# Patient Record
Sex: Female | Born: 1990 | Race: Black or African American | Hispanic: No | Marital: Single | Smoking: Never smoker
Health system: Southern US, Community
[De-identification: ages and names within clinical notes are randomized; demographics above are authoritative.]

## PROBLEM LIST (undated history)

## (undated) ENCOUNTER — Emergency Department (HOSPITAL_BASED_OUTPATIENT_CLINIC_OR_DEPARTMENT_OTHER): Admission: EM | Payer: No Typology Code available for payment source | Source: Home / Self Care

## (undated) DIAGNOSIS — F419 Anxiety disorder, unspecified: Secondary | ICD-10-CM

## (undated) DIAGNOSIS — D649 Anemia, unspecified: Secondary | ICD-10-CM

## (undated) DIAGNOSIS — J40 Bronchitis, not specified as acute or chronic: Secondary | ICD-10-CM

## (undated) HISTORY — PX: NO PAST SURGERIES: SHX2092

## (undated) HISTORY — DX: Anxiety disorder, unspecified: F41.9

---

## 2008-04-26 ENCOUNTER — Emergency Department (HOSPITAL_COMMUNITY): Admission: EM | Admit: 2008-04-26 | Discharge: 2008-04-26 | Payer: Self-pay | Admitting: Emergency Medicine

## 2009-08-05 ENCOUNTER — Emergency Department (HOSPITAL_COMMUNITY): Admission: EM | Admit: 2009-08-05 | Discharge: 2009-08-06 | Payer: Self-pay | Admitting: Emergency Medicine

## 2009-11-20 ENCOUNTER — Emergency Department (HOSPITAL_COMMUNITY): Admission: EM | Admit: 2009-11-20 | Discharge: 2009-11-20 | Payer: Self-pay | Admitting: Emergency Medicine

## 2010-06-26 ENCOUNTER — Emergency Department (HOSPITAL_COMMUNITY)
Admission: EM | Admit: 2010-06-26 | Discharge: 2010-06-26 | Payer: Self-pay | Source: Home / Self Care | Admitting: Emergency Medicine

## 2010-09-12 IMAGING — US US OB TRANSVAGINAL MODIFY
1 series · 14 of 28 positions shown · non-contrast
Comparison: none

CLINICAL DATA: Back pain.

OBSTETRIC <14 WK US AND TRANSVAGINAL OB US
TECHNIQUE: Both transabdominal and transvaginal ultrasound
examinations were performed for complete evaluation of the
gestation as well as the maternal uterus, adnexal regions, and
pelvic cul-de-sac.

[Series 1: us ob transvaginal modify · 0.26mm/px · 14 of 37 slices shown]
[im 2/37]
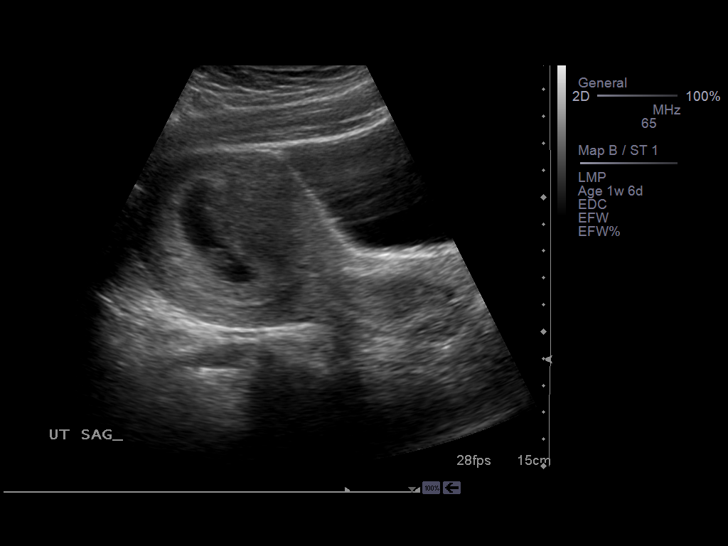
[im 5/37]
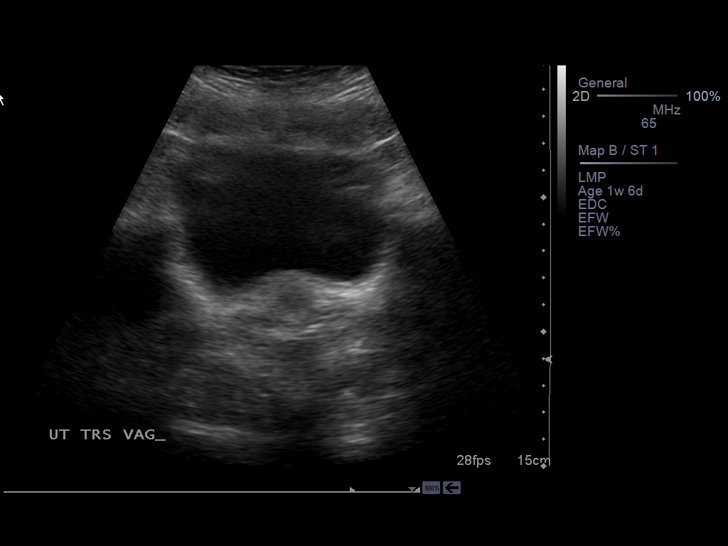
[im 7/37]
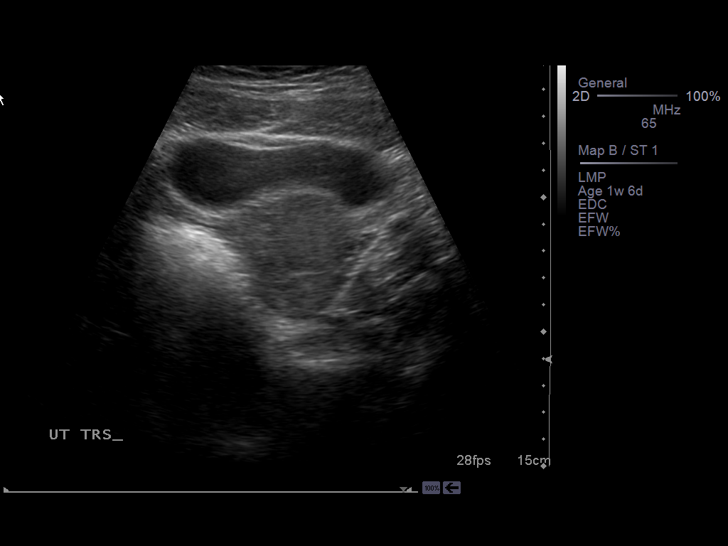
[im 10/37]
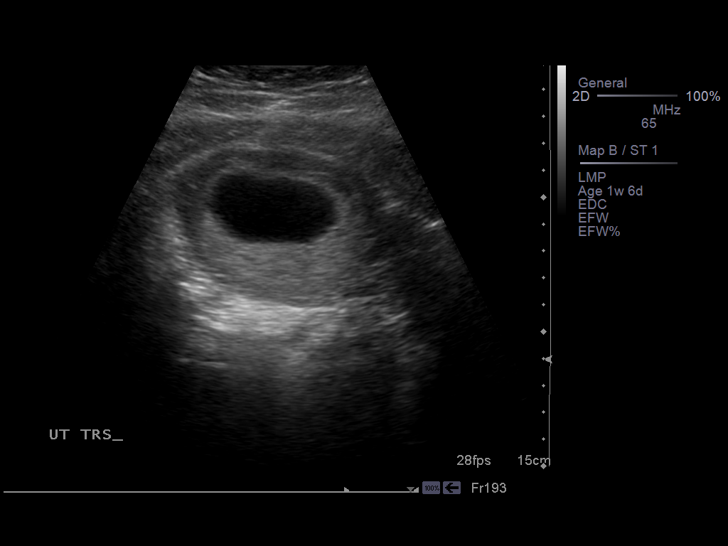
[im 13/37]
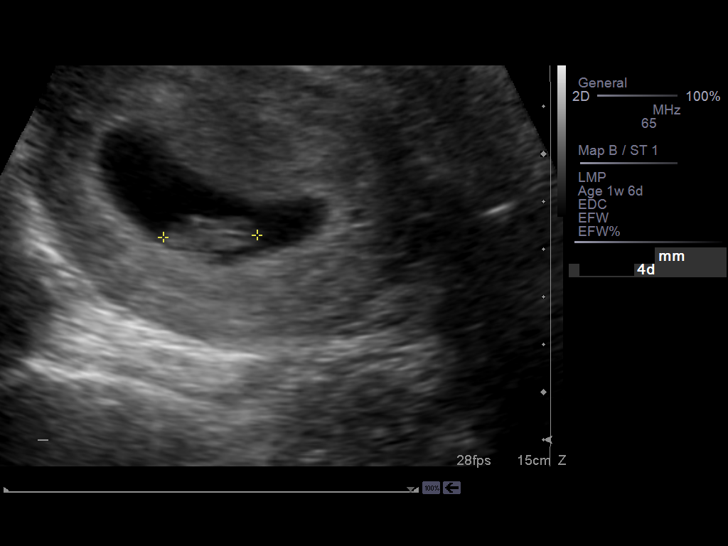
[im 15/37]
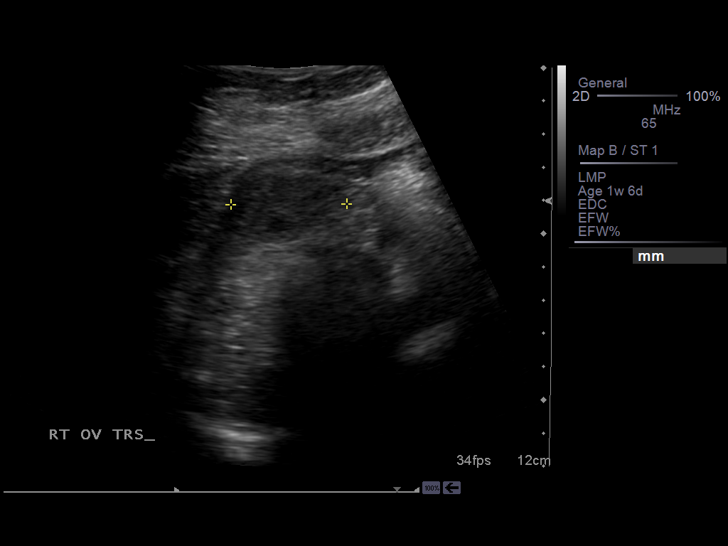
[im 18/37]
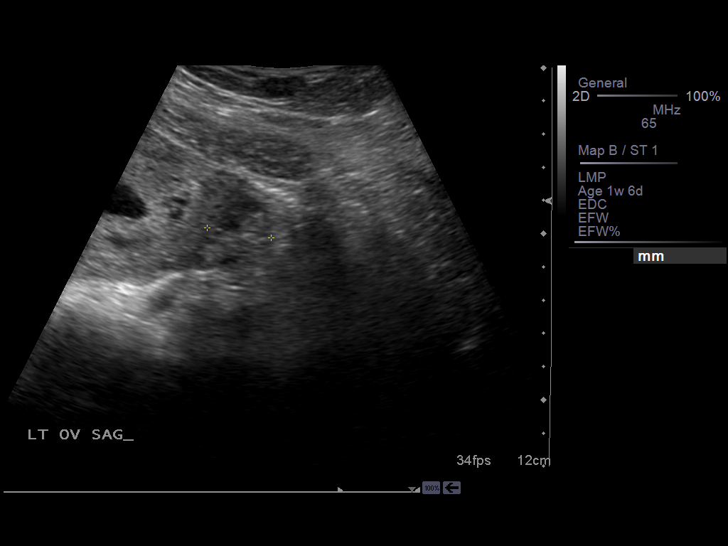
[im 21/37]
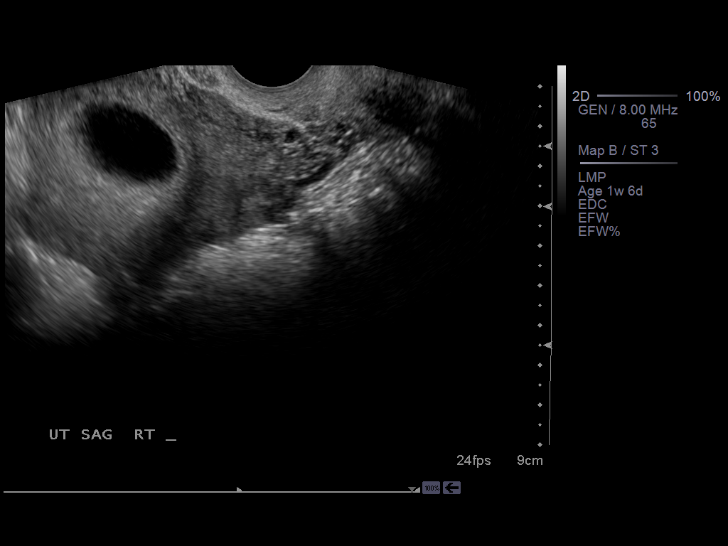
[im 23/37]
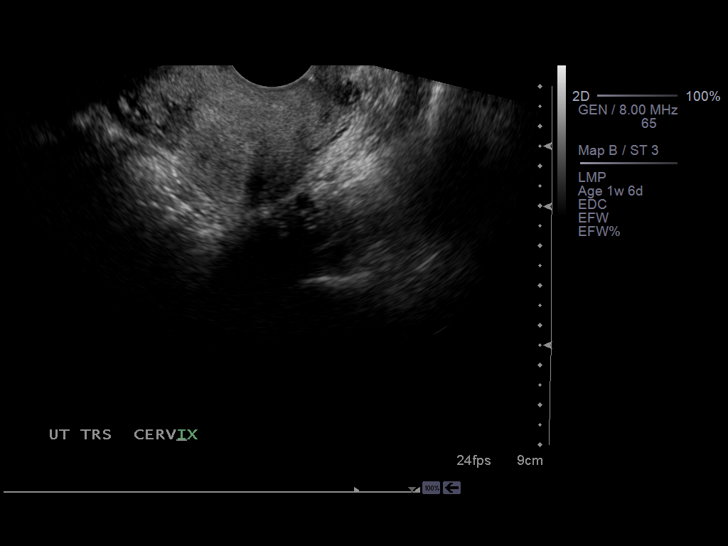
[im 26/37]
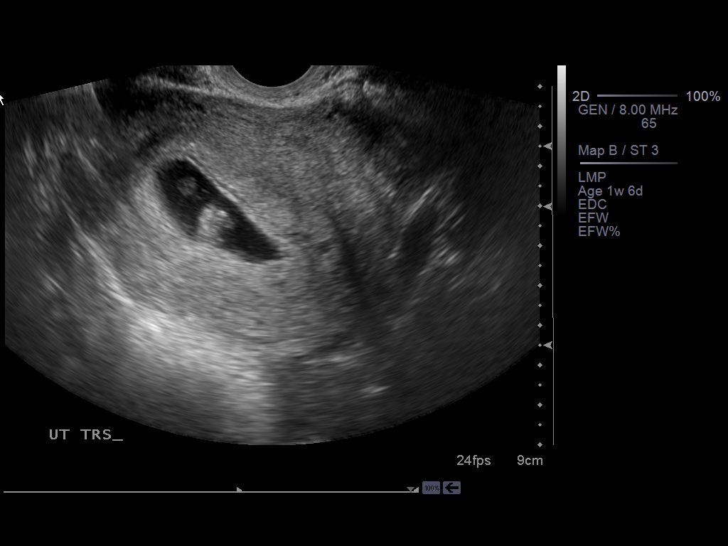
[im 29/37]
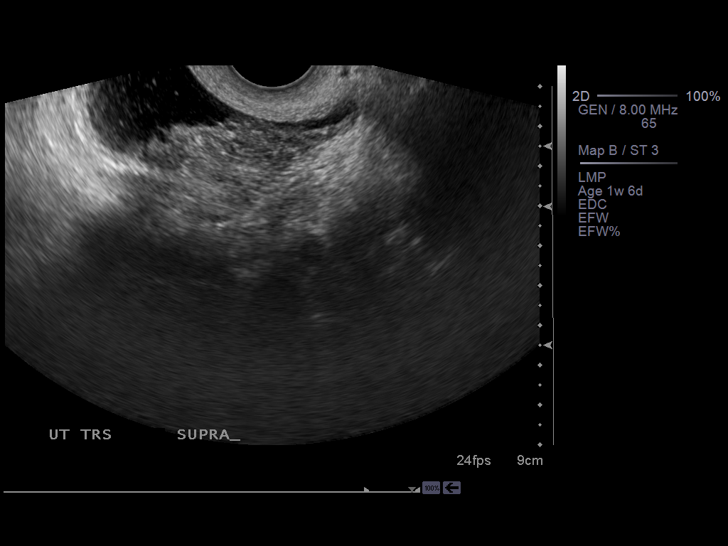
[im 31/37]
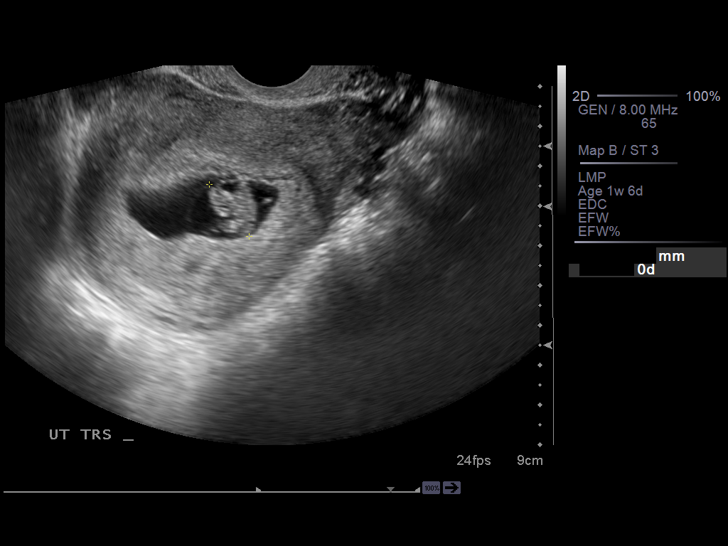
[im 34/37]
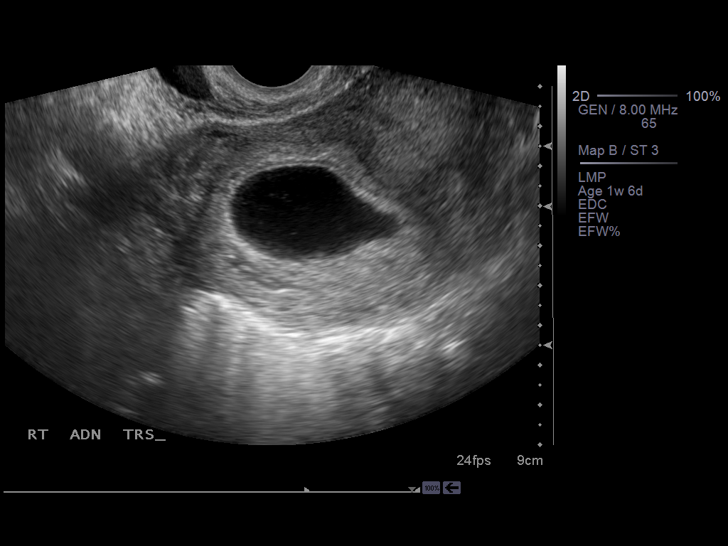
[im 37/37]
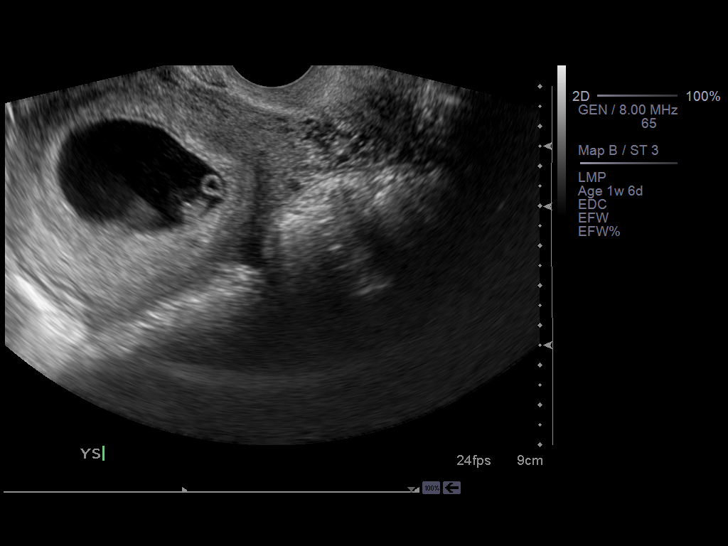

[14 of 28 positions shown; findings below may reference images not displayed]

FINDINGS: Quantitative beta HCG not available.  Single intrauterine
gestational sac is identified with yolk sac, embryo, and cardiac
activity of 180 beats per minute.  Crown-rump length is 17.6 mm,
corresponding with 8 weeks 2 days.

The right ovary measures 4.9 x 2.3 x 3.5 cm.  The left ovary
measures 5.1 x 3.2 x 1.0 cm.

No free fluid is present in the anatomic pelvis.
IMPRESSION: Single viable intrauterine pregnancy with estimated gestational age
8 weeks 2 days. Follow-up fetal 20-week anatomic ultrasound
recommended, or sooner if clinically indicated.

## 2011-03-24 LAB — URINE MICROSCOPIC-ADD ON

## 2011-03-24 LAB — PREGNANCY, URINE

## 2011-03-24 LAB — URINALYSIS, ROUTINE W REFLEX MICROSCOPIC
Bilirubin Urine: NEGATIVE
Glucose, UA: NEGATIVE mg/dL
Hgb urine dipstick: NEGATIVE
Ketones, ur: NEGATIVE mg/dL
Nitrite: NEGATIVE
Protein, ur: NEGATIVE mg/dL
Specific Gravity, Urine: 1.003 — ABNORMAL LOW (ref 1.005–1.030)
Urobilinogen, UA: 1 mg/dL (ref 0.0–1.0)
pH: 7.5 (ref 5.0–8.0)

## 2011-03-24 LAB — RAPID STREP SCREEN (MED CTR MEBANE ONLY): Streptococcus, Group A Screen (Direct): NEGATIVE

## 2011-03-28 LAB — URINALYSIS, ROUTINE W REFLEX MICROSCOPIC
Bilirubin Urine: NEGATIVE
Glucose, UA: NEGATIVE mg/dL
Hgb urine dipstick: NEGATIVE
Ketones, ur: NEGATIVE mg/dL
Nitrite: NEGATIVE
Protein, ur: NEGATIVE mg/dL
Specific Gravity, Urine: 1.008 (ref 1.005–1.030)
Urobilinogen, UA: 1 mg/dL (ref 0.0–1.0)
pH: 7 (ref 5.0–8.0)

## 2011-03-28 LAB — URINE MICROSCOPIC-ADD ON

## 2011-03-28 LAB — HCG, QUANTITATIVE, PREGNANCY

## 2011-03-28 LAB — POCT PREGNANCY, URINE

## 2011-11-27 ENCOUNTER — Encounter: Payer: Self-pay | Admitting: Physical Medicine and Rehabilitation

## 2011-11-27 ENCOUNTER — Emergency Department (HOSPITAL_COMMUNITY)
Admission: EM | Admit: 2011-11-27 | Discharge: 2011-11-28 | Disposition: A | Discharge status: Home / Self Care | Attending: Emergency Medicine | Admitting: Emergency Medicine

## 2011-11-27 DIAGNOSIS — R0602 Shortness of breath: Secondary | ICD-10-CM | POA: Insufficient documentation

## 2011-11-27 DIAGNOSIS — R05 Cough: Secondary | ICD-10-CM | POA: Insufficient documentation

## 2011-11-27 DIAGNOSIS — R070 Pain in throat: Secondary | ICD-10-CM | POA: Insufficient documentation

## 2011-11-27 DIAGNOSIS — R0789 Other chest pain: Secondary | ICD-10-CM | POA: Insufficient documentation

## 2011-11-27 DIAGNOSIS — J40 Bronchitis, not specified as acute or chronic: Secondary | ICD-10-CM

## 2011-11-27 DIAGNOSIS — R059 Cough, unspecified: Secondary | ICD-10-CM | POA: Insufficient documentation

## 2011-11-27 MED ORDER — ALBUTEROL SULFATE HFA 108 (90 BASE) MCG/ACT IN AERS
2.0000 | INHALATION_SPRAY | RESPIRATORY_TRACT | Status: DC | PRN
  Administered 2011-11-28: 2 via RESPIRATORY_TRACT
  Filled 2011-11-27: qty 6.7

## 2011-11-27 MED ORDER — ALBUTEROL SULFATE (5 MG/ML) 0.5% IN NEBU
5.0000 mg | INHALATION_SOLUTION | Freq: Once | RESPIRATORY_TRACT | Status: AC
  Administered 2011-11-28: 5 mg via RESPIRATORY_TRACT
  Filled 2011-11-27: qty 1

## 2011-11-27 MED ORDER — IPRATROPIUM BROMIDE 0.02 % IN SOLN
0.5000 mg | Freq: Once | RESPIRATORY_TRACT | Status: AC
  Administered 2011-11-28: 0.5 mg via RESPIRATORY_TRACT
  Filled 2011-11-27: qty 2.5

## 2011-11-27 NOTE — ED Notes (Signed)
Pt reports that she has had a non-productive cough and slight fever (been taking tylenol) for several days.  Reports that she has also had some lower bilateral abdominal pain x several weeks.  States that she is non-tender on palpation.  Pt not noted to be coughing, breath sounds clear bilaterally.  Skin warm, dry and intact.  Neuro intact.

## 2011-11-27 NOTE — ED Provider Notes (Signed)
History     CSN: [id number] Arrival date & time: 11/27/2011  8:11 PM   First MD Initiated Contact with Patient 11/27/11 2317      Chief Complaint  Patient presents with  . Cough    HPI  History provided by the patient. Patient with past history of asthma presents with complaints of dry nonproductive cough, sore throat and chest tightness this been waxing and waning for the past week. Patient reports having a coworker with similar respiratory symptoms. Patient has tried some over-the-counter cough medicine without any significant improvement. Patient denies any aggravating or alleviating factors. She denies any fever, chills, sweats, chest pain, hemoptysis. Patient denies any nausea vomiting diarrhea. She has no other significant past medical history.  History reviewed. No pertinent past medical history.  History reviewed. No pertinent past surgical history.  History reviewed. No pertinent family history.  History  Substance Use Topics  . Smoking status: Never Smoker   . Smokeless tobacco: Not on file  . Alcohol Use: Yes    OB History    Grav Para Term Preterm Abortions TAB SAB Ect Mult Living                  Review of Systems  Constitutional: Negative for fever, chills and diaphoresis.  HENT: Positive for sore throat. Negative for congestion and rhinorrhea.   Respiratory: Positive for cough, chest tightness, shortness of breath and wheezing.   Cardiovascular: Negative for chest pain.  Gastrointestinal: Negative for nausea, vomiting, diarrhea and constipation.  Genitourinary: Negative for dysuria, hematuria and flank pain.  Neurological: Negative for light-headedness.  All other systems reviewed and are negative.    Allergies  Keflex  Home Medications  No current outpatient prescriptions on file.  BP 105/65  Pulse 86  Temp(Src) 99 F (37.2 C) (Oral)  Resp 18  SpO2 98%  Physical Exam  Nursing note and vitals reviewed. Constitutional: She is oriented to  person, place, and time. She appears well-developed and well-nourished. No distress.  HENT:  Head: Normocephalic.  Mouth/Throat: Oropharynx is clear and moist.  Eyes: Conjunctivae and EOM are normal. Pupils are equal, round, and reactive to light.  Neck: Normal range of motion. Neck supple.  Cardiovascular: Normal rate, regular rhythm and normal heart sounds.   Pulmonary/Chest: Effort normal. No respiratory distress. She has wheezes. She has no rales.  Abdominal: Soft. Bowel sounds are normal. There is no tenderness. There is no rebound and no guarding.  Musculoskeletal: She exhibits no edema and no tenderness.  Lymphadenopathy:    She has no cervical adenopathy.  Neurological: She is alert and oriented to person, place, and time.  Skin: Skin is warm and dry.  Psychiatric: She has a normal mood and affect. Her behavior is normal.    ED Course  Procedures (including critical care time)   1. Bronchitis      MDM  11:50 PM patient seen and evaluated. Patient in no acute distress. Patient with normal respirations and good O2 sats. Patient is PERC negative        Angus Seller, [place] 11/28/11 1850

## 2011-11-27 NOTE — ED Notes (Signed)
Pt presents to department for evaluation of cough and fever. Ongoing for several days. Also states chest congestion. Respirations unlabored. Skin warm and dry. No signs of distress at the time.

## 2011-11-28 ENCOUNTER — Emergency Department (HOSPITAL_COMMUNITY)
Admission: EM | Admit: 2011-11-28 | Discharge: 2011-11-28 | Disposition: A | Discharge status: Home / Self Care | Attending: Emergency Medicine | Admitting: Emergency Medicine

## 2011-11-28 ENCOUNTER — Encounter (HOSPITAL_COMMUNITY): Payer: Self-pay | Admitting: *Deleted

## 2011-11-28 ENCOUNTER — Encounter (HOSPITAL_COMMUNITY): Payer: Self-pay | Admitting: Nurse Practitioner

## 2011-11-28 DIAGNOSIS — J40 Bronchitis, not specified as acute or chronic: Secondary | ICD-10-CM | POA: Insufficient documentation

## 2011-11-28 DIAGNOSIS — J45909 Unspecified asthma, uncomplicated: Secondary | ICD-10-CM | POA: Insufficient documentation

## 2011-11-28 DIAGNOSIS — R0989 Other specified symptoms and signs involving the circulatory and respiratory systems: Secondary | ICD-10-CM | POA: Insufficient documentation

## 2011-11-28 DIAGNOSIS — R05 Cough: Secondary | ICD-10-CM | POA: Insufficient documentation

## 2011-11-28 DIAGNOSIS — R059 Cough, unspecified: Secondary | ICD-10-CM | POA: Insufficient documentation

## 2011-11-28 DIAGNOSIS — R509 Fever, unspecified: Secondary | ICD-10-CM | POA: Insufficient documentation

## 2011-11-28 HISTORY — DX: Bronchitis, not specified as acute or chronic: J40

## 2011-11-28 HISTORY — DX: Anemia, unspecified: D64.9

## 2011-11-28 MED ORDER — AZITHROMYCIN 250 MG PO TABS: ORAL_TABLET | ORAL | Status: AC

## 2011-11-28 NOTE — ED Provider Notes (Signed)
History     CSN: [id number] Arrival date & time: 11/28/2011  9:53 AM   First MD Initiated Contact with Patient 11/28/11 1057      Chief Complaint  Patient presents with  . URI    (Consider location/radiation/quality/duration/timing/severity/associated sxs/prior treatment) Patient is a 20 y.o. female presenting with URI. The history is provided by the patient.  URI The primary symptoms include fever and cough. Primary symptoms do not include headaches, abdominal pain or vomiting.  The illness is not associated with rhinorrhea.   Pt notes hx asthma and bronchitis. States in past week, prod cough, occasional wheezing. No current sob. No chest pain. Notes fevers. No chills/sweats. States w coughing spells has scratchy throat, otherwise no sore throat. No nasal drainage or sinus pain. No headache. No known ill contacts. Seen in ed yesterday, states fever and cough persist. No hx [place].  Past Medical History  Diagnosis Date  . Asthma   . Anemia   . Bronchitis     History reviewed. No pertinent past surgical history.  History reviewed. No pertinent family history.  History  Substance Use Topics  . Smoking status: Never Smoker   . Smokeless tobacco: Not on file  . Alcohol Use: Yes     rare    OB History    Grav Para Term Preterm Abortions TAB SAB Ect Mult Living                  Review of Systems  Constitutional: Positive for fever.  HENT: Negative for rhinorrhea.   Respiratory: Positive for cough.   Cardiovascular: Negative for chest pain and leg swelling.  Gastrointestinal: Negative for vomiting, abdominal pain and diarrhea.  Genitourinary: Negative for dysuria.  Musculoskeletal: Negative for back pain.  Neurological: Negative for headaches.  Hematological: Negative for adenopathy.    Allergies  Keflex  Home Medications  No current outpatient prescriptions on file.  BP 109/63  Pulse 97  Temp(Src) 98.5 F (36.9 C) (Oral)  Resp 18  Ht 5\' 5"  (1.651 m)  Wt 201  lb (91.173 kg)  BMI 33.45 kg/m2  SpO2 96%  Physical Exam  Nursing note and vitals reviewed. Constitutional: She is oriented to person, place, and time. She appears well-developed and well-nourished. No distress.  HENT:  Right Ear: External ear normal.  Left Ear: External ear normal.  Nose: Nose normal.  Mouth/Throat: Oropharynx is clear and moist.  Eyes: Conjunctivae are normal. No scleral icterus.  Neck: Neck supple. No tracheal deviation present.  Cardiovascular: Normal rate, regular rhythm, normal heart sounds and intact distal pulses.  Exam reveals no gallop and no friction rub.   No murmur heard. Pulmonary/Chest: Effort normal. No respiratory distress. She has no wheezes.       Coughing, upper resp congestion  Abdominal: Soft. Normal appearance. She exhibits no distension. There is no tenderness.  Musculoskeletal: She exhibits no edema and no tenderness.  Lymphadenopathy:    She has no cervical adenopathy.  Neurological: She is alert and oriented to person, place, and time.  Skin: Skin is warm and dry. No rash noted.  Psychiatric: She has a normal mood and affect.    ED Course  Procedures (including critical care time)     MDM  Will rx bronchitis. Confirmed only allergy is keflex. Has inhaler at home.        Suzi Roots, MD 11/28/11 [phone]

## 2011-11-28 NOTE — ED Notes (Signed)
Pt reports sob, chest congestion, body aches, chills since Monday. Pt came last night and was diagnosed with bronchitis but states she is not feeling any better and feels she may need antibiotics. A&Ox4, breathing easily

## 2011-11-29 NOTE — ED Provider Notes (Signed)
Medical screening examination/treatment/procedure(s) were performed by non-physician practitioner and as supervising physician I was immediately available for consultation/collaboration.   Glynn Octave, MD 11/29/11 [phone]

## 2011-12-05 ENCOUNTER — Inpatient Hospital Stay (HOSPITAL_COMMUNITY)
Admission: AD | Admit: 2011-12-05 | Discharge: 2011-12-05 | Disposition: A | Discharge status: Home / Self Care | Source: Ambulatory Visit | Attending: Obstetrics and Gynecology | Admitting: Obstetrics and Gynecology

## 2011-12-05 ENCOUNTER — Inpatient Hospital Stay (HOSPITAL_COMMUNITY)

## 2011-12-05 ENCOUNTER — Encounter (HOSPITAL_COMMUNITY): Payer: Self-pay | Admitting: *Deleted

## 2011-12-05 DIAGNOSIS — O26899 Other specified pregnancy related conditions, unspecified trimester: Secondary | ICD-10-CM

## 2011-12-05 DIAGNOSIS — N949 Unspecified condition associated with female genital organs and menstrual cycle: Secondary | ICD-10-CM

## 2011-12-05 DIAGNOSIS — R1032 Left lower quadrant pain: Secondary | ICD-10-CM | POA: Insufficient documentation

## 2011-12-05 DIAGNOSIS — R102 Pelvic and perineal pain: Secondary | ICD-10-CM

## 2011-12-05 DIAGNOSIS — O99891 Other specified diseases and conditions complicating pregnancy: Secondary | ICD-10-CM | POA: Insufficient documentation

## 2011-12-05 LAB — CBC
HCT: 34.8 % — ABNORMAL LOW (ref 36.0–46.0)
Hemoglobin: 12.1 g/dL (ref 12.0–15.0)
MCH: 27.5 pg (ref 26.0–34.0)
MCHC: 34.8 g/dL (ref 30.0–36.0)
MCV: 79.1 fL (ref 78.0–100.0)
Platelets: 289 10*3/uL (ref 150–400)
RBC: 4.4 MIL/uL (ref 3.87–5.11)
RDW: 14.6 % (ref 11.5–15.5)
WBC: 13.9 10*3/uL — ABNORMAL HIGH (ref 4.0–10.5)

## 2011-12-05 LAB — WET PREP, GENITAL
Clue Cells Wet Prep HPF POC: NONE SEEN
Trich, Wet Prep: NONE SEEN
Yeast Wet Prep HPF POC: NONE SEEN

## 2011-12-05 LAB — ABO/RH: ABO/RH(D): A POS

## 2011-12-05 LAB — HCG, QUANTITATIVE, PREGNANCY: hCG, Beta Chain, Quant, S: 361 m[IU]/mL — ABNORMAL HIGH (ref <5)

## 2011-12-05 NOTE — ED Notes (Signed)
Wet prep obtain by RN; no speculum used. Pt tolerated well. Sent to the Lab.

## 2011-12-05 NOTE — ED Provider Notes (Signed)
History     Chief Complaint  Patient presents with  . Abdominal Pain   HPI 20 y.o. G2P0010 at [redacted]w[redacted]d sent from Urgent Care for r/o ectopic, c/o LLQ pain, no bleeding + UPT, pelvic exam done at Urgent Care.    Past Medical History  Diagnosis Date  . Asthma   . Anemia   . Bronchitis     Past Surgical History  Procedure Date  . No past surgeries     Family History  Problem Relation Age of Onset  . Diabetes Father     History  Substance Use Topics  . Smoking status: Never Smoker   . Smokeless tobacco: Not on file  . Alcohol Use: No     rare    Allergies:  Allergies  Allergen Reactions  . Keflex Hives and Other (See Comments)    Hyperventilate     Prescriptions prior to admission  Medication Sig Dispense Refill  . levofloxacin (LEVAQUIN) 500 MG tablet Take 500 mg by mouth daily.        . predniSONE (DELTASONE) 50 MG tablet Take 50 mg by mouth daily.          Review of Systems  Constitutional: Negative.   Respiratory: Negative.   Cardiovascular: Negative.   Gastrointestinal: Positive for abdominal pain. Negative for nausea, vomiting, diarrhea and constipation.  Genitourinary: Negative for dysuria, urgency, frequency, hematuria and flank pain.       Negative for vaginal bleeding, vaginal discharge, dyspareunia  Musculoskeletal: Negative.   Neurological: Negative.   Psychiatric/Behavioral: Negative.    Physical Exam   Blood pressure 114/66, pulse 93, temperature 100 F (37.8 C), temperature source Oral, resp. rate 18, height 5\' 5"  (1.651 m), weight 213 lb 9.6 oz (96.888 kg), last menstrual period 11/02/2011.  Physical Exam  Nursing note and vitals reviewed. Constitutional: She is oriented to person, place, and time. She appears well-developed and well-nourished. No distress.  Cardiovascular: Normal rate.   Respiratory: Effort normal.  GI: Soft. There is no tenderness.  Musculoskeletal: Normal range of motion.  Neurological: She is alert and oriented to  person, place, and time.  Skin: Skin is warm and dry.  Psychiatric: She has a normal mood and affect.    MAU Course  Procedures  US Ob Comp Less 14 Wks  12/05/2011  *RADIOLOGY REPORT*  Clinical Data: Early pregnancy.  Abdominal pain.  Pelvic pain left greater than right.  By LMP, the patient is 4 weeks 5 days.  By LMP, EDC is 08/08/2012.  Quantitative beta HCG is 361.  OBSTETRIC <14 WK [place] AND TRANSVAGINAL OB US  Technique:  Both transabdominal and transvaginal ultrasound examinations were performed for complete evaluation of the gestation as well as the maternal uterus, adnexal regions, and pelvic cul-de-sac.  Transvaginal technique was performed to assess early pregnancy.  Comparison:  None  Intrauterine gestational sac:  None Yolk sac: None Embryo: None Cardiac Activity: None  Maternal uterus/adnexae: The endometrial lining is thickened, 14 mm.  Ovaries have a normal appearance.  Left ovary contains a corpus luteum cyst.  No free pelvic fluid identified.  IMPRESSION:  1.  Thickened endometrial stripe, consistent with decidual reaction. 2.  No evidence for intrauterine or ectopic pregnancy at this time. Follow-up serial quantitative beta HCGs is recommended.  Follow-up ultrasound is recommended to document presence and location of pregnancy and for dating purposes.  Original Report Authenticated By: Patterson Hammersmith, [name]   US Ob Transvaginal  12/05/2011  *RADIOLOGY REPORT*  Clinical Data: Early  pregnancy.  Abdominal pain.  Pelvic pain left greater than right.  By LMP, the patient is 4 weeks 5 days.  By LMP, EDC is 08/08/2012.  Quantitative beta HCG is 361.  OBSTETRIC <14 WK [place] AND TRANSVAGINAL OB US  Technique:  Both transabdominal and transvaginal ultrasound examinations were performed for complete evaluation of the gestation as well as the maternal uterus, adnexal regions, and pelvic cul-de-sac.  Transvaginal technique was performed to assess early pregnancy.  Comparison:  None  Intrauterine  gestational sac:  None Yolk sac: None Embryo: None Cardiac Activity: None  Maternal uterus/adnexae: The endometrial lining is thickened, 14 mm.  Ovaries have a normal appearance.  Left ovary contains a corpus luteum cyst.  No free pelvic fluid identified.  IMPRESSION:  1.  Thickened endometrial stripe, consistent with decidual reaction. 2.  No evidence for intrauterine or ectopic pregnancy at this time. Follow-up serial quantitative beta HCGs is recommended.  Follow-up ultrasound is recommended to document presence and location of pregnancy and for dating purposes.  Original Report Authenticated By: Patterson Hammersmith, [name]   Quant HCG: 361  Assessment and Plan  20 y.o. G2P0010 at [redacted]w[redacted]d with abd pain F/U in 48 hours for repeat quant  Haidyn Chadderdon 12/05/2011, 5:18 PM

## 2011-12-05 NOTE — Progress Notes (Signed)
Pt c/o Lower left side abd pain x 1 week. Pt went to [facility] and told she was pregnant and referred to MAU by MD there. Denies vaginal bleeding or discharge.

## 2011-12-05 NOTE — Progress Notes (Signed)
Pt presents to [facility] from [facility] with chief complaint of lower abdominal pain/cramping > left side. [facility]; positive pregnancy test, sent to MAU for further evaluation. Pain started 1-2 weeks ago; LMP Nov 02, 2011

## 2011-12-08 ENCOUNTER — Inpatient Hospital Stay (HOSPITAL_COMMUNITY)
Admission: AD | Admit: 2011-12-08 | Discharge: 2011-12-08 | Disposition: A | Discharge status: Home / Self Care | Source: Ambulatory Visit | Attending: Obstetrics & Gynecology | Admitting: Obstetrics & Gynecology

## 2011-12-08 DIAGNOSIS — O26899 Other specified pregnancy related conditions, unspecified trimester: Secondary | ICD-10-CM

## 2011-12-08 DIAGNOSIS — N949 Unspecified condition associated with female genital organs and menstrual cycle: Secondary | ICD-10-CM

## 2011-12-08 DIAGNOSIS — R102 Pelvic and perineal pain: Secondary | ICD-10-CM

## 2011-12-08 DIAGNOSIS — R109 Unspecified abdominal pain: Secondary | ICD-10-CM | POA: Insufficient documentation

## 2011-12-08 DIAGNOSIS — O99891 Other specified diseases and conditions complicating pregnancy: Secondary | ICD-10-CM | POA: Insufficient documentation

## 2011-12-08 LAB — HCG, QUANTITATIVE, PREGNANCY: hCG, Beta Chain, Quant, S: 1125 m[IU]/mL — ABNORMAL HIGH (ref <5)

## 2011-12-08 LAB — GC/CHLAMYDIA PROBE AMP, URINE
Chlamydia, Swab/Urine, PCR: NEGATIVE
GC Probe Amp, Urine: NEGATIVE

## 2011-12-08 NOTE — ED Provider Notes (Signed)
History     No chief complaint on file.  HPI 20 y.o. G2P0010 here for repeat quant, seen on 12/15 for cramping and + UPT, cramping continues, no bleeding.    Past Medical History  Diagnosis Date  . Asthma   . Anemia   . Bronchitis     Past Surgical History  Procedure Date  . No past surgeries     Family History  Problem Relation Age of Onset  . Diabetes Father     History  Substance Use Topics  . Smoking status: Never Smoker   . Smokeless tobacco: Not on file  . Alcohol Use: No     rare    Allergies:  Allergies  Allergen Reactions  . Keflex Hives and Other (See Comments)    Hyperventilate     Prescriptions prior to admission  Medication Sig Dispense Refill  . levofloxacin (LEVAQUIN) 500 MG tablet Take 500 mg by mouth daily.        . predniSONE (DELTASONE) 50 MG tablet Take 50 mg by mouth daily.          Review of Systems  Constitutional: Negative.   Respiratory: Negative.   Cardiovascular: Negative.   Gastrointestinal: Positive for abdominal pain. Negative for nausea, vomiting, diarrhea and constipation.  Genitourinary: Negative for dysuria, urgency, frequency, hematuria and flank pain.       Negative for vaginal bleeding, vaginal discharge  Musculoskeletal: Negative.   Neurological: Negative.   Psychiatric/Behavioral: Negative.    Physical Exam   Blood pressure 115/70, pulse 96, resp. rate 20, last menstrual period 11/02/2011.  Physical Exam  Nursing note and vitals reviewed. Constitutional: She is oriented to person, place, and time. She appears well-developed and well-nourished. No distress.  Cardiovascular: Normal rate.   Respiratory: Effort normal.  Musculoskeletal: Normal range of motion.  Neurological: She is alert and oriented to person, place, and time.  Skin: Skin is warm and dry.  Psychiatric: She has a normal mood and affect.    MAU Course  Procedures  12/15 - quant HCG 361  Results for orders placed during the hospital  encounter of 12/08/11 (from the past 72 hour(s))  HCG, QUANTITATIVE, PREGNANCY     Status: Abnormal   Collection Time   12/08/11  8:34 PM      Component Value Range Comment   hCG, Beta Chain, Quant, S 1125 (*) <5 (mIU/mL)      Assessment and Plan  20 y.o. G2P0010 with cramping in early pregnancy Appropriate rise in HCG - f/u 1 week for repeat u/s Precautions rev'd  Umair Rosiles 12/08/2011, 9:28 PM

## 2011-12-08 NOTE — ED Provider Notes (Signed)
Agree with above note.  Cynthia Little 12/08/2011 1:18 PM

## 2011-12-16 ENCOUNTER — Inpatient Hospital Stay (HOSPITAL_COMMUNITY)
Admission: AD | Admit: 2011-12-16 | Discharge: 2011-12-16 | Disposition: A | Discharge status: Home / Self Care | Source: Ambulatory Visit | Attending: Obstetrics & Gynecology | Admitting: Obstetrics & Gynecology

## 2011-12-16 ENCOUNTER — Inpatient Hospital Stay (HOSPITAL_COMMUNITY)

## 2011-12-16 DIAGNOSIS — O99891 Other specified diseases and conditions complicating pregnancy: Secondary | ICD-10-CM | POA: Insufficient documentation

## 2011-12-16 DIAGNOSIS — Z3490 Encounter for supervision of normal pregnancy, unspecified, unspecified trimester: Secondary | ICD-10-CM

## 2011-12-16 DIAGNOSIS — O365990 Maternal care for other known or suspected poor fetal growth, unspecified trimester, not applicable or unspecified: Secondary | ICD-10-CM | POA: Insufficient documentation

## 2011-12-16 DIAGNOSIS — Z1389 Encounter for screening for other disorder: Secondary | ICD-10-CM

## 2011-12-16 MED ORDER — PRENATAL RX 60-1 MG PO TABS: 1.0000 | ORAL_TABLET | Freq: Every day | ORAL | Status: DC

## 2011-12-16 NOTE — Progress Notes (Signed)
Pt presents because she was told to return in one week for an ultrasound. Was seen last 12/18, had repeat bhcg done that day 1100. Denies bleeding or pain today.

## 2011-12-16 NOTE — ED Provider Notes (Signed)
History     Chief Complaint  Patient presents with  . Follow-up   HPI This is a 20 y.o. at [redacted]w[redacted]d who presents for followup US after one that was done on 12/05/11 which showed IUGS but no contents. No bleeding or pain. 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  Very happy about pregnancy.    OB History    Grav Para Term Preterm Abortions TAB SAB Ect Mult Living   2 0 0 0 1 0 1 0 0 0       Past Medical History  Diagnosis Date  . Asthma   . Anemia   . Bronchitis     Past Surgical History  Procedure Date  . No past surgeries     Family History  Problem Relation Age of Onset  . Diabetes Father     History  Substance Use Topics  . Smoking status: Never Smoker   . Smokeless tobacco: Not on file  . Alcohol Use: No     rare    Allergies:  Allergies  Allergen Reactions  . Keflex Hives and Other (See Comments)    Hyperventilate     Prescriptions prior to admission  Medication Sig Dispense Refill  . levofloxacin (LEVAQUIN) 500 MG tablet Take 500 mg by mouth daily.        . predniSONE (DELTASONE) 50 MG tablet Take 50 mg by mouth daily.          ROS See above  Physical Exam   Blood pressure 107/66, pulse 83, temperature 99.2 F (37.3 C), resp. rate 16, height 5\' 5"  (1.651 m), weight 217 lb (98.431 kg), last menstrual period 11/02/2011.  Physical Exam Deferred.  US Ob Comp Less 14 Wks  12/05/2011  *RADIOLOGY REPORT*  Clinical Data: Early pregnancy.  Abdominal pain.  Pelvic pain left greater than right.  By LMP, the patient is 4 weeks 5 days.  By LMP, EDC is 08/08/2012.  Quantitative beta HCG is 361.  OBSTETRIC <14 WK [place] AND TRANSVAGINAL OB US  Technique:  Both transabdominal and transvaginal ultrasound examinations were performed for complete evaluation of the gestation as well as the maternal uterus, adnexal regions, and pelvic cul-de-sac.  Transvaginal technique was performed to assess early pregnancy.  Comparison:  None  Intrauterine gestational  sac:  None Yolk sac: None Embryo: None Cardiac Activity: None  Maternal uterus/adnexae: The endometrial lining is thickened, 14 mm.  Ovaries have a normal appearance.  Left ovary contains a corpus luteum cyst.  No free pelvic fluid identified.    IMPRESSION:  1.  Thickened endometrial stripe, consistent with decidual reaction. 2.  No evidence for intrauterine or ectopic pregnancy at this time. Follow-up serial quantitative beta HCGs is recommended.  Follow-up ultrasound is recommended to document presence and location of pregnancy and for dating purposes.    Original Report Authenticated By: Patterson Hammersmith, [name]   US Ob Transvaginal  12/16/2011  *RADIOLOGY REPORT*  Clinical Data: Follow-up for viability; no intrauterine or ectopic pregnancy seen on the prior ultrasound.  TRANSVAGINAL OBSTETRIC US  Technique:  Transvaginal ultrasound was performed for complete evaluation of the gestation as well as the maternal uterus, adnexal regions, and pelvic cul-de-sac.  Comparison:  Prior ultrasound of pregnancy performed 12/05/2011  Intrauterine gestational sac: Visualized/normal in shape. Yolk sac: Yes Embryo: Yes Cardiac Activity: Yes Heart Rate: 115 bpm  CRL: 4.4 mm           6   w  1   d  [place] EDC: 08/10/2011  Subchorionic hemorrhage: None seen.  Maternal uterus/adnexae: The uterus is otherwise unremarkable in appearance.  The ovaries are within normal limits.  The right ovary measures 3.9 x 1.9 x 2.5 cm, while the left ovary measures 4.4 x 2.6 x 3.0 cm. No suspicious adnexal masses are seen.  There is no definite evidence for ovarian torsion.  There are is a small amount of free fluid within the pelvic cul-de- sac.    IMPRESSION: Single live intrauterine pregnancy, with a crown-rump length of 4.4 mm, corresponding to a gestational age of [redacted] weeks 1 day.  This matches the gestational age of [redacted] weeks 2 days by LMP, and reflects an estimated date of delivery of August 08, 2012.    Original Report  Authenticated By: Tonia Ghent, [name]     MAU Course  Procedures  Assessment and Plan  A:  SIUP at [redacted]w[redacted]d       P:  Reassured patient      Discussed importance of Prenatal care     Start PNV  [facility] 12/16/2011, 9:33 PM

## 2012-02-02 LAB — OB RESULTS CONSOLE RPR: RPR: NONREACTIVE

## 2012-02-02 LAB — OB RESULTS CONSOLE GC/CHLAMYDIA
Chlamydia: NEGATIVE
Gonorrhea: NEGATIVE

## 2012-02-02 LAB — OB RESULTS CONSOLE HEPATITIS B SURFACE ANTIGEN: Hepatitis B Surface Ag: NEGATIVE

## 2012-03-08 LAB — OB RESULTS CONSOLE HIV ANTIBODY (ROUTINE TESTING): HIV: NONREACTIVE

## 2012-03-08 LAB — OB RESULTS CONSOLE ABO/RH: RH Type: POSITIVE

## 2012-03-08 LAB — OB RESULTS CONSOLE RUBELLA ANTIBODY, IGM: Rubella: IMMUNE

## 2012-03-08 LAB — OB RESULTS CONSOLE HEPATITIS B SURFACE ANTIGEN: Hepatitis B Surface Ag: NEGATIVE

## 2012-03-08 LAB — OB RESULTS CONSOLE ANTIBODY SCREEN: Antibody Screen: NEGATIVE

## 2012-03-08 LAB — OB RESULTS CONSOLE RPR: RPR: NONREACTIVE

## 2012-07-13 LAB — OB RESULTS CONSOLE GBS: GBS: NEGATIVE

## 2012-08-10 ENCOUNTER — Other Ambulatory Visit: Payer: Self-pay | Admitting: Obstetrics and Gynecology

## 2012-08-11 ENCOUNTER — Encounter (HOSPITAL_COMMUNITY): Payer: Self-pay | Admitting: Anesthesiology

## 2012-08-11 ENCOUNTER — Inpatient Hospital Stay (HOSPITAL_COMMUNITY): Payer: Managed Care, Other (non HMO) | Admitting: Anesthesiology

## 2012-08-11 ENCOUNTER — Inpatient Hospital Stay (HOSPITAL_COMMUNITY)
Admission: RE | Admit: 2012-08-11 | Discharge: 2012-08-14 | DRG: 766 | Disposition: A | Discharge status: Home / Self Care | Payer: Managed Care, Other (non HMO) | Source: Ambulatory Visit | Attending: Obstetrics and Gynecology | Admitting: Obstetrics and Gynecology

## 2012-08-11 ENCOUNTER — Encounter (HOSPITAL_COMMUNITY): Payer: Self-pay

## 2012-08-11 ENCOUNTER — Encounter (HOSPITAL_COMMUNITY)
Admission: RE | Disposition: A | Discharge status: Home / Self Care | Payer: Self-pay | Source: Ambulatory Visit | Attending: Obstetrics and Gynecology

## 2012-08-11 VITALS — BP 96/58 | HR 82 | Temp 98.5°F | Resp 20 | Ht 65.0 in | Wt 235.0 lb

## 2012-08-11 DIAGNOSIS — D4959 Neoplasm of unspecified behavior of other genitourinary organ: Secondary | ICD-10-CM | POA: Diagnosis present

## 2012-08-11 DIAGNOSIS — D259 Leiomyoma of uterus, unspecified: Secondary | ICD-10-CM | POA: Diagnosis present

## 2012-08-11 DIAGNOSIS — Z98891 History of uterine scar from previous surgery: Secondary | ICD-10-CM

## 2012-08-11 DIAGNOSIS — O34599 Maternal care for other abnormalities of gravid uterus, unspecified trimester: Secondary | ICD-10-CM | POA: Diagnosis present

## 2012-08-11 LAB — RPR: RPR Ser Ql: NONREACTIVE

## 2012-08-11 LAB — CBC
HCT: 36.6 % (ref 36.0–46.0)
Hemoglobin: 12.5 g/dL (ref 12.0–15.0)
MCH: 26.9 pg (ref 26.0–34.0)
MCHC: 34.2 g/dL (ref 30.0–36.0)
MCV: 78.7 fL (ref 78.0–100.0)
Platelets: 171 10*3/uL (ref 150–400)
RBC: 4.65 MIL/uL (ref 3.87–5.11)
RDW: 16.3 % — ABNORMAL HIGH (ref 11.5–15.5)
WBC: 10.9 10*3/uL — ABNORMAL HIGH (ref 4.0–10.5)

## 2012-08-11 SURGERY — Surgical Case
Anesthesia: Regional

## 2012-08-11 MED ORDER — LACTATED RINGERS IV SOLN: 500.0000 mL | Freq: Once | INTRAVENOUS | Status: DC

## 2012-08-11 MED ORDER — FENTANYL 2.5 MCG/ML BUPIVACAINE 1/10 % EPIDURAL INFUSION (WH - ANES)
14.0000 mL/h | INTRAMUSCULAR | Status: DC
  Filled 2012-08-11: qty 60

## 2012-08-11 MED ORDER — LACTATED RINGERS IV SOLN
500.0000 mL | INTRAVENOUS | Status: DC | PRN
  Administered 2012-08-11: 500 mL via INTRAVENOUS

## 2012-08-11 MED ORDER — GENTAMICIN SULFATE 40 MG/ML IJ SOLN
Freq: Once | INTRAVENOUS | Status: DC
  Filled 2012-08-11: qty 2.5

## 2012-08-11 MED ORDER — MEPERIDINE HCL 25 MG/ML IJ SOLN
6.2500 mg | INTRAMUSCULAR | Status: DC | PRN
  Administered 2012-08-11: 12.5 mg via INTRAVENOUS

## 2012-08-11 MED ORDER — SCOPOLAMINE 1 MG/3DAYS TD PT72
1.0000 | MEDICATED_PATCH | Freq: Once | TRANSDERMAL | Status: DC
  Administered 2012-08-11: 1.5 mg via TRANSDERMAL

## 2012-08-11 MED ORDER — LIDOCAINE-EPINEPHRINE (PF) 2 %-1:200000 IJ SOLN
INTRAMUSCULAR | Status: AC
  Filled 2012-08-11: qty 20

## 2012-08-11 MED ORDER — MORPHINE SULFATE (PF) 0.5 MG/ML IJ SOLN
INTRAMUSCULAR | Status: DC | PRN
  Administered 2012-08-11: 4 mg via EPIDURAL

## 2012-08-11 MED ORDER — GENTAMICIN SULFATE 40 MG/ML IJ SOLN
INTRAVENOUS | Status: DC | PRN
  Administered 2012-08-11: 108.5 mL via INTRAVENOUS

## 2012-08-11 MED ORDER — SODIUM BICARBONATE 8.4 % IV SOLN
INTRAVENOUS | Status: DC | PRN
  Administered 2012-08-11: 4 mL via EPIDURAL

## 2012-08-11 MED ORDER — OXYTOCIN 10 UNIT/ML IJ SOLN
40.0000 [IU] | INTRAVENOUS | Status: DC | PRN
  Administered 2012-08-11: 40 [IU] via INTRAVENOUS

## 2012-08-11 MED ORDER — HYDROMORPHONE HCL PF 1 MG/ML IJ SOLN: 0.2500 mg | INTRAMUSCULAR | Status: DC | PRN

## 2012-08-11 MED ORDER — PHENYLEPHRINE 40 MCG/ML (10ML) SYRINGE FOR IV PUSH (FOR BLOOD PRESSURE SUPPORT)
PREFILLED_SYRINGE | INTRAVENOUS | Status: AC
  Filled 2012-08-11: qty 20

## 2012-08-11 MED ORDER — PHENYLEPHRINE HCL 10 MG/ML IJ SOLN
INTRAMUSCULAR | Status: DC | PRN
  Administered 2012-08-11 (×4): 80 ug via INTRAVENOUS
  Administered 2012-08-11: 40 ug via INTRAVENOUS
  Administered 2012-08-11 (×3): 80 ug via INTRAVENOUS

## 2012-08-11 MED ORDER — LIDOCAINE HCL (PF) 1 % IJ SOLN: 30.0000 mL | INTRAMUSCULAR | Status: DC | PRN

## 2012-08-11 MED ORDER — OXYTOCIN 10 UNIT/ML IJ SOLN
INTRAMUSCULAR | Status: AC
  Filled 2012-08-11: qty 4

## 2012-08-11 MED ORDER — PHENYLEPHRINE 40 MCG/ML (10ML) SYRINGE FOR IV PUSH (FOR BLOOD PRESSURE SUPPORT)
80.0000 ug | PREFILLED_SYRINGE | INTRAVENOUS | Status: DC | PRN
  Filled 2012-08-11: qty 5

## 2012-08-11 MED ORDER — KETOROLAC TROMETHAMINE 30 MG/ML IJ SOLN
INTRAMUSCULAR | Status: AC
  Administered 2012-08-11: 30 mg via INTRAVENOUS
  Filled 2012-08-11: qty 1

## 2012-08-11 MED ORDER — LACTATED RINGERS IV SOLN
INTRAVENOUS | Status: DC
  Administered 2012-08-11: 1000 mL via INTRAVENOUS
  Administered 2012-08-11: 950 mL via INTRAVENOUS

## 2012-08-11 MED ORDER — SCOPOLAMINE 1 MG/3DAYS TD PT72
MEDICATED_PATCH | TRANSDERMAL | Status: AC
  Administered 2012-08-11: 1.5 mg via TRANSDERMAL
  Filled 2012-08-11: qty 1

## 2012-08-11 MED ORDER — OXYCODONE-ACETAMINOPHEN 5-325 MG PO TABS: 1.0000 | ORAL_TABLET | ORAL | Status: DC | PRN

## 2012-08-11 MED ORDER — EPHEDRINE 5 MG/ML INJ
10.0000 mg | INTRAVENOUS | Status: DC | PRN
  Filled 2012-08-11: qty 4

## 2012-08-11 MED ORDER — MEPERIDINE HCL 25 MG/ML IJ SOLN
INTRAMUSCULAR | Status: AC
  Filled 2012-08-11: qty 1

## 2012-08-11 MED ORDER — ONDANSETRON HCL 4 MG/2ML IJ SOLN
INTRAMUSCULAR | Status: DC | PRN
  Administered 2012-08-11: 4 mg via INTRAVENOUS

## 2012-08-11 MED ORDER — BUTORPHANOL TARTRATE 1 MG/ML IJ SOLN
1.0000 mg | Freq: Once | INTRAMUSCULAR | Status: AC
  Administered 2012-08-11: 1 mg via INTRAVENOUS
  Filled 2012-08-11: qty 1

## 2012-08-11 MED ORDER — MEPERIDINE HCL 25 MG/ML IJ SOLN: 6.2500 mg | INTRAMUSCULAR | Status: DC | PRN

## 2012-08-11 MED ORDER — MORPHINE SULFATE 0.5 MG/ML IJ SOLN
INTRAMUSCULAR | Status: AC
  Filled 2012-08-11: qty 10

## 2012-08-11 MED ORDER — PROMETHAZINE HCL 25 MG/ML IJ SOLN: 6.2500 mg | INTRAMUSCULAR | Status: DC | PRN

## 2012-08-11 MED ORDER — FENTANYL 2.5 MCG/ML BUPIVACAINE 1/10 % EPIDURAL INFUSION (WH - ANES)
INTRAMUSCULAR | Status: DC | PRN
  Administered 2012-08-11: 14 mL/h via EPIDURAL

## 2012-08-11 MED ORDER — EPHEDRINE 5 MG/ML INJ
10.0000 mg | INTRAVENOUS | Status: AC | PRN
  Administered 2012-08-11 (×2): 10 mg via INTRAVENOUS
  Filled 2012-08-11: qty 4

## 2012-08-11 MED ORDER — LACTATED RINGERS IV SOLN
INTRAVENOUS | Status: DC | PRN
  Administered 2012-08-11 (×4): via INTRAVENOUS

## 2012-08-11 MED ORDER — DIPHENHYDRAMINE HCL 50 MG/ML IJ SOLN: 12.5000 mg | INTRAMUSCULAR | Status: DC | PRN

## 2012-08-11 MED ORDER — PHENYLEPHRINE 40 MCG/ML (10ML) SYRINGE FOR IV PUSH (FOR BLOOD PRESSURE SUPPORT): 80.0000 ug | PREFILLED_SYRINGE | INTRAVENOUS | Status: DC | PRN

## 2012-08-11 MED ORDER — KETOROLAC TROMETHAMINE 30 MG/ML IJ SOLN
15.0000 mg | Freq: Once | INTRAMUSCULAR | Status: AC | PRN
  Administered 2012-08-11: 30 mg via INTRAVENOUS

## 2012-08-11 MED ORDER — SODIUM BICARBONATE 8.4 % IV SOLN
INTRAVENOUS | Status: AC
  Filled 2012-08-11: qty 50

## 2012-08-11 MED ORDER — CITRIC ACID-SODIUM CITRATE 334-500 MG/5ML PO SOLN: 30.0000 mL | Freq: Once | ORAL | Status: DC

## 2012-08-11 MED ORDER — MEPERIDINE HCL 25 MG/ML IJ SOLN
INTRAMUSCULAR | Status: DC | PRN
  Administered 2012-08-11: 12.5 mg via INTRAVENOUS

## 2012-08-11 MED ORDER — KETOROLAC TROMETHAMINE 60 MG/2ML IM SOLN: 60.0000 mg | Freq: Once | INTRAMUSCULAR | Status: AC | PRN

## 2012-08-11 MED ORDER — CITRIC ACID-SODIUM CITRATE 334-500 MG/5ML PO SOLN
ORAL | Status: AC
  Administered 2012-08-11: 30 mL
  Filled 2012-08-11: qty 15

## 2012-08-11 MED ORDER — IBUPROFEN 600 MG PO TABS: 600.0000 mg | ORAL_TABLET | Freq: Four times a day (QID) | ORAL | Status: DC | PRN

## 2012-08-11 MED ORDER — ONDANSETRON HCL 4 MG/2ML IJ SOLN
INTRAMUSCULAR | Status: AC
  Filled 2012-08-11: qty 2

## 2012-08-11 MED ORDER — OXYTOCIN 40 UNITS IN LACTATED RINGERS INFUSION - SIMPLE MED
1.0000 m[IU]/min | INTRAVENOUS | Status: DC
  Administered 2012-08-11: 11 m[IU]/min via INTRAVENOUS
  Administered 2012-08-11: 1 m[IU]/min via INTRAVENOUS
  Administered 2012-08-11: 3 m[IU]/min via INTRAVENOUS
  Administered 2012-08-11: 12 m[IU]/min via INTRAVENOUS
  Administered 2012-08-11: 7 m[IU]/min via INTRAVENOUS
  Administered 2012-08-11: 9 m[IU]/min via INTRAVENOUS
  Filled 2012-08-11: qty 1000

## 2012-08-11 SURGICAL SUPPLY — 32 items
CLOTH BEACON ORANGE TIMEOUT ST (SAFETY) ×2 IMPLANT
CONTAINER PREFILL 10% NBF 15ML (MISCELLANEOUS) IMPLANT
DRESSING TELFA 8X3 (GAUZE/BANDAGES/DRESSINGS) IMPLANT
DRSG COVADERM 4X10 (GAUZE/BANDAGES/DRESSINGS) IMPLANT
DRSG VASELINE 3X18 (GAUZE/BANDAGES/DRESSINGS) ×2 IMPLANT
ELECT REM PT RETURN 9FT ADLT (ELECTROSURGICAL) ×2
ELECTRODE REM PT RTRN 9FT ADLT (ELECTROSURGICAL) ×1 IMPLANT
EXTRACTOR VACUUM KIWI (MISCELLANEOUS) IMPLANT
EXTRACTOR VACUUM M CUP 4 TUBE (SUCTIONS) IMPLANT
GAUZE SPONGE 4X4 12PLY STRL LF (GAUZE/BANDAGES/DRESSINGS) ×4 IMPLANT
GLOVE BIO SURGEON STRL SZ7.5 (GLOVE) ×4 IMPLANT
GOWN PREVENTION PLUS LG XLONG (DISPOSABLE) ×4 IMPLANT
GOWN PREVENTION PLUS XLARGE (GOWN DISPOSABLE) ×2 IMPLANT
KIT ABG SYR 3ML LUER SLIP (SYRINGE) IMPLANT
NEEDLE HYPO 25X5/8 SAFETYGLIDE (NEEDLE) IMPLANT
NS IRRIG 1000ML POUR BTL (IV SOLUTION) ×2 IMPLANT
PACK C SECTION WH (CUSTOM PROCEDURE TRAY) ×2 IMPLANT
PAD ABD 7.5X8 STRL (GAUZE/BANDAGES/DRESSINGS) IMPLANT
PAD OB MATERNITY 4.3X12.25 (PERSONAL CARE ITEMS) IMPLANT
RTRCTR C-SECT PINK 25CM LRG (MISCELLANEOUS) IMPLANT
SLEEVE SCD COMPRESS KNEE MED (MISCELLANEOUS) IMPLANT
STAPLER VISISTAT 35W (STAPLE) IMPLANT
SUT PLAIN 0 NONE (SUTURE) IMPLANT
SUT VIC AB 0 CT1 36 (SUTURE) ×16 IMPLANT
SUT VIC AB 3-0 CTX 36 (SUTURE) ×2 IMPLANT
SUT VIC AB 3-0 SH 27 (SUTURE)
SUT VIC AB 3-0 SH 27X BRD (SUTURE) IMPLANT
SUT VIC AB 4-0 KS 27 (SUTURE) IMPLANT
SUT VICRYL 0 TIES 12 18 (SUTURE) IMPLANT
TOWEL OR 17X24 6PK STRL BLUE (TOWEL DISPOSABLE) ×4 IMPLANT
TRAY FOLEY CATH 14FR (SET/KITS/TRAYS/PACK) ×2 IMPLANT
WATER STERILE IRR 1000ML POUR (IV SOLUTION) ×2 IMPLANT

## 2012-08-11 NOTE — Progress Notes (Signed)
Patient ID: Cynthia Little, female   DOB: Aug 29, 1991, 21 y.o.   MRN: [id number] Pitocin at 9 mu/ minute and contractions q 2-4 minutes. FHR looks good. The pt does not feel her  contractions except for tightening The cervix is 2-3 cm 50% effaced and the vertex is at -2 station AROM produced clear fluid.

## 2012-08-11 NOTE — H&P (Signed)
NAMEARTHURINE, Cynthia Little            ACCOUNT NO.:  [id number]  MEDICAL RECORD NO.:  [id number]  LOCATION:                                 FACILITY:  PHYSICIAN:  [name], [name] DATE OF BIRTH:  1991-01-28  DATE OF ADMISSION:  08/11/2012 DATE OF DISCHARGE:                             HISTORY & PHYSICAL   HISTORY PRESENT ILLNESS:  This is a 21 year old black female, para 0-0-1- 0, gravida 2, [facility] August 08, 2012 based on her last menstrual period. She had an ultrasound at an outside office at 12 weeks and again at 20 weeks.  The ultrasound at 20 weeks and 3 days on March 21, 2012 suggested her due date was August 05, 2012.  The ultrasound done earlier suggested a due date slightly earlier.  The patient has had a relatively uncomplicated prenatal course.  She has had no significant problems and she is admitted now for induction of labor.  Blood group and type is A positive and negative antibody.  Pap smear normal.  Rubella immune.  RPR nonreactive.  Urine culture negative.  Hepatitis B surface antigen negative, HIV negative, GC and Chlamydia negative.  Hemoglobin AA and quad screen was negative.  One hour Glucola 105 group B strep was negative.  The patient is measured somewhat high and two ultrasounds for size greater than dates suggested normal growth of the baby with normal amniotic fluid volume.  PAST MEDICAL HISTORY:  Reveals history of anemia and asthma.  Migraines and panic attack.  PAST SURGICAL HISTORY:  None.  MEDICATIONS:  Prenatal vitamins.  ALLERGIES:  KEFLEX caused hives.  FAMILY HISTORY:  Mother and father with diabetes and hypertension. Mother also has thyroid dysfunction.  OBSTETRIC HISTORY:  The patient had a spontaneous abortion in 2010.  SOCIAL HISTORY:  The patient is active without formal exercise program. Denies alcohol, tobacco, and illicit substance abuse.  She works for [employer] of [place].  She has had 3 years of college.  PHYSICAL EXAMINATION:   GENERAL:  She is 5 feet 5 inches tall, weight is 235 pounds. HEAD:  Normal. EYES:  Normal. NOSE:  Normal. THROAT:  Normal. LUNGS:  Clear to auscultation. HEART:  Normal size and sounds.  No murmurs. ABDOMEN:  The fundal height is 40.5 cm.  Fetal heart tones are normal. The cervix is loose fingertip dilated, 60% effaced, and vertex at a -2 station.  ADMITTING IMPRESSION:  Intrauterine pregnancy at 40 plus weeks.  The patient is admitted for induction of labor, and she understands the potential for complications and is ready to proceed.     [name], [name]     TFH/MEDQ  D:  08/10/2012  T:  08/10/2012  Job:  [id number]

## 2012-08-11 NOTE — Progress Notes (Signed)
Patient ID: Cynthia Little, female   DOB: 08/19/1991, 21 y.o.   MRN: [id number] FHR is markedly improved She is still contracting off pitocin. The cervix is 6 cm 90 % effaced and the vertex is at -1/-2 station. BP 78/52 and 93/54 Will give more ephedrine

## 2012-08-11 NOTE — Progress Notes (Signed)
Patient ID: Cynthia Little, female   DOB: 14-Jul-1991, 21 y.o.   MRN: [id number] Pitocin has not been started Cervix 2 cm 60% effaced and the vertex is at -3 station.

## 2012-08-11 NOTE — Progress Notes (Signed)
MD orders to give ephedrine for low BP

## 2012-08-11 NOTE — Transfer of Care (Signed)
Immediate Anesthesia Transfer of Care Note  Patient: Cynthia Little  Procedure(s) Performed: Procedure(s) (LRB): CESAREAN SECTION (N/A)  Patient Location: PACU  Anesthesia Type: Epidural  Level of Consciousness: awake, alert  and oriented  Airway & Oxygen Therapy: Patient Spontanous Breathing  Post-op Assessment: Report given to PACU RN  Post vital signs: Reviewed and stable  Complications: No apparent anesthesia complications

## 2012-08-11 NOTE — Anesthesia Procedure Notes (Signed)
Epidural Patient location during procedure: OB  Preanesthetic Checklist Completed: patient identified, site marked, surgical consent, pre-op evaluation, timeout performed, IV checked, risks and benefits discussed and monitors and equipment checked  Epidural Patient position: sitting Prep: site prepped and draped and DuraPrep Patient monitoring: continuous pulse ox and blood pressure Approach: midline Injection technique: LOR air  Needle:  Needle type: Tuohy  Needle gauge: 17 G Needle length: 9 cm Needle insertion depth: 7 cm Catheter type: closed end flexible Catheter size: 19 Gauge Catheter at skin depth: 14 cm Test dose: negative  Assessment Events: blood not aspirated, injection not painful, no injection resistance, negative IV test and no paresthesia  Additional Notes Dosing of Epidural:  1st dose, through needle ............................................Marland Kitchen epi 1:200K + Xylocaine 30 mg  2nd dose, through catheter, after waiting 3 minutes...Marland KitchenMarland Kitchenepi 1:200K + Xylocaine 30 mg  3rd dose, through catheter after waiting 3 minutes .............................Marcaine   4mg    ( mg Marcaine are expressed as equivilent  cc's medication removed from the 0.1%Bupiv / fentanyl syringe from L&D pump)  ( 2% Xylo charted as a single dose in Epic Meds for ease of charting; actual dosing was fractionated as above, for saftey's sake)  As each dose occurred, patient was free of IV sx; and patient exhibited no evidence of SA injection.  Patient is more comfortable after epidural dosed. Please see RN's note for documentation of vital signs,and FHR which are stable.

## 2012-08-11 NOTE — Op Note (Signed)
Operative note on Cynthia Little:  Date of the operation: 08/11/2012  Preoperative diagnosis: Intrauterine pregnancy at term, persistent fetal heart rate bradycardia, no progress beyond 6 cm  Postoperative diagnosis: Same  Operation: Low transverse cervical C-section  Operator: Bain Whichard  Anesthesia: Epidural  The patient was brought emergently to the operating room. She was placed on the operating room table and a fetal scalp electrode monitor showed the heart rate to be between 95 and 102. The anesthesiologist said go ahead and prep and the abdomen was prepped quickly with Betadine solution and time was not taken to place a Foley catheter. The abdomen was draped as a sterile field and the antibiotic gentamicin and clindamycin was started as soon after surgery began as possible. Anesthesia was confirmed and a low transverse incision was made and carried in layers through the skin subcutaneous tissue and fascia. The peritoneum was identified and entered. The peritoneal site incision was enlarged and the lower uterine segment was exposed.A transverse incision was made through the superficial layers of the myometrium in that eye and in the amniotic sac with my finger and stretch the incision by pulling superiorly and inferiorly. I elevated the vertex into the operative field but could not deliver it easily so I cut the rectus muscle on both sides and then the vertex delivered. I suction the baby's nose and mouth delivered the rest of the baby clamped and cut the cord and gave the infant to Dr. Jeanene Erb who was in attendance He assigned the Apgars of 5 and 9 at one and 5 minutes. The placenta was removed but the uterus was boggy so I massaged the uterus manually for a couple of minutes I removed all the products of conception and closed the uterus in 2 layers using a running locked suture of 0 Vicryl the first layer and nonlocking suture the same material and the second layer both tubes and ovaries  appeared normal there were some very small fibroids on the uterine surface I had exteriorized the uterus for better visualization and was able to reposition the uterus in the abdominal cavity only with the help of 2 Deaver retractors on the abdominal wall I removed all the blood clots from the gutters and began to close the abdominal wall but there seemed to be some bleeding so I reopened the abdominal wall diligently searched for all bleeding and after several more figure-of-eight sutures of 0 Vicryl on the uterine incision I felt hemostasis was complete I again began the closure of the abdominal wall closing the rectus muscle and peritoneum in one layer and reapproximating the cut rectus muscles with figure-of-eight sutures of 0 Vicryl. The fascia was then closed with 2 running sutures of 0 Vicryl, the subcutaneous tissue with a running 3-0 Vicryl, liver irrigating each layer and then the skin was closed with staples it should be mentioned that while the abdominal wall was still open I was having some difficulty with visualization so I removed my down and cause after catheterizing the patient I then regowned and regloved. Blood loss for the procedure was difficult to estimate the nurse anesthetist thought it was about 800 cc I would think more in terms of 1500 cc but  it was difficult to estimate. Sponge and needle counts were correct and she was returned to recovery in satisfactory condition

## 2012-08-11 NOTE — Progress Notes (Signed)
Patient ID: Cynthia Little, female   DOB: 08/14/1991, 21 y.o.   MRN: [id number] The pt received her epidural and her decelerations became slightly deeper and maybe slightly more prolonged. The pitocin was turned off, the pt was given ephedrine, she was positioned on her left side and O2 was given by mask. At present the cervix is 5 cm 90% effaced and the vertex is at -1/-2 station. The pt has been repositioned on her right side and the decelerations have slightly improved. She is continuing to contract q 3 to 3 and 1/2 minutes.Will observe closely.

## 2012-08-11 NOTE — Progress Notes (Signed)
Patient ID: Cynthia Little, female   DOB: 06/15/1991, 21 y.o.   MRN: [id number] Pitocin at 14 mu/ minute and the contractions are q 2-3 minutes. There are decelerations with the contractions with good recovery and good variability.. The cervix is 3-4 cm 90% effaced and the vertex is at -1/-2 station. The pt states she does not want an epidural

## 2012-08-11 NOTE — Anesthesia Preprocedure Evaluation (Addendum)

## 2012-08-11 NOTE — Progress Notes (Signed)
Patient ID: Cynthia Little, female   DOB: 01/22/1991, 21 y.o.   MRN: [id number] At 9:32 PM the FHR had stated below 100 so a stat c section was requested

## 2012-08-12 ENCOUNTER — Encounter (HOSPITAL_COMMUNITY): Payer: Self-pay | Admitting: Obstetrics and Gynecology

## 2012-08-12 LAB — CBC
HCT: 28.2 % — ABNORMAL LOW (ref 36.0–46.0)
Hemoglobin: 9.6 g/dL — ABNORMAL LOW (ref 12.0–15.0)
MCH: 27.4 pg (ref 26.0–34.0)
MCHC: 34 g/dL (ref 30.0–36.0)
MCV: 80.6 fL (ref 78.0–100.0)
Platelets: 153 10*3/uL (ref 150–400)
RBC: 3.5 MIL/uL — ABNORMAL LOW (ref 3.87–5.11)
RDW: 16.3 % — ABNORMAL HIGH (ref 11.5–15.5)
WBC: 12.4 10*3/uL — ABNORMAL HIGH (ref 4.0–10.5)

## 2012-08-12 MED ORDER — MENTHOL 3 MG MT LOZG: 1.0000 | LOZENGE | OROMUCOSAL | Status: DC | PRN

## 2012-08-12 MED ORDER — DIPHENHYDRAMINE HCL 25 MG PO CAPS
25.0000 mg | ORAL_CAPSULE | ORAL | Status: DC | PRN
  Administered 2012-08-12 – 2012-08-13 (×2): 25 mg via ORAL

## 2012-08-12 MED ORDER — LANOLIN HYDROUS EX OINT: 1.0000 "application " | TOPICAL_OINTMENT | CUTANEOUS | Status: DC | PRN

## 2012-08-12 MED ORDER — SENNOSIDES-DOCUSATE SODIUM 8.6-50 MG PO TABS
2.0000 | ORAL_TABLET | Freq: Every day | ORAL | Status: DC
  Administered 2012-08-13: 2 via ORAL

## 2012-08-12 MED ORDER — NALBUPHINE HCL 10 MG/ML IJ SOLN
5.0000 mg | INTRAMUSCULAR | Status: DC | PRN
  Administered 2012-08-12: 10 mg via INTRAVENOUS

## 2012-08-12 MED ORDER — MEASLES, MUMPS & RUBELLA VAC ~~LOC~~ INJ: 0.5000 mL | INJECTION | Freq: Once | SUBCUTANEOUS | Status: DC

## 2012-08-12 MED ORDER — TETANUS-DIPHTH-ACELL PERTUSSIS 5-2.5-18.5 LF-MCG/0.5 IM SUSP: 0.5000 mL | Freq: Once | INTRAMUSCULAR | Status: DC

## 2012-08-12 MED ORDER — SODIUM CHLORIDE 0.9 % IJ SOLN: 3.0000 mL | INTRAMUSCULAR | Status: DC | PRN

## 2012-08-12 MED ORDER — LANOLIN HYDROUS EX OINT: TOPICAL_OINTMENT | CUTANEOUS | Status: DC | PRN

## 2012-08-12 MED ORDER — ZOLPIDEM TARTRATE 5 MG PO TABS: 5.0000 mg | ORAL_TABLET | Freq: Every evening | ORAL | Status: DC | PRN

## 2012-08-12 MED ORDER — SIMETHICONE 80 MG PO CHEW
80.0000 mg | CHEWABLE_TABLET | Freq: Three times a day (TID) | ORAL | Status: DC
  Administered 2012-08-12 – 2012-08-14 (×8): 80 mg via ORAL

## 2012-08-12 MED ORDER — ONDANSETRON HCL 4 MG/2ML IJ SOLN: 4.0000 mg | Freq: Three times a day (TID) | INTRAMUSCULAR | Status: DC | PRN

## 2012-08-12 MED ORDER — NALBUPHINE SYRINGE 5 MG/0.5 ML
INJECTION | INTRAMUSCULAR | Status: AC
  Administered 2012-08-12: 10 mg via INTRAVENOUS
  Filled 2012-08-12: qty 1

## 2012-08-12 MED ORDER — ONDANSETRON HCL 4 MG PO TABS: 4.0000 mg | ORAL_TABLET | ORAL | Status: DC | PRN

## 2012-08-12 MED ORDER — DIPHENHYDRAMINE HCL 25 MG PO CAPS
25.0000 mg | ORAL_CAPSULE | Freq: Four times a day (QID) | ORAL | Status: DC | PRN
  Filled 2012-08-12 (×2): qty 1

## 2012-08-12 MED ORDER — KETOROLAC TROMETHAMINE 30 MG/ML IJ SOLN: 30.0000 mg | Freq: Four times a day (QID) | INTRAMUSCULAR | Status: AC | PRN

## 2012-08-12 MED ORDER — IBUPROFEN 600 MG PO TABS: 600.0000 mg | ORAL_TABLET | Freq: Four times a day (QID) | ORAL | Status: DC

## 2012-08-12 MED ORDER — SIMETHICONE 80 MG PO CHEW: 80.0000 mg | CHEWABLE_TABLET | ORAL | Status: DC | PRN

## 2012-08-12 MED ORDER — ONDANSETRON HCL 4 MG/2ML IJ SOLN: 4.0000 mg | INTRAMUSCULAR | Status: DC | PRN

## 2012-08-12 MED ORDER — OXYTOCIN 40 UNITS IN LACTATED RINGERS INFUSION - SIMPLE MED: 62.5000 mL/h | INTRAVENOUS | Status: AC

## 2012-08-12 MED ORDER — BENZOCAINE-MENTHOL 20-0.5 % EX AERO: 1.0000 "application " | INHALATION_SPRAY | CUTANEOUS | Status: DC | PRN

## 2012-08-12 MED ORDER — NALBUPHINE SYRINGE 5 MG/0.5 ML
5.0000 mg | INJECTION | INTRAMUSCULAR | Status: DC | PRN
  Filled 2012-08-12: qty 1

## 2012-08-12 MED ORDER — WITCH HAZEL-GLYCERIN EX PADS: 1.0000 "application " | MEDICATED_PAD | CUTANEOUS | Status: DC | PRN

## 2012-08-12 MED ORDER — NALBUPHINE SYRINGE 5 MG/0.5 ML
5.0000 mg | INJECTION | INTRAMUSCULAR | Status: DC | PRN
Start: 2012-08-12 — End: 2012-08-14
  Filled 2012-08-12: qty 1

## 2012-08-12 MED ORDER — GENTAMICIN SULFATE 40 MG/ML IJ SOLN
Freq: Three times a day (TID) | INTRAVENOUS | Status: AC
  Administered 2012-08-12 (×3): via INTRAVENOUS
  Filled 2012-08-12 (×3): qty 4

## 2012-08-12 MED ORDER — DIPHENHYDRAMINE HCL 50 MG/ML IJ SOLN: 25.0000 mg | INTRAMUSCULAR | Status: DC | PRN

## 2012-08-12 MED ORDER — DIBUCAINE 1 % RE OINT: 1.0000 "application " | TOPICAL_OINTMENT | RECTAL | Status: DC | PRN

## 2012-08-12 MED ORDER — PRENATAL MULTIVITAMIN CH
1.0000 | ORAL_TABLET | Freq: Every day | ORAL | Status: DC
  Administered 2012-08-12 – 2012-08-14 (×3): 1 via ORAL
  Filled 2012-08-12 (×3): qty 1

## 2012-08-12 MED ORDER — SENNOSIDES-DOCUSATE SODIUM 8.6-50 MG PO TABS: 2.0000 | ORAL_TABLET | Freq: Every day | ORAL | Status: DC

## 2012-08-12 MED ORDER — IBUPROFEN 600 MG PO TABS
600.0000 mg | ORAL_TABLET | Freq: Four times a day (QID) | ORAL | Status: DC
  Administered 2012-08-12 – 2012-08-14 (×9): 600 mg via ORAL
  Filled 2012-08-12 (×9): qty 1

## 2012-08-12 MED ORDER — MEASLES, MUMPS & RUBELLA VAC ~~LOC~~ INJ
0.5000 mL | INJECTION | Freq: Once | SUBCUTANEOUS | Status: DC
  Filled 2012-08-12: qty 0.5

## 2012-08-12 MED ORDER — DIPHENHYDRAMINE HCL 50 MG/ML IJ SOLN
12.5000 mg | INTRAMUSCULAR | Status: DC | PRN
  Administered 2012-08-12: 12.5 mg via INTRAVENOUS
  Filled 2012-08-12: qty 1

## 2012-08-12 MED ORDER — METOCLOPRAMIDE HCL 5 MG/ML IJ SOLN: 10.0000 mg | Freq: Three times a day (TID) | INTRAMUSCULAR | Status: DC | PRN

## 2012-08-12 MED ORDER — SODIUM CHLORIDE 0.9 % IV SOLN
1.0000 ug/kg/h | INTRAVENOUS | Status: DC | PRN
  Filled 2012-08-12: qty 2.5

## 2012-08-12 MED ORDER — PNEUMOCOCCAL VAC POLYVALENT 25 MCG/0.5ML IJ INJ
0.5000 mL | INJECTION | Freq: Once | INTRAMUSCULAR | Status: AC
  Administered 2012-08-12: 0.5 mL via INTRAMUSCULAR
  Filled 2012-08-12: qty 0.5

## 2012-08-12 MED ORDER — DIPHENHYDRAMINE HCL 25 MG PO CAPS: 25.0000 mg | ORAL_CAPSULE | Freq: Four times a day (QID) | ORAL | Status: DC | PRN

## 2012-08-12 MED ORDER — PRENATAL MULTIVITAMIN CH: 1.0000 | ORAL_TABLET | Freq: Every day | ORAL | Status: DC

## 2012-08-12 MED ORDER — NALOXONE HCL 0.4 MG/ML IJ SOLN: 0.4000 mg | INTRAMUSCULAR | Status: DC | PRN

## 2012-08-12 MED ORDER — OXYCODONE-ACETAMINOPHEN 5-325 MG PO TABS
1.0000 | ORAL_TABLET | ORAL | Status: DC | PRN
  Administered 2012-08-12 – 2012-08-14 (×5): 1 via ORAL
  Filled 2012-08-12 (×5): qty 1

## 2012-08-12 MED ORDER — LACTATED RINGERS IV SOLN
INTRAVENOUS | Status: DC
  Administered 2012-08-12: 06:00:00 via INTRAVENOUS

## 2012-08-12 MED ORDER — KETOROLAC TROMETHAMINE 30 MG/ML IJ SOLN
30.0000 mg | Freq: Four times a day (QID) | INTRAMUSCULAR | Status: AC | PRN
  Administered 2012-08-12: 30 mg via INTRAVENOUS
  Filled 2012-08-12: qty 1

## 2012-08-12 MED ORDER — OXYCODONE-ACETAMINOPHEN 5-325 MG PO TABS: 1.0000 | ORAL_TABLET | ORAL | Status: DC | PRN

## 2012-08-12 NOTE — Progress Notes (Signed)
Patient ID: Cynthia Little, female   DOB: June 10, 1991, 21 y.o.   MRN: [id number] #1 afebrile BP consistently slightly low. Pt feels fine. Output good Abdomen soft and not tender. HGB 12.5 to 9.6

## 2012-08-12 NOTE — Addendum Note (Signed)
Addendum  created 08/12/12 1610 by Jhonnie Garner, CRNA   Modules edited:Notes Section

## 2012-08-12 NOTE — Anesthesia Postprocedure Evaluation (Signed)
Anesthesia Post Note  Patient: Cynthia Little  Procedure(s) Performed: Procedure(s) (LRB): CESAREAN SECTION (N/A)  Anesthesia type: Epidural  Patient location: PACU  Post pain: Pain level controlled  Post assessment: Post-op Vital signs reviewed  Last Vitals:  Filed Vitals:   08/12/12 0015  BP: 101/61  Pulse:   Temp: 36.7 C  Resp: 16    Post vital signs: Reviewed  Level of consciousness: awake  Complications: No apparent anesthesia complications

## 2012-08-12 NOTE — Progress Notes (Addendum)
Patient ID: Cynthia Little, female   DOB: 08-17-1991, 21 y.o.   MRN: [id number] Pt progressed quickly to full dilatation and pushed well. Bradycardia developed in spite of lateral displacement of the uterus so a LML episiotomy was done under local block and the pt delivered with 1 contraction a living female infant 8 pounds 12 ounces with Apgars of 9 and 9 at 1 and 5 minutes. The presentation was LOA. The placenta delivered intact and there were separate membranes that were removed. The uterus was normal. There was a left vaginal laceration that was repaired with the LML episiotomy. Suture was 2-0 and 3-0 vicryl EBL 400 cc's   This note is in the wrong chart.

## 2012-08-12 NOTE — Anesthesia Postprocedure Evaluation (Signed)
Anesthesia Post Note  Patient: Cynthia Little  Procedure(s) Performed: Procedure(s) (LRB): CESAREAN SECTION (N/A)  Anesthesia type: Epidural  Patient location: Mother/Baby  Post pain: Pain level controlled  Post assessment: Post-op Vital signs reviewed  Last Vitals:  Filed Vitals:   08/12/12 0602  BP: 92/59  Pulse: 74  Temp:   Resp:     Post vital signs: Reviewed  Level of consciousness: awake  Complications: No apparent anesthesia complications

## 2012-08-12 NOTE — Progress Notes (Signed)
Patient was referred for history of depression/anxiety. * Referral screened out by Clinical Social Worker because none of the following criteria appear to apply:  ~ History of anxiety/depression during this pregnancy, or of post-partum depression.  ~ Diagnosis of anxiety and/or depression within last 3 years  ~ History of depression due to pregnancy loss/loss of child  OR * Patient's symptoms currently being treated with medication and/or therapy.  Please contact the Clinical Social Worker if needs arise, or by the patient's request. Pt's anxiety symptoms were situational and no longer an issue since she is no longer in [place], as per pt.  

## 2012-08-13 LAB — CBC
HCT: 24.2 % — ABNORMAL LOW (ref 36.0–46.0)
Hemoglobin: 8.3 g/dL — ABNORMAL LOW (ref 12.0–15.0)
MCH: 27.5 pg (ref 26.0–34.0)
MCHC: 34.3 g/dL (ref 30.0–36.0)
MCV: 80.1 fL (ref 78.0–100.0)
Platelets: 134 10*3/uL — ABNORMAL LOW (ref 150–400)
RBC: 3.02 MIL/uL — ABNORMAL LOW (ref 3.87–5.11)
RDW: 16.4 % — ABNORMAL HIGH (ref 11.5–15.5)
WBC: 9.8 10*3/uL (ref 4.0–10.5)

## 2012-08-13 MED ORDER — BISACODYL 10 MG RE SUPP
10.0000 mg | Freq: Once | RECTAL | Status: DC
  Filled 2012-08-13: qty 1

## 2012-08-13 NOTE — Progress Notes (Signed)
POD #2 LTCS No problems Afeb, VSS Abd- soft, fundus firm, incision intact Continue routine care 

## 2012-08-14 MED ORDER — IBUPROFEN 600 MG PO TABS: 600.0000 mg | ORAL_TABLET | Freq: Four times a day (QID) | ORAL | Status: AC

## 2012-08-14 MED ORDER — OXYCODONE-ACETAMINOPHEN 5-325 MG PO TABS: 1.0000 | ORAL_TABLET | ORAL | Status: AC | PRN

## 2012-08-14 NOTE — Progress Notes (Signed)
POD #3 LTCS No problems Afeb, VSS Abd- soft, fundus firm, incision intact D/c home 

## 2012-08-14 NOTE — Discharge Summary (Signed)
Obstetric Discharge Summary Reason for Admission: induction of labor Prenatal Procedures: NST Intrapartum Procedures: cesarean: low cervical, transverse, emergent, for fetal bradycardia Postpartum Procedures: none Complications-Operative and Postpartum: none Hemoglobin  Date Value Range Status  08/13/2012 8.3* 12.0 - 15.0 g/dL Final     HCT  Date Value Range Status  08/13/2012 24.2* 36.0 - 46.0 % Final    Physical Exam:  General: alert Lochia: appropriate Uterine Fundus: firm Incision: healing well  Discharge Diagnoses: Term Pregnancy-delivered and fetal bradycardia  Discharge Information: Date: 08/14/2012 Activity: pelvic rest and no strenuous activity Diet: routine Medications: Ibuprofen and Percocet Condition: stable Instructions: refer to practice specific booklet Discharge to: home Follow-up Information    Follow up with Bing Plume, MD. Schedule an appointment as soon as possible for a visit in 3 days. (for staple removal)    Contact information:   [employer] [employer]. [address] [place], [address] [place] [address] [phone]          Newborn Data: Live born female  Birth Weight: 8 lb 0.8 oz (3650 g) APGAR: 5, 9  Home with mother.  Stillman Buenger D 08/14/2012, 10:30 AM

## 2013-01-11 IMAGING — US US OB COMP LESS 14 WK
1 series · 14 of 28 positions shown · non-contrast
Comparison: None

CLINICAL DATA: Early pregnancy.  Abdominal pain.  Pelvic pain left
greater than right.  By LMP, the patient is 4 weeks 5 days.  By
LMP, EDC is 08/08/2012.  Quantitative beta HCG is 361.

OBSTETRIC <14 WK US AND TRANSVAGINAL OB US
TECHNIQUE: Both transabdominal and transvaginal ultrasound
examinations were performed for complete evaluation of the
gestation as well as the maternal uterus, adnexal regions, and
pelvic cul-de-sac.  Transvaginal technique was performed to assess
early pregnancy.

[Series 1: us ob comp less 14 wks · 14 of 36 slices shown]
[im 2/36]
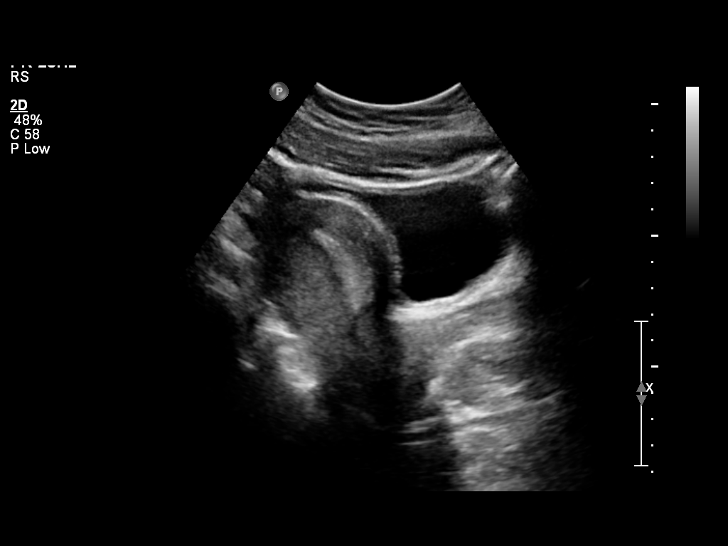
[im 4/36]
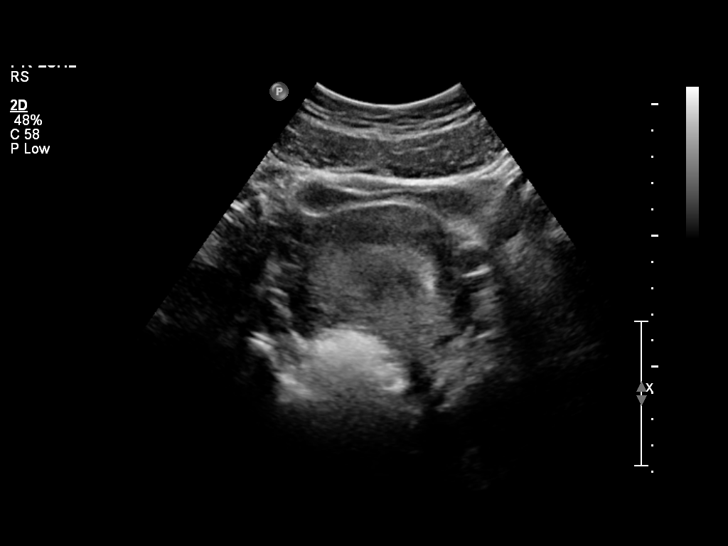
[im 7/36]
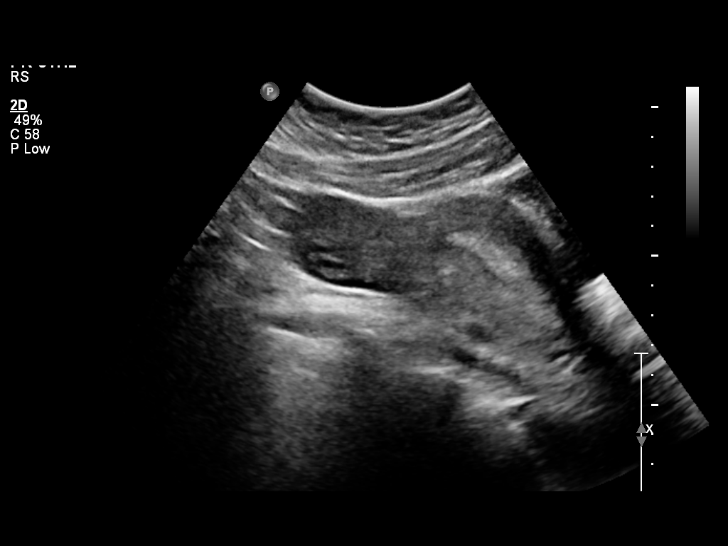
[im 10/36]
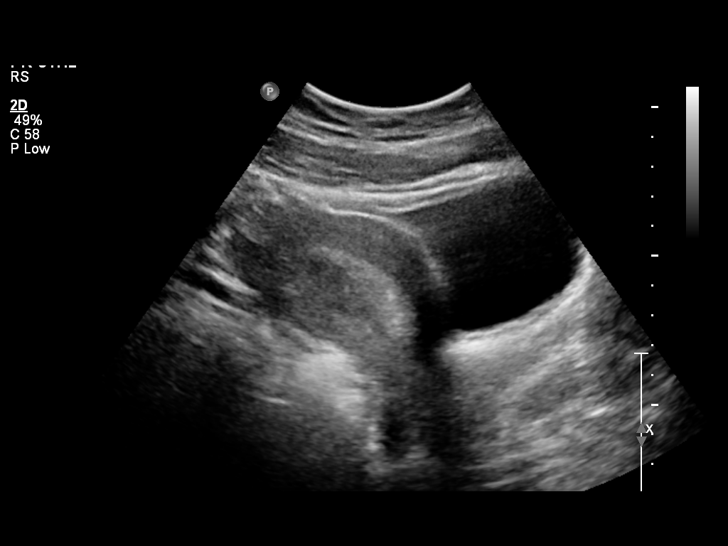
[im 12/36]
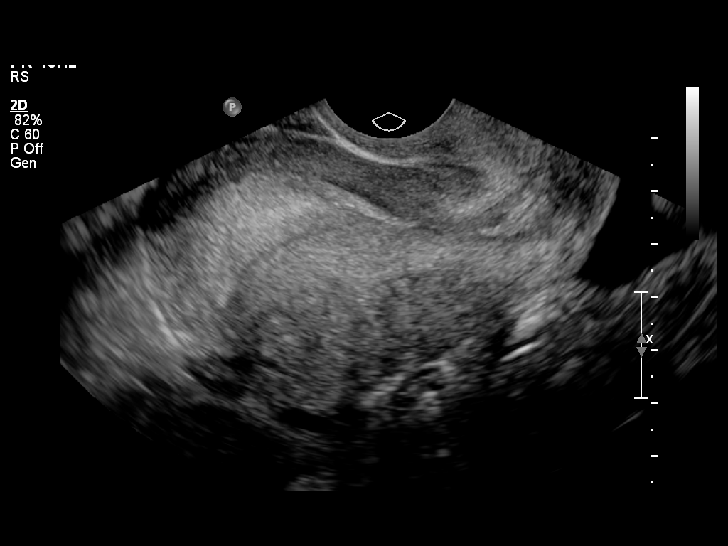
[im 15/36]
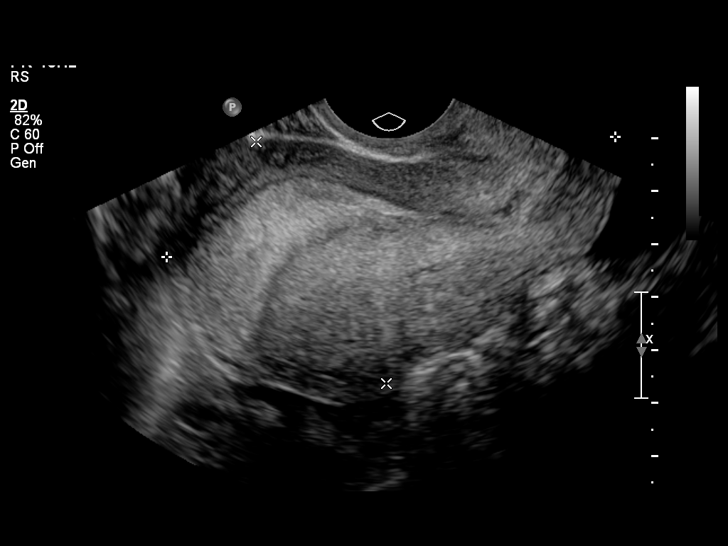
[im 17/36]
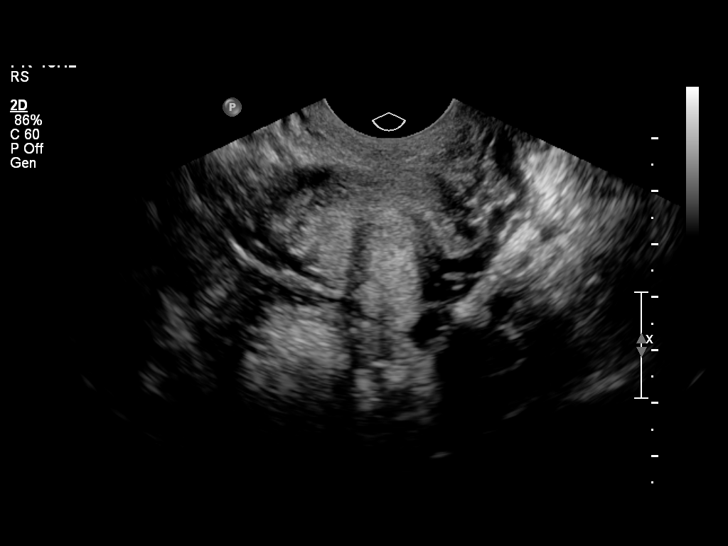
[im 20/36]
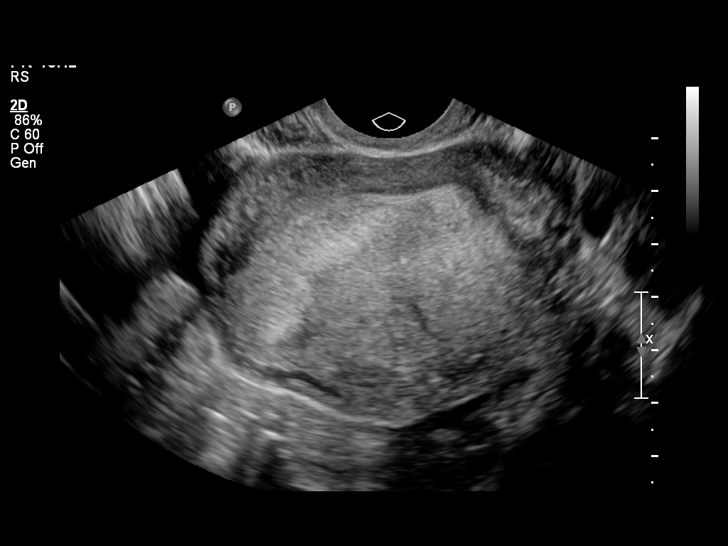
[im 23/36]
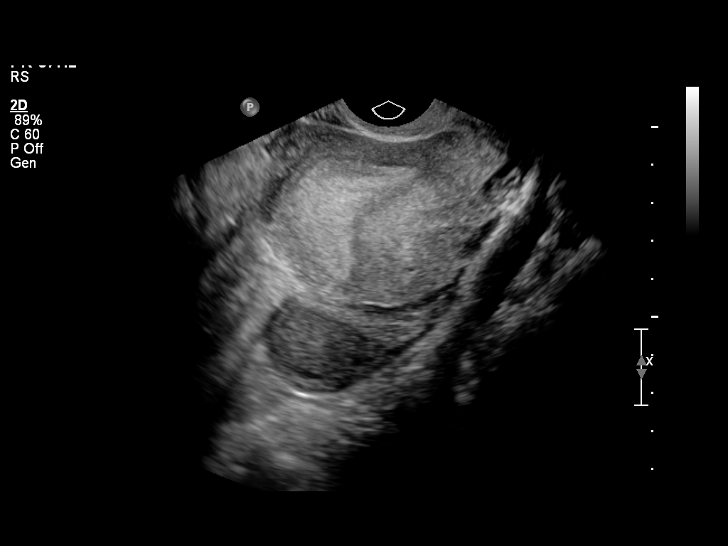
[im 25/36]
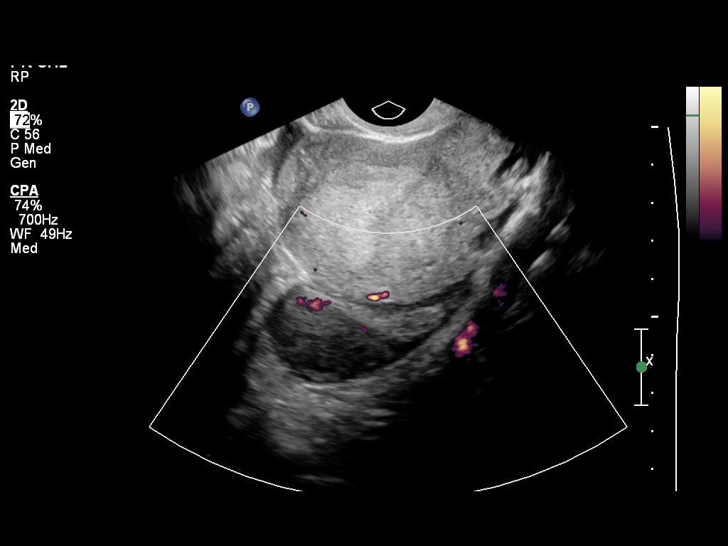
[im 28/36]
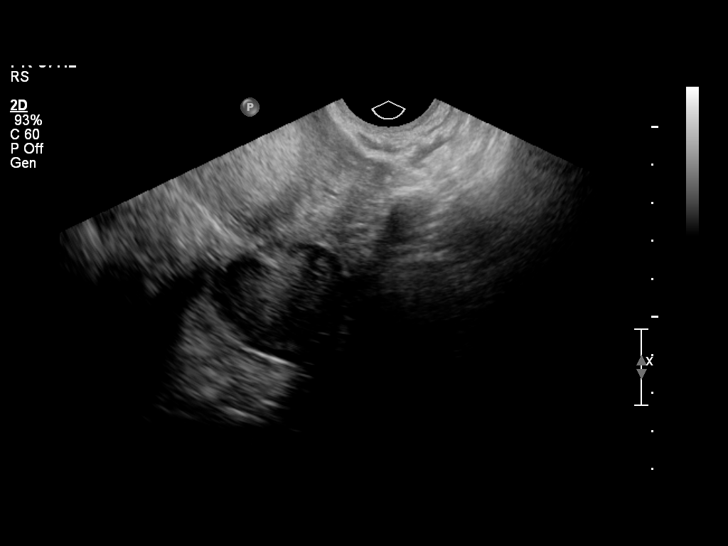
[im 30/36]
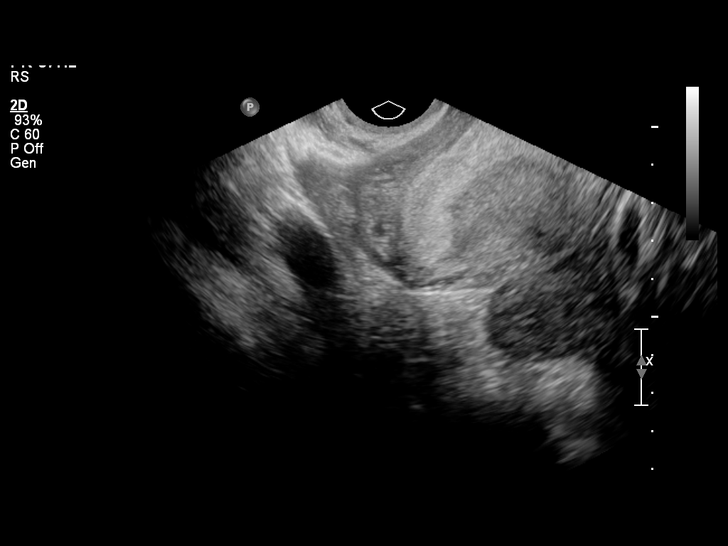
[im 33/36]
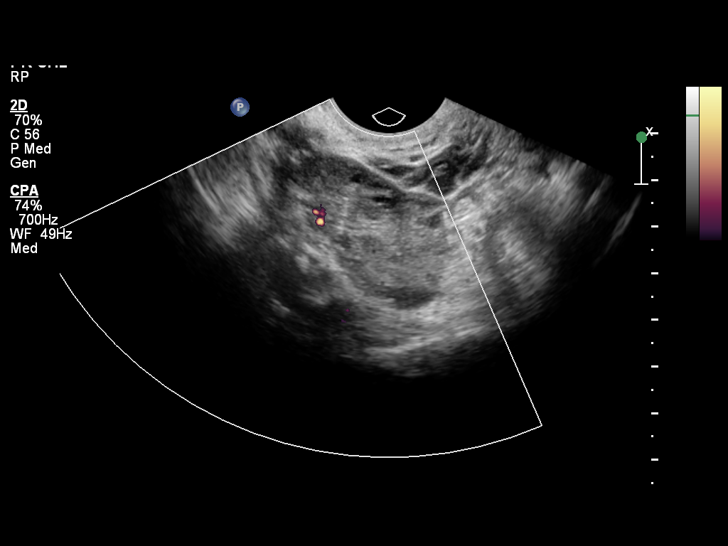
[im 36/36]
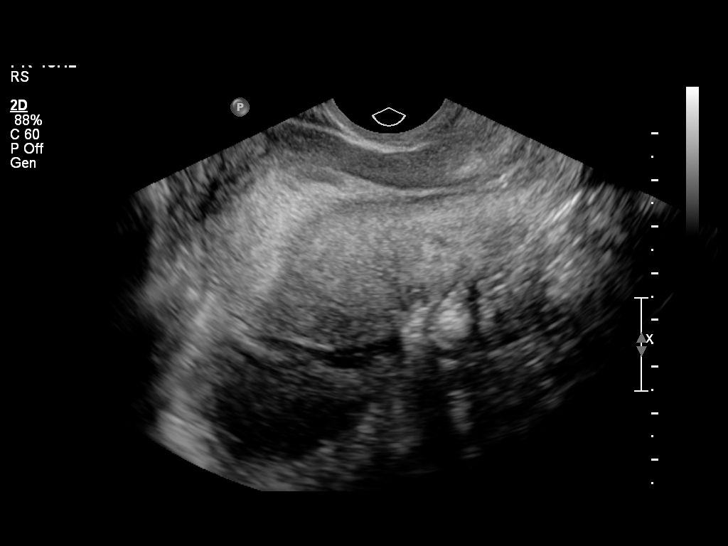

[14 of 28 positions shown; findings below may reference images not displayed]

Intrauterine gestational sac:  None
Yolk sac: None
Embryo: None
Cardiac Activity: None

Maternal uterus/adnexae:
The endometrial lining is thickened, 14 mm.  Ovaries have a normal
appearance.  Left ovary contains a corpus luteum cyst.  No free
pelvic fluid identified.
IMPRESSION: 1.  Thickened endometrial stripe, consistent with decidual
reaction.
2.  No evidence for intrauterine or ectopic pregnancy at this time.
Follow-up serial quantitative beta HCGs is recommended.  Follow-up
ultrasound is recommended to document presence and location of
pregnancy and for dating purposes.

## 2014-10-22 ENCOUNTER — Encounter (HOSPITAL_COMMUNITY): Payer: Self-pay | Admitting: Obstetrics and Gynecology

## 2017-10-27 ENCOUNTER — Encounter: Payer: Self-pay | Admitting: Physical Therapy

## 2017-10-27 ENCOUNTER — Ambulatory Visit: Payer: Non-veteran care | Attending: Family | Admitting: Physical Therapy

## 2017-10-27 DIAGNOSIS — R262 Difficulty in walking, not elsewhere classified: Secondary | ICD-10-CM

## 2017-10-27 DIAGNOSIS — M25552 Pain in left hip: Secondary | ICD-10-CM

## 2017-10-27 DIAGNOSIS — M25561 Pain in right knee: Secondary | ICD-10-CM | POA: Insufficient documentation

## 2017-10-27 DIAGNOSIS — G8929 Other chronic pain: Secondary | ICD-10-CM | POA: Diagnosis present

## 2017-10-27 NOTE — Therapy (Signed)
[place], [place], [address] Phone: [phone]   Fax:  [phone]  Physical Therapy Evaluation  Patient Details  Name: Cynthia Little MRN: [id number] Date of Birth: 23-Oct-1991 Referring Provider: Ursula Beath   Encounter Date: 10/27/2017  PT End of Session - 10/27/17 0842    Visit Number  1    Number of Visits  14    Date for PT Re-Evaluation  12/27/17    Authorization Type  [place],  a no show will stop treatment, no [place] or ionto > 6 visits    PT Start Time  0814    PT Stop Time  0850    PT Time Calculation (min)  36 min    Activity Tolerance  Patient tolerated treatment well    Behavior During Therapy  [facility] for tasks assessed/performed       Past Medical History:  Diagnosis Date  . Anemia   . Asthma   . Bronchitis     Past Surgical History:  Procedure Laterality Date  . NO PAST SURGERIES      There were no vitals filed for this visit.   Subjective Assessment - 10/27/17 0813    Subjective  Patient reports that over the past 6 months she has had increasing right knee and left hip pain, she is unsure of a cause for the recent pain.  She does reports that she has had some pain for a number of years.  Her x-rays show some knee joint effusion and degenerative changes    Limitations  Standing;Walking;House hold activities    How long can you walk comfortably?  15 minutes    Patient Stated Goals  have less pain    Currently in Pain?  Yes    Pain Score  6     Pain Location  Knee also pain in the left hip   also pain in the left hip   Pain Orientation  Right    Pain Descriptors / Indicators  Aching;Throbbing    Pain Type  Chronic pain    Pain Onset  More than a month ago    Pain Frequency  Constant    Aggravating Factors   walking, standing pain up to 10/10    Pain Relieving Factors  changes position, rest, pain meds, [place] pain can be a 3/10    Effect of Pain on Daily Activities   reports limits all standing and walking activities         [facility] PT Assessment - 10/27/17 0001      Assessment   Medical Diagnosis  right knee pain and left hip pain    Referring Provider  Mia Talcott    Onset Date/Surgical Date  09/26/17    Prior Therapy  PT last year, minimal benefits      Precautions   Precautions  None      Balance Screen   Has the patient fallen in the past 6 months  No    Has the patient had a decrease in activity level because of a fear of falling?   No    Is the patient reluctant to leave their home because of a fear of falling?   No      Home Environment   Additional Comments  no stairs, does housework, does yardwork, has a 28 year old      Prior Function   Level of Independence  Independent    Vocation  Full  time employment    Vocation Requirements  mostly sitting but can stand    Leisure  no exercise      ROM / Strength   AROM / PROM / Strength  AROM;PROM;Strength      AROM   Overall AROM Comments  left hip flexion 80 degrees, abduction 10 degrees    AROM Assessment Site  Knee    Right/Left Knee  Right    Right Knee Extension  0    Right Knee Flexion  80      PROM   Overall PROM Comments  PROM of the right knee to 88 degrees with c/o tightness and pain      Strength   Overall Strength Comments  right knee 4-/5 with some pain for extension, left hip 4-/5 with pain in the hips      Flexibility   Soft Tissue Assessment /Muscle Length  yes    Hamstrings  tight SLR 70 degrees    Quadriceps  very tight with pain in the anterior hips    ITB  very tight bilaterally    Piriformis  tight bilaterally      Palpation   Palpation comment  has lateral tracking patella on the right, has some swelling of the right knee area, tight and tender in the GT and ITB area      Ambulation/Gait   Gait Comments  some toe out gait, mild antalgic ont he right             Objective measurements completed on examination: See above findings.               PT Education - 10/27/17 0841    Education provided  Yes    Education Details  VMO activation, ITB, piriformis and HS stretches    Person(s) Educated  Patient    Methods  Explanation;Handout;Verbal cues;Tactile cues;Demonstration    Comprehension  Returned demonstration;Tactile cues required;Verbalized understanding       PT Short Term Goals - 10/27/17 0928      PT SHORT TERM GOAL #1   Title  independent with initia HEP    Time  2    Period  Weeks    Status  New        PT Long Term Goals - 10/27/17 4166      PT LONG TERM GOAL #1   Title  decrease pain 50%    Time  8    Period  Weeks    Status  New      PT LONG TERM GOAL #2   Title  increase right knee flexion to 110 degrees    Time  8    Period  Weeks    Status  New      PT LONG TERM GOAL #3   Title  report no difficulty with stairs and walking    Time  8    Period  Weeks    Status  New      PT LONG TERM GOAL #4   Title  independent with advanced HEP or gym program    Time  8    Period  Weeks    Status  New             Plan - 10/27/17 0920    Clinical Impression Statement  Patient with right knee and left hip pain, x-rays show some effusion and degenerative changes.  She has a lateral tracking patella, she has tight HS, piriformis and ITB's.  Her right knee ROM was decreased to 85 degrees flexion.  Mild limp with toe out gait.    Clinical Presentation  Stable    Clinical Decision Making  Low    Rehab Potential  Good    PT Frequency  2x / week    PT Duration  8 weeks    PT Treatment/Interventions  Cryotherapy;Electrical Stimulation;Ultrasound;Moist Heat;Iontophoresis 4mg /ml Dexamethasone;Gait training;Functional mobility training;Therapeutic activities;Therapeutic exercise;Balance training;Patient/family education;Neuromuscular re-education;Manual techniques;Vasopneumatic Device    PT Next Visit Plan  slowly add exercises, work on flexibility    Consulted and Agree with Plan of  Care  Patient       Patient will benefit from skilled therapeutic intervention in order to improve the following deficits and impairments:  Abnormal gait, Pain, Decreased mobility, Decreased range of motion, Decreased strength, Difficulty walking, Increased edema, Impaired flexibility  Visit Diagnosis: Chronic pain of right knee - Plan: PT plan of care cert/re-cert  Pain in left hip - Plan: PT plan of care cert/re-cert  Difficulty in walking, not elsewhere classified - Plan: PT plan of care cert/re-cert  G-Codes - 48/54/62 1050    Functional Assessment Tool Used (Outpatient Only)  foto 62% limitation    Functional Limitation  Mobility: Walking and moving around    Mobility: Walking and Moving Around Current Status (V0350)  At least 60 percent but less than 80 percent impaired, limited or restricted    Mobility: Walking and Moving Around Goal Status (K9381)  At least 40 percent but less than 60 percent impaired, limited or restricted        Problem List There are no active problems to display for this patient.   Sumner Boast., PT 10/27/2017, 10:56 AM  [place] [address] [place], [place], [address] Phone: [phone]   Fax:  [phone]  Name: Cynthia Little MRN: [id number] Date of Birth: 1991/07/15

## 2017-11-02 ENCOUNTER — Encounter: Payer: Self-pay | Admitting: Physical Therapy

## 2017-11-02 ENCOUNTER — Ambulatory Visit: Payer: Non-veteran care | Admitting: Physical Therapy

## 2017-11-02 DIAGNOSIS — M25561 Pain in right knee: Secondary | ICD-10-CM | POA: Diagnosis not present

## 2017-11-02 DIAGNOSIS — M25552 Pain in left hip: Secondary | ICD-10-CM

## 2017-11-02 DIAGNOSIS — R262 Difficulty in walking, not elsewhere classified: Secondary | ICD-10-CM

## 2017-11-02 DIAGNOSIS — G8929 Other chronic pain: Secondary | ICD-10-CM

## 2017-11-02 NOTE — Therapy (Signed)
[place], [place], [address] Phone: [phone]   Fax:  [phone]  Physical Therapy Treatment  Patient Details  Name: Cynthia Little MRN: [id number] Date of Birth: [birth year] Referring Provider: Ursula Beath   Encounter Date: 11/02/2017  PT End of Session - 11/02/17 0831    Visit Number  2    Number of Visits  14    Date for PT Re-Evaluation  12/27/17    Authorization Type  [facility],  a no show will stop treatment, no [place] or ionto > 6 visits    PT Start Time  0805    PT Stop Time  0836    PT Time Calculation (min)  31 min       Past Medical History:  Diagnosis Date  . Anemia   . Asthma   . Bronchitis     Past Surgical History:  Procedure Laterality Date  . NO PAST SURGERIES      There were no vitals filed for this visit.  Subjective Assessment - 11/02/17 0809    Subjective  Some better, stretches helping.    Currently in Pain?  Yes    Pain Score  4     Pain Location  Knee    Pain Orientation  Right                      [facility] Adult PT Treatment/Exercise - 11/02/17 0001      Exercises   Exercises  Knee/Hip      Knee/Hip Exercises: Aerobic   Stationary Bike  5 min      Knee/Hip Exercises: Standing   Hip Flexion  Stengthening;Both;10 reps;2 sets red tband ( 1 set toes fwd , 1 set ER)    Hip Abduction  Stengthening;Both;1 set;10 reps red tband    Hip Extension  Stengthening;Both;1 set;10 reps red tband    Lateral Step Up  Both;1 set;10 reps;Hand Hold: 2;Step Height: 6" opp leg abd    Forward Step Up  Both;1 set;10 reps;Hand Hold: 2;Step Height: 6" opp leg ext      Knee/Hip Exercises: Seated   Long Arc Quad  Strengthening;Left;2 sets;10 reps red tband with ball squeeze      Knee/Hip Exercises: Supine   Other Supine Knee/Hip Exercises  bridge with ball and obl 10 each      Manual Therapy   Manual Therapy  Passive ROM    Passive ROM  LEs                PT Short Term Goals - 10/27/17 0928      PT SHORT TERM GOAL #1   Title  independent with initia HEP    Time  2    Period  Weeks    Status  New        PT Long Term Goals - 10/27/17 4854      PT LONG TERM GOAL #1   Title  decrease pain 50%    Time  8    Period  Weeks    Status  New      PT LONG TERM GOAL #2   Title  increase right knee flexion to 110 degrees    Time  8    Period  Weeks    Status  New      PT LONG TERM GOAL #3   Title  report no difficulty with stairs and walking    Time  8    Period  Weeks    Status  New      PT LONG TERM GOAL #4   Title  independent with advanced HEP or gym program    Time  8    Period  Weeks    Status  New            Plan - 11/02/17 5366    Clinical Impression Statement  pt tolerated ther ex with fatigue and ache but no increased pain. tightness in left hip noted with PROM    PT Treatment/Interventions  Cryotherapy;Electrical Stimulation;Ultrasound;Moist Heat;Iontophoresis 4mg /ml Dexamethasone;Gait training;Functional mobility training;Therapeutic activities;Therapeutic exercise;Balance training;Patient/family education;Neuromuscular re-education;Manual techniques;Vasopneumatic Device    PT Next Visit Plan  assess today session and issue tband ex if okay       Patient will benefit from skilled therapeutic intervention in order to improve the following deficits and impairments:  Abnormal gait, Pain, Decreased mobility, Decreased range of motion, Decreased strength, Difficulty walking, Increased edema, Impaired flexibility  Visit Diagnosis: Chronic pain of right knee  Pain in left hip  Difficulty in walking, not elsewhere classified     Problem List There are no active problems to display for this patient.   Loraina Stauffer,ANGIE PTA 11/02/2017, 8:33 AM  [place], [place], [address] Phone: [phone]   Fax:   [phone]  Name: Beretta Ginsberg MRN: [id number] Date of Birth: 08/25/1991

## 2017-11-04 ENCOUNTER — Ambulatory Visit: Payer: Non-veteran care | Admitting: Physical Therapy

## 2017-11-09 ENCOUNTER — Encounter: Payer: Self-pay | Admitting: Physical Therapy

## 2017-11-09 ENCOUNTER — Ambulatory Visit: Payer: Non-veteran care | Admitting: Physical Therapy

## 2017-11-09 DIAGNOSIS — R262 Difficulty in walking, not elsewhere classified: Secondary | ICD-10-CM

## 2017-11-09 DIAGNOSIS — M25561 Pain in right knee: Principal | ICD-10-CM

## 2017-11-09 DIAGNOSIS — M25552 Pain in left hip: Secondary | ICD-10-CM

## 2017-11-09 DIAGNOSIS — G8929 Other chronic pain: Secondary | ICD-10-CM

## 2017-11-09 NOTE — Therapy (Signed)
[place], [place], [address] Phone: [phone]   Fax:  [phone]  Physical Therapy Treatment  Patient Details  Name: Cynthia Little MRN: [id number] Date of Birth: June 11, 1991 Referring Provider: Ursula Beath   Encounter Date: 11/09/2017  PT End of Session - 11/09/17 0819    Visit Number  3    Number of Visits  14    Date for PT Re-Evaluation  12/27/17    PT Start Time  0800    PT Stop Time  0845    PT Time Calculation (min)  45 min       Past Medical History:  Diagnosis Date  . Anemia   . Asthma   . Bronchitis     Past Surgical History:  Procedure Laterality Date  . CESAREAN SECTION N/A 08/11/2012   Performed by Melina Schools, MD at [facility]  . NO PAST SURGERIES      There were no vitals filed for this visit.  Subjective Assessment - 11/09/17 0756    Subjective  hips better, knee bothering me    Currently in Pain?  Yes    Pain Score  8     Pain Location  Knee    Pain Orientation  Right         [facility] PT Assessment - 11/09/17 0001      AROM   Right Knee Flexion  96                  [facility] Adult PT Treatment/Exercise - 11/09/17 0001      Knee/Hip Exercises: Aerobic   Stationary Bike  5 min      Knee/Hip Exercises: Machines for Strengthening   Cybex Knee Extension  5# 3 sets 10 with ball squeeze    Cybex Leg Press  20# 3 SETS 10 with ball squeeze      Modalities   Modalities  Cryotherapy;Electrical Stimulation      Cryotherapy   Number Minutes Cryotherapy  15 Minutes    Cryotherapy Location  Knee    Type of Cryotherapy  Ice pack      Electrical Stimulation   Electrical Stimulation Location  rt knee    Electrical Stimulation Parameters  IFC    Electrical Stimulation Goals  Pain             PT Education - 11/09/17 0808    Education provided  Yes    Education Details  red tband VMO LAQ, hip 3 way    Person(s) Educated  Patient    Methods   Explanation;Demonstration;Handout    Comprehension  Verbalized understanding;Returned demonstration       PT Short Term Goals - 11/09/17 0815      PT SHORT TERM GOAL #1   Title  independent with initia HEP    Status  Achieved        PT Long Term Goals - 11/09/17 0817      PT LONG TERM GOAL #1   Title  decrease pain 50%    Status  On-going      PT LONG TERM GOAL #2   Title  increase right knee flexion to 110 degrees    Status  On-going      PT LONG TERM GOAL #3   Title  report no difficulty with stairs and walking    Status  On-going      PT LONG TERM GOAL #4   Title  independent with advanced HEP or gym program    Status  On-going            Plan - 11/09/17 0819    Clinical Impression Statement  STG met issued strength HEP to go with stretches. No increase in pain with ex but fatigue noted esp in hips. focus on VMO activation with ex    PT Treatment/Interventions  Cryotherapy;Electrical Stimulation;Ultrasound;Moist Heat;Iontophoresis 78m/ml Dexamethasone;Gait training;Functional mobility training;Therapeutic activities;Therapeutic exercise;Balance training;Patient/family education;Neuromuscular re-education;Manual techniques;Vasopneumatic Device    PT Next Visit Plan  hip and VMO/quad strength. assess use on estim       Patient will benefit from skilled therapeutic intervention in order to improve the following deficits and impairments:  Abnormal gait, Pain, Decreased mobility, Decreased range of motion, Decreased strength, Difficulty walking, Increased edema, Impaired flexibility  Visit Diagnosis: Chronic pain of right knee  Pain in left hip  Difficulty in walking, not elsewhere classified     Problem List There are no active problems to display for this patient.   PAYSEUR,ANGIE PTA  11/09/2017, 8:21 AM  [address] [address]Phone: [phone]  Fax:   [phone] Name: JDaneille DesilvaMRN: [id number] of Birth: 110/19/1992

## 2017-11-16 ENCOUNTER — Encounter: Payer: Self-pay | Admitting: Physical Therapy

## 2017-11-16 ENCOUNTER — Ambulatory Visit: Payer: Non-veteran care | Admitting: Physical Therapy

## 2017-11-16 DIAGNOSIS — G8929 Other chronic pain: Secondary | ICD-10-CM

## 2017-11-16 DIAGNOSIS — M25561 Pain in right knee: Principal | ICD-10-CM

## 2017-11-16 DIAGNOSIS — R262 Difficulty in walking, not elsewhere classified: Secondary | ICD-10-CM

## 2017-11-16 NOTE — Therapy (Signed)
[place] [address] [place], [place], [address] Phone: [phone]   Fax:  [phone]  Physical Therapy Treatment  Patient Details  Name: Cynthia Little MRN: [id number] Date of Birth: Feb 21, 1991 Referring Provider: Ursula Beath   Encounter Date: 11/16/2017  PT End of Session - 11/16/17 0830    Visit Number  4    Number of Visits  14    PT Start Time  0800    PT Stop Time  0845    PT Time Calculation (min)  45 min       Past Medical History:  Diagnosis Date  . Anemia   . Asthma   . Bronchitis     Past Surgical History:  Procedure Laterality Date  . CESAREAN SECTION  08/11/2012   Procedure: CESAREAN SECTION;  Surgeon: Melina Schools, MD;  Location: [facility];  Service: Gynecology;  Laterality: N/A;  . NO PAST SURGERIES      There were no vitals filed for this visit.  Subjective Assessment - 11/16/17 0800    Subjective  hip is doing well, knee still bothering me and very tight    Currently in Pain?  Yes    Pain Score  7     Pain Orientation  Right                      [facility] Adult PT Treatment/Exercise - 11/16/17 0001      Knee/Hip Exercises: Aerobic   Nustep  L 4 6 min LE only      Knee/Hip Exercises: Machines for Strengthening   Cybex Knee Extension  10# 3 sets 10 with ball squeeze    Cybex Leg Press  20# 3 SETS 10 with ball squeeze      Knee/Hip Exercises: Standing   Lateral Step Up  Right;2 sets;15 reps;Hand Hold: 2;Step Height: 6" add step up for VMO    Wall Squat  2 sets;5 reps;5 seconds with ball squeeze    Other Standing Knee Exercises  curtsy lunge 20 alt      Modalities   Modalities  Iontophoresis      Iontophoresis   Type of Iontophoresis  Dexamethasone    Location  medial knee    Dose  1.2 cc    Time  4 hour leave on patch      Manual Therapy   Manual Therapy  Joint mobilization;Soft tissue mobilization    Joint Mobilization  belt mobs for joint capsule  stretch    Soft tissue mobilization  medial knee tightness             PT Education - 11/16/17 0829    Education provided  Yes    Education Details  VMO activation and lateral shift    Methods  Explanation;Demonstration    Comprehension  Verbalized understanding       PT Short Term Goals - 11/09/17 0815      PT SHORT TERM GOAL #1   Title  independent with initia HEP    Status  Achieved        PT Long Term Goals - 11/09/17 0817      PT LONG TERM GOAL #1   Title  decrease pain 50%    Status  On-going      PT LONG TERM GOAL #2   Title  increase right knee flexion to 110 degrees    Status  On-going      PT LONG  TERM GOAL #3   Title  report no difficulty with stairs and walking    Status  On-going      PT LONG TERM GOAL #4   Title  independent with advanced HEP or gym program    Status  On-going            Plan - 11/16/17 0830    Clinical Impression Statement  pt with decreased VMO firing and lateral shift present, explained this to pt and verb importance of HEP. Trial of ionto    PT Treatment/Interventions  Cryotherapy;Electrical Stimulation;Ultrasound;Moist Heat;Iontophoresis 4mg /ml Dexamethasone;Gait training;Functional mobility training;Therapeutic activities;Therapeutic exercise;Balance training;Patient/family education;Neuromuscular re-education;Manual techniques;Vasopneumatic Device    PT Next Visit Plan  VMO strengthening       Patient will benefit from skilled therapeutic intervention in order to improve the following deficits and impairments:  Abnormal gait, Pain, Decreased mobility, Decreased range of motion, Decreased strength, Difficulty walking, Increased edema, Impaired flexibility  Visit Diagnosis: Chronic pain of right knee  Difficulty in walking, not elsewhere classified     Problem List There are no active problems to display for this patient.   Desirae Mancusi,ANGIE PTA 11/16/2017, 8:32 AM  [address] [place], [place], [address] Phone: [phone]   Fax:  [phone]  Name: Piya Mesch MRN: [id number] Date of Birth: 09-Feb-1991

## 2017-11-18 ENCOUNTER — Ambulatory Visit: Payer: Non-veteran care | Admitting: Physical Therapy

## 2017-11-22 ENCOUNTER — Ambulatory Visit: Payer: Non-veteran care | Attending: Family | Admitting: Physical Therapy

## 2017-11-22 ENCOUNTER — Encounter: Payer: Self-pay | Admitting: Physical Therapy

## 2017-11-22 DIAGNOSIS — M25561 Pain in right knee: Secondary | ICD-10-CM | POA: Diagnosis not present

## 2017-11-22 DIAGNOSIS — G8929 Other chronic pain: Secondary | ICD-10-CM | POA: Diagnosis present

## 2017-11-22 DIAGNOSIS — R262 Difficulty in walking, not elsewhere classified: Secondary | ICD-10-CM | POA: Diagnosis present

## 2017-11-22 DIAGNOSIS — M25552 Pain in left hip: Secondary | ICD-10-CM | POA: Insufficient documentation

## 2017-11-22 NOTE — Therapy (Signed)
[place], [place], [address] Phone: [phone]   Fax:  [phone]  Physical Therapy Treatment  Patient Details  Name: Cynthia Little MRN: [id number] Date of Birth: 1991/11/22 Referring Provider: Ursula Beath   Encounter Date: 11/22/2017  PT End of Session - 11/22/17 0845    Visit Number  5    Number of Visits  14    Date for PT Re-Evaluation  12/27/17    PT Start Time  0803    PT Stop Time  0852    PT Time Calculation (min)  49 min       Past Medical History:  Diagnosis Date  . Anemia   . Asthma   . Bronchitis     Past Surgical History:  Procedure Laterality Date  . CESAREAN SECTION  08/11/2012   Procedure: CESAREAN SECTION;  Surgeon: Melina Schools, MD;  Location: [facility];  Service: Gynecology;  Laterality: N/A;  . NO PAST SURGERIES      There were no vitals filed for this visit.  Subjective Assessment - 11/22/17 0807    Subjective  "Everything is going good, my knee was bothering me Friday and Saturday and it is better"    Currently in Pain?  Yes    Pain Score  3     Pain Location  Knee    Pain Orientation  Right    Pain Descriptors / Indicators  Tightness         [facility] PT Assessment - 11/22/17 0001      AROM   AROM Assessment Site  Knee    Right/Left Knee  Right    Right Knee Extension  0    Right Knee Flexion  111                  [facility] Adult PT Treatment/Exercise - 11/22/17 0001      High Level Balance   High Level Balance Comments  RLE SLS 3 way with rebound ball toss x10      Knee/Hip Exercises: Aerobic   Nustep  L 5 5 min       Knee/Hip Exercises: Machines for Strengthening   Cybex Knee Extension  10# 3 sets 10    Cybex Knee Flexion  25lb 2x15     Cybex Leg Press  30# 3 SETS 10 with ball squeeze      Knee/Hip Exercises: Standing   Lateral Step Up  Right;2 sets;Step Height: 6";10 reps;Hand Hold: 0    Forward Step Up  Right;2 sets;10  reps;Hand Hold: 0;Step Height: 6"      Knee/Hip Exercises: Seated   Long Arc Quad  Right;2 sets;10 reps with ball squeeze       Modalities   Modalities  Cryotherapy      Cryotherapy   Number Minutes Cryotherapy  10 Minutes    Cryotherapy Location  Knee    Type of Cryotherapy  Ice pack               PT Short Term Goals - 11/09/17 0815      PT SHORT TERM GOAL #1   Title  independent with initia HEP    Status  Achieved        PT Long Term Goals - 11/22/17 0827      PT LONG TERM GOAL #2   Title  increase right knee flexion to 110 degrees    Status  Achieved  Plan - 11/22/17 0847    Clinical Impression Statement  Pt tolerated today's treatment well evident by no subjective reports of increase pain.  Pt reports that he knee feels better. Pt stated that the ionto patch would not stay home. ice pack to decrease pain and control swelling.    Rehab Potential  Good    PT Frequency  2x / week    PT Duration  8 weeks    PT Treatment/Interventions  Cryotherapy;Electrical Stimulation;Ultrasound;Moist Heat;Iontophoresis 4mg /ml Dexamethasone;Gait training;Functional mobility training;Therapeutic activities;Therapeutic exercise;Balance training;Patient/family education;Neuromuscular re-education;Manual techniques;Vasopneumatic Device    PT Next Visit Plan  VMO strengthening       Patient will benefit from skilled therapeutic intervention in order to improve the following deficits and impairments:  Abnormal gait, Pain, Decreased mobility, Decreased range of motion, Decreased strength, Difficulty walking, Increased edema, Impaired flexibility  Visit Diagnosis: Chronic pain of right knee  Difficulty in walking, not elsewhere classified  Pain in left hip     Problem List There are no active problems to display for this patient.   Scot Jun, PTA 11/22/2017, 8:50 AM  [place] [address] [place], [place], [address] Phone: [phone]   Fax:  [phone]  Name: Shanda Cadotte MRN: [id number] Date of Birth: 1991-12-05

## 2017-11-24 ENCOUNTER — Ambulatory Visit: Payer: Non-veteran care | Admitting: Physical Therapy

## 2017-11-24 ENCOUNTER — Encounter: Payer: Self-pay | Admitting: Physical Therapy

## 2017-11-24 DIAGNOSIS — R262 Difficulty in walking, not elsewhere classified: Secondary | ICD-10-CM

## 2017-11-24 DIAGNOSIS — M25561 Pain in right knee: Secondary | ICD-10-CM

## 2017-11-24 DIAGNOSIS — M25552 Pain in left hip: Secondary | ICD-10-CM

## 2017-11-24 DIAGNOSIS — G8929 Other chronic pain: Secondary | ICD-10-CM

## 2017-11-24 NOTE — Therapy (Signed)
[place], [place], [address] Phone: [phone]   Fax:  [phone]  Physical Therapy Treatment  Patient Details  Name: Cynthia Little MRN: [id number] Date of Birth: 12-06-1991 Referring Provider: Ursula Beath   Encounter Date: 11/24/2017  PT End of Session - 11/24/17 0845    Visit Number  6    Number of Visits  14    Date for PT Re-Evaluation  12/27/17    PT Start Time  0808    PT Stop Time  0859    PT Time Calculation (min)  51 min    Activity Tolerance  Patient tolerated treatment well    Behavior During Therapy  [facility] for tasks assessed/performed       Past Medical History:  Diagnosis Date  . Anemia   . Asthma   . Bronchitis     Past Surgical History:  Procedure Laterality Date  . CESAREAN SECTION  08/11/2012   Procedure: CESAREAN SECTION;  Surgeon: Melina Schools, MD;  Location: [facility];  Service: Gynecology;  Laterality: N/A;  . NO PAST SURGERIES      There were no vitals filed for this visit.  Subjective Assessment - 11/24/17 0811    Subjective  "Im doing good" No issues after last treatment.    Currently in Pain?  No/denies    Pain Score  0-No pain                      [facility] Adult PT Treatment/Exercise - 11/24/17 0001      Ambulation/Gait   Stairs  Yes    Stairs Assistance  7: Independent    Stair Management Technique  No rails;Alternating pattern    Number of Stairs  24    Height of Stairs  6    Gait Comments  2 flights      Knee/Hip Exercises: Aerobic   Stationary Bike  L x 6 min       Knee/Hip Exercises: Machines for Strengthening   Cybex Knee Extension  10# 2 sets 15    Cybex Knee Flexion  25lb 2x15     Cybex Leg Press  30# 2 SETS 15 with ball squeeze; RLE 20lb 2x5       Knee/Hip Exercises: Standing   Heel Raises  Both;2 sets;2 seconds;15 reps x2    Other Standing Knee Exercises  Controlled desents 4in RLE      Knee/Hip Exercises: Seated    Long Arc Quad  Right;10 reps;3 sets    Heel Slides  -- with bsll squeeze      Modalities   Modalities  Cryotherapy      Cryotherapy   Number Minutes Cryotherapy  10 Minutes    Cryotherapy Location  Knee    Type of Cryotherapy  Ice pack               PT Short Term Goals - 11/09/17 0815      PT SHORT TERM GOAL #1   Title  independent with initia HEP    Status  Achieved        PT Long Term Goals - 11/24/17 0812      PT LONG TERM GOAL #1   Title  decrease pain 50%    Status  Achieved            Plan - 11/24/17 0846    Clinical Impression Statement  progressing towards goals, reports a pulling sensations  below R patella with stair negotiation and controlled descents. She also reports some L knee pain on leg press with ball squeeze.    Rehab Potential  Good    PT Frequency  2x / week    PT Duration  8 weeks    PT Treatment/Interventions  Cryotherapy;Electrical Stimulation;Ultrasound;Moist Heat;Iontophoresis 4mg /ml Dexamethasone;Gait training;Functional mobility training;Therapeutic activities;Therapeutic exercise;Balance training;Patient/family education;Neuromuscular re-education;Manual techniques;Vasopneumatic Device    PT Next Visit Plan  VMO strengthening       Patient will benefit from skilled therapeutic intervention in order to improve the following deficits and impairments:  Abnormal gait, Pain, Decreased mobility, Decreased range of motion, Decreased strength, Difficulty walking, Increased edema, Impaired flexibility  Visit Diagnosis: Difficulty in walking, not elsewhere classified  Pain in left hip  Chronic pain of right knee     Problem List There are no active problems to display for this patient.   Scot Jun, PTA 11/24/2017, 8:48 AM  [address] [address] [place], [place], [address] Phone: [phone]   Fax:  [phone]  Name: Tmya Wigington MRN:  [id number] Date of Birth: June 29, 1991

## 2017-11-29 ENCOUNTER — Ambulatory Visit: Payer: Non-veteran care | Admitting: Physical Therapy

## 2017-12-01 ENCOUNTER — Ambulatory Visit: Payer: Non-veteran care | Admitting: Physical Therapy

## 2017-12-06 ENCOUNTER — Ambulatory Visit: Payer: Non-veteran care | Admitting: Physical Therapy

## 2017-12-06 ENCOUNTER — Encounter: Payer: Self-pay | Admitting: Physical Therapy

## 2017-12-06 DIAGNOSIS — G8929 Other chronic pain: Secondary | ICD-10-CM

## 2017-12-06 DIAGNOSIS — R262 Difficulty in walking, not elsewhere classified: Secondary | ICD-10-CM

## 2017-12-06 DIAGNOSIS — M25561 Pain in right knee: Secondary | ICD-10-CM | POA: Diagnosis not present

## 2017-12-06 NOTE — Therapy (Signed)
[place], [place], [address] Phone: [phone]   Fax:  [phone]  Physical Therapy Treatment  Patient Details  Name: Cynthia Little MRN: [id number] Date of Birth: [birth year] Referring Provider: Ursula Beath   Encounter Date: 12/06/2017  PT End of Session - 12/06/17 0848    Visit Number  7    Date for PT Re-Evaluation  12/27/17    Authorization Type  [facility],  a no show will stop treatment, no [place] or ionto > 6 visits    PT Start Time  0803    PT Stop Time  0857    PT Time Calculation (min)  54 min    Activity Tolerance  Patient tolerated treatment well    Behavior During Therapy  [facility] for tasks assessed/performed       Past Medical History:  Diagnosis Date  . Anemia   . Asthma   . Bronchitis     Past Surgical History:  Procedure Laterality Date  . CESAREAN SECTION  08/11/2012   Procedure: CESAREAN SECTION;  Surgeon: Melina Schools, MD;  Location: [facility];  Service: Gynecology;  Laterality: N/A;  . NO PAST SURGERIES      There were no vitals filed for this visit.  Subjective Assessment - 12/06/17 0806    Subjective  Pt reports that her L knee started hurting Friday and Saturday. Better Sunday    Currently in Pain?  Yes    Pain Score  4     Pain Location  Knee    Pain Orientation  Right;Left                      [facility] Adult PT Treatment/Exercise - 12/06/17 0001      Knee/Hip Exercises: Aerobic   Elliptical  I 10 R 5 52frd/2rev    Nustep  L 5 5 min       Knee/Hip Exercises: Machines for Strengthening   Cybex Knee Extension  10# 2 sets 15 add squeeze    Cybex Knee Flexion  35lb 2x15     Cybex Leg Press  40# 3 SETS 15 with ball squeeze; SL 20lb 2x5       Knee/Hip Exercises: Standing   Other Standing Knee Exercises  SL DL 4lb 2x8 each     Other Standing Knee Exercises  Controlled desents 6in  x10 each       Knee/Hip Exercises: Seated   Long Arc Quad  Right;15  reps;2 sets add squeeze       Modalities   Modalities  Cryotherapy      Cryotherapy   Number Minutes Cryotherapy  10 Minutes    Cryotherapy Location  Knee    Type of Cryotherapy  Ice pack               PT Short Term Goals - 11/09/17 0815      PT SHORT TERM GOAL #1   Title  independent with initia HEP    Status  Achieved        PT Long Term Goals - 11/24/17 0812      PT LONG TERM GOAL #1   Title  decrease pain 50%    Status  Achieved            Plan - 12/06/17 0848    Clinical Impression Statement  Pt reports some pain over the weekend that's he thinks came from shoveling snow. No issues reported with  today's exercises. Increase weight tolerated on leg press.     Rehab Potential  Good    PT Frequency  2x / week    PT Duration  8 weeks    PT Treatment/Interventions  Cryotherapy;Electrical Stimulation;Ultrasound;Moist Heat;Iontophoresis 4mg /ml Dexamethasone;Gait training;Functional mobility training;Therapeutic activities;Therapeutic exercise;Balance training;Patient/family education;Neuromuscular re-education;Manual techniques;Vasopneumatic Device    PT Next Visit Plan  VMO strengthening       Patient will benefit from skilled therapeutic intervention in order to improve the following deficits and impairments:  Abnormal gait, Pain, Decreased mobility, Decreased range of motion, Decreased strength, Difficulty walking, Increased edema, Impaired flexibility  Visit Diagnosis: Chronic pain of right knee  Difficulty in walking, not elsewhere classified     Problem List There are no active problems to display for this patient.   Scot Jun, PTA 12/06/2017, 8:50 AM  [address] [address] [place], [place], [address] Phone: [phone]   Fax:  [phone]  Name: Alexee Delsanto MRN: [id number] Date of Birth: [birth year]

## 2017-12-08 ENCOUNTER — Ambulatory Visit: Payer: Non-veteran care | Admitting: Physical Therapy

## 2017-12-08 ENCOUNTER — Encounter: Payer: Self-pay | Admitting: Physical Therapy

## 2017-12-08 DIAGNOSIS — M25552 Pain in left hip: Secondary | ICD-10-CM

## 2017-12-08 DIAGNOSIS — M25561 Pain in right knee: Secondary | ICD-10-CM | POA: Diagnosis not present

## 2017-12-08 DIAGNOSIS — G8929 Other chronic pain: Secondary | ICD-10-CM

## 2017-12-08 DIAGNOSIS — R262 Difficulty in walking, not elsewhere classified: Secondary | ICD-10-CM

## 2017-12-08 NOTE — Therapy (Signed)
[place], [place], [address] Phone: [phone]   Fax:  [phone]  Physical Therapy Treatment  Patient Details  Name: Cynthia Little MRN: [id number] Date of Birth: [birth year] Referring Provider: Ursula Beath   Encounter Date: 12/08/2017  PT End of Session - 12/08/17 1011    Visit Number  8    Number of Visits  14    Date for PT Re-Evaluation  12/27/17    PT Start Time  0800    PT Stop Time  0845    PT Time Calculation (min)  45 min    Activity Tolerance  Patient tolerated treatment well    Behavior During Therapy  [facility] for tasks assessed/performed       Past Medical History:  Diagnosis Date  . Anemia   . Asthma   . Bronchitis     Past Surgical History:  Procedure Laterality Date  . CESAREAN SECTION  08/11/2012   Procedure: CESAREAN SECTION;  Surgeon: Melina Schools, MD;  Location: [facility];  Service: Gynecology;  Laterality: N/A;  . NO PAST SURGERIES      There were no vitals filed for this visit.  Subjective Assessment - 12/08/17 0806    Subjective  Patient continues to have right knee pain.  reports a 6/10, unsure of why it is worse.  Could be the snow last week    Currently in Pain?  Yes    Pain Score  6     Pain Location  Knee    Pain Orientation  Right    Pain Descriptors / Indicators  Sore;Aching    Aggravating Factors   snow last week, shovelling                      [facility] Adult PT Treatment/Exercise - 12/08/17 0001      Knee/Hip Exercises: Stretches   Passive Hamstring Stretch  3 reps;20 seconds    Quad Stretch  3 reps;30 seconds    ITB Stretch  3 reps;30 seconds    Piriformis Stretch  3 reps;20 seconds    Gastroc Stretch  3 reps;20 seconds      Knee/Hip Exercises: Aerobic   Elliptical  I 10 R 6 74fd/2rev    Nustep  L 5 5 min       Knee/Hip Exercises: Machines for Strengthening   Cybex Knee Flexion  35lb 2x15     Cybex Leg Press  40# 3 SETS 15  with ball squeeze; SL 20lb 2x5       Knee/Hip Exercises: Standing   Walking with Sports Cord  resisted gait all directions      Iontophoresis   Type of Iontophoresis  Dexamethasone    Location  medial knee    Dose  828m   Time  4 hour patch               PT Short Term Goals - 11/09/17 0815      PT SHORT TERM GOAL #1   Title  independent with initia HEP    Status  Achieved        PT Long Term Goals - 12/08/17 1012      PT LONG TERM GOAL #3   Title  report no difficulty with stairs and walking    Status  Partially Met      PT LONG TERM GOAL #4   Title  independent with advanced HEP or gym program  Status  On-going            Plan - 12/08/17 1012    Clinical Impression Statement  Patient continues to have some increase right knee pain, patellar tendon area, she is very tight in the LE mms so encouraged her to do stretches.  Tried ionot today    PT Next Visit Plan  flwxibility    Consulted and Agree with Plan of Care  Patient       Patient will benefit from skilled therapeutic intervention in order to improve the following deficits and impairments:  Abnormal gait, Pain, Decreased mobility, Decreased range of motion, Decreased strength, Difficulty walking, Increased edema, Impaired flexibility  Visit Diagnosis: Chronic pain of right knee  Difficulty in walking, not elsewhere classified  Pain in left hip     Problem List There are no active problems to display for this patient.   ALBRIGHT,MICHAEL W., PT 12/08/2017, 10:13 AM  [facility] [address] [place], [place], [address] Phone: [phone]   Fax:  [phone]  Name: Cynthia Little MRN: [id number] Date of Birth: 04/26/1991   

## 2017-12-15 ENCOUNTER — Encounter: Payer: Self-pay | Admitting: Physical Therapy

## 2017-12-15 ENCOUNTER — Ambulatory Visit: Payer: Non-veteran care | Admitting: Physical Therapy

## 2017-12-15 DIAGNOSIS — M25561 Pain in right knee: Secondary | ICD-10-CM

## 2017-12-15 DIAGNOSIS — G8929 Other chronic pain: Secondary | ICD-10-CM

## 2017-12-15 DIAGNOSIS — R262 Difficulty in walking, not elsewhere classified: Secondary | ICD-10-CM

## 2017-12-15 NOTE — Therapy (Signed)
[place], [place], [address] Phone: [phone]   Fax:  [phone]  Physical Therapy Treatment  Patient Details  Name: Cynthia Little MRN: [id number] Date of Birth: November 21, 1991 Referring Provider: Ursula Beath   Encounter Date: 12/15/2017  PT End of Session - 12/15/17 0842    Visit Number  9    Number of Visits  14    Date for PT Re-Evaluation  12/27/17    Authorization Type  [facility],  a no show will stop treatment, no [place] or ionto > 6 visits    PT Start Time  0805    PT Stop Time  0845    PT Time Calculation (min)  40 min    Activity Tolerance  Patient tolerated treatment well    Behavior During Therapy  [facility] for tasks assessed/performed       Past Medical History:  Diagnosis Date  . Anemia   . Asthma   . Bronchitis     Past Surgical History:  Procedure Laterality Date  . CESAREAN SECTION  08/11/2012   Procedure: CESAREAN SECTION;  Surgeon: Melina Schools, MD;  Location: [facility];  Service: Gynecology;  Laterality: N/A;  . NO PAST SURGERIES      There were no vitals filed for this visit.  Subjective Assessment - 12/15/17 0808    Subjective  "IT has been bothering me a lot"    Currently in Pain?  Yes    Pain Score  6     Pain Location  Knee    Pain Orientation  Right;Medial                      [facility] Adult PT Treatment/Exercise - 12/15/17 0001      Knee/Hip Exercises: Stretches   Passive Hamstring Stretch  20 seconds;4 reps    Quad Stretch  3 reps;30 seconds    Piriformis Stretch  20 seconds;4 reps    Gastroc Stretch  3 reps;20 seconds      Knee/Hip Exercises: Aerobic   Elliptical  I 10 R 6 14fd/2rev    Nustep  L 5 5 min       Knee/Hip Exercises: Machines for Strengthening   Cybex Knee Flexion  35lb 2x15     Cybex Leg Press  40# 3 SETS 15 with ball squeeze; SL 20lb 2x5       Knee/Hip Exercises: Standing   Walking with Sports Cord  resisted gait all  directions      Iontophoresis   Type of Iontophoresis  Dexamethasone    Location  medial knee    Dose  848m   Time  4 hour patch               PT Short Term Goals - 11/09/17 0815      PT SHORT TERM GOAL #1   Title  independent with initia HEP    Status  Achieved        PT Long Term Goals - 12/08/17 1012      PT LONG TERM GOAL #3   Title  report no difficulty with stairs and walking    Status  Partially Met      PT LONG TERM GOAL #4   Title  independent with advanced HEP or gym program    Status  On-going            Plan - 12/15/17 0842    Clinical  Impression Statement  Continues with ionto and LE stretching, no issues with today's exercises, pt with good HS flexibility. Advised pt to make a follow up apt. with MD    Rehab Potential  Good    PT Frequency  2x / week    PT Duration  8 weeks    PT Next Visit Plan  Flexibility       Patient will benefit from skilled therapeutic intervention in order to improve the following deficits and impairments:  Abnormal gait, Pain, Decreased mobility, Decreased range of motion, Decreased strength, Difficulty walking, Increased edema, Impaired flexibility  Visit Diagnosis: Difficulty in walking, not elsewhere classified  Chronic pain of right knee     Problem List There are no active problems to display for this patient.   Scot Jun, PTA 12/15/2017, 8:44 AM  [address] [address] [place], [place], [address] Phone: [phone]   Fax:  [phone]  Name: Cynthia Little MRN: [id number] Date of Birth: [birth year]

## 2017-12-17 ENCOUNTER — Ambulatory Visit: Payer: Non-veteran care | Admitting: Physical Therapy

## 2017-12-17 ENCOUNTER — Encounter: Payer: Self-pay | Admitting: Physical Therapy

## 2017-12-17 DIAGNOSIS — G8929 Other chronic pain: Secondary | ICD-10-CM

## 2017-12-17 DIAGNOSIS — R262 Difficulty in walking, not elsewhere classified: Secondary | ICD-10-CM

## 2017-12-17 DIAGNOSIS — M25561 Pain in right knee: Secondary | ICD-10-CM

## 2017-12-17 NOTE — Therapy (Signed)
[place], [place], [address] Phone: [phone]   Fax:  [phone]  Physical Therapy Treatment  Patient Details  Name: Cynthia Little MRN: [id number] Date of Birth: [birth year] Referring Provider: Ursula Beath   Encounter Date: 12/17/2017  PT End of Session - 12/17/17 0841    Visit Number  10    Date for PT Re-Evaluation  12/27/17    PT Start Time  0800    PT Stop Time  0855    PT Time Calculation (min)  55 min    Activity Tolerance  Patient tolerated treatment well    Behavior During Therapy  [facility] for tasks assessed/performed       Past Medical History:  Diagnosis Date  . Anemia   . Asthma   . Bronchitis     Past Surgical History:  Procedure Laterality Date  . CESAREAN SECTION  08/11/2012   Procedure: CESAREAN SECTION;  Surgeon: Melina Schools, MD;  Location: [facility];  Service: Gynecology;  Laterality: N/A;  . NO PAST SURGERIES      There were no vitals filed for this visit.  Subjective Assessment - 12/17/17 0807    Subjective  Reports some increase of pain, just reports that she is more active    Currently in Pain?  Yes    Pain Score  6     Pain Location  Knee    Pain Orientation  Right;Medial    Aggravating Factors   activity    Pain Relieving Factors  rest                      [facility] Adult PT Treatment/Exercise - 12/17/17 0001      Knee/Hip Exercises: Stretches   Passive Hamstring Stretch  20 seconds;4 reps    Quad Stretch  3 reps;30 seconds    Hip Flexor Stretch  3 reps;20 seconds    ITB Stretch  4 reps;20 seconds    Piriformis Stretch  20 seconds;4 reps    Gastroc Stretch  3 reps;20 seconds      Knee/Hip Exercises: Aerobic   Elliptical  I 10 R 6 5 minutes    Recumbent Bike  Level 2 x 5 minutes      Knee/Hip Exercises: Standing   Other Standing Knee Exercises  SL DL 4lb 2x8 each     Other Standing Knee Exercises  Controlled desents 6in  x10 each        Knee/Hip Exercises: Supine   Quad Sets  3 sets;10 reps    Short Arc Quad Sets  3 sets;10 reps 3#      Iontophoresis   Type of Iontophoresis  Dexamethasone    Location  medial knee    Dose  60m             PT Education - 12/17/17 0840    Education provided  Yes    Education Details  Reissued two ways to stretch ITB, refocused her on the TKE, SAQ       PT Short Term Goals - 11/09/17 0815      PT SHORT TERM GOAL #1   Title  independent with initia HEP    Status  Achieved        PT Long Term Goals - 12/17/17 0843      PT LONG TERM GOAL #1   Title  decrease pain 50%    Status  Achieved  PT LONG TERM GOAL #2   Title  increase right knee flexion to 110 degrees    Status  Achieved      PT LONG TERM GOAL #3   Title  report no difficulty with stairs and walking    Status  Partially Met      PT LONG TERM GOAL #4   Title  independent with advanced HEP or gym program    Status  Partially Met            Plan - 12/17/17 0842    Clinical Impression Statement  Refocused her on VMO activation and ITB stretches for home, still lateral tracking    PT Next Visit Plan  ITB stretches and VMO activation    Consulted and Agree with Plan of Care  Patient       Patient will benefit from skilled therapeutic intervention in order to improve the following deficits and impairments:  Abnormal gait, Pain, Decreased mobility, Decreased range of motion, Decreased strength, Difficulty walking, Increased edema, Impaired flexibility  Visit Diagnosis: Difficulty in walking, not elsewhere classified  Chronic pain of right knee     Problem List There are no active problems to display for this patient.   Sumner Boast., PT 12/17/2017, 8:44 AM  [address] [place], [place], [address] Phone: [phone]   Fax:  [phone]  Name: Cynthia Little MRN: [id number] Date of Birth:  09-Jul-1991

## 2017-12-22 ENCOUNTER — Encounter: Payer: Self-pay | Admitting: Physical Therapy

## 2017-12-22 ENCOUNTER — Ambulatory Visit: Payer: Non-veteran care | Attending: Family | Admitting: Physical Therapy

## 2017-12-22 DIAGNOSIS — M25552 Pain in left hip: Secondary | ICD-10-CM | POA: Diagnosis present

## 2017-12-22 DIAGNOSIS — R262 Difficulty in walking, not elsewhere classified: Secondary | ICD-10-CM | POA: Diagnosis not present

## 2017-12-22 DIAGNOSIS — G8929 Other chronic pain: Secondary | ICD-10-CM | POA: Diagnosis present

## 2017-12-22 DIAGNOSIS — M25561 Pain in right knee: Secondary | ICD-10-CM | POA: Diagnosis present

## 2017-12-22 NOTE — Therapy (Signed)
[place], [place], [address] Phone: [phone]   Fax:  [phone]  Physical Therapy Treatment  Patient Details  Name: Cynthia Little MRN: [id number] Date of Birth: 27-Sep-1991 Referring Provider: Ursula Beath   Encounter Date: 12/22/2017  PT End of Session - 12/22/17 0846    Visit Number  11    Date for PT Re-Evaluation  12/27/17    PT Start Time  0800    PT Stop Time  0846    PT Time Calculation (min)  46 min       Past Medical History:  Diagnosis Date  . Anemia   . Asthma   . Bronchitis     Past Surgical History:  Procedure Laterality Date  . CESAREAN SECTION  08/11/2012   Procedure: CESAREAN SECTION;  Surgeon: Melina Schools, MD;  Location: [facility];  Service: Gynecology;  Laterality: N/A;  . NO PAST SURGERIES      There were no vitals filed for this visit.  Subjective Assessment - 12/22/17 0808    Subjective  Pt reports that she is doing well, but had to ice her knee a lot yesterday because it was bothering her.    Currently in Pain?  Yes    Pain Score  5     Pain Location  Knee    Pain Orientation  Right                      [facility] Adult PT Treatment/Exercise - 12/22/17 0001      Knee/Hip Exercises: Aerobic   Elliptical  I 10 R 6 5 minutes    Recumbent Bike  Level 2 x 5 minutes      Knee/Hip Exercises: Standing   Hip ADduction  Both;2 sets;10 reps 5lb    Other Standing Knee Exercises  SL DL 4lb 2x8 each     Other Standing Knee Exercises  Controlled desents 6in  2x10 each       Knee/Hip Exercises: Supine   Straight Leg Raises  Both;2 sets;10 reps with add    Straight Leg Raise with External Rotation  2 sets;10 reps;Both      Iontophoresis   Type of Iontophoresis  Dexamethasone    Location  medial knee    Dose  23m    Time  4 hour patch               PT Short Term Goals - 11/09/17 0815      PT SHORT TERM GOAL #1   Title  independent  with initia HEP    Status  Achieved        PT Long Term Goals - 12/17/17 0843      PT LONG TERM GOAL #1   Title  decrease pain 50%    Status  Achieved      PT LONG TERM GOAL #2   Title  increase right knee flexion to 110 degrees    Status  Achieved      PT LONG TERM GOAL #3   Title  report no difficulty with stairs and walking    Status  Partially Met      PT LONG TERM GOAL #4   Title  independent with advanced HEP or gym program    Status  Partially Met            Plan - 12/22/17 1015    Clinical Impression Statement  Continued to  focus on VMO activation. no reports of pain with today's activity.    Rehab Potential  Good    PT Frequency  2x / week    PT Duration  8 weeks    PT Treatment/Interventions  Cryotherapy;Electrical Stimulation;Ultrasound;Moist Heat;Iontophoresis 12m/ml Dexamethasone;Gait training;Functional mobility training;Therapeutic activities;Therapeutic exercise;Balance training;Patient/family education;Neuromuscular re-education;Manual techniques;Vasopneumatic Device    PT Next Visit Plan  ITB stretches and VMO activation       Patient will benefit from skilled therapeutic intervention in order to improve the following deficits and impairments:  Abnormal gait, Pain, Decreased mobility, Decreased range of motion, Decreased strength, Difficulty walking, Increased edema, Impaired flexibility  Visit Diagnosis: Difficulty in walking, not elsewhere classified  Chronic pain of right knee  Pain in left hip     Problem List There are no active problems to display for this patient.   RScot Jun PTA 12/22/2017, 10:16 AM  [address] [address]Phone: [phone]  Fax:  [phone] Name: JTerrisha LopataMRN: [id number] of Birth: 1October 29, 1992

## 2017-12-24 ENCOUNTER — Ambulatory Visit: Payer: Non-veteran care | Admitting: Physical Therapy

## 2017-12-24 ENCOUNTER — Encounter: Payer: Self-pay | Admitting: Physical Therapy

## 2017-12-24 DIAGNOSIS — M25561 Pain in right knee: Secondary | ICD-10-CM

## 2017-12-24 DIAGNOSIS — R262 Difficulty in walking, not elsewhere classified: Secondary | ICD-10-CM | POA: Diagnosis not present

## 2017-12-24 DIAGNOSIS — G8929 Other chronic pain: Secondary | ICD-10-CM

## 2017-12-24 NOTE — Therapy (Signed)
[place], [place], [address] Phone: [phone]   Fax:  [phone]  Physical Therapy Treatment  Patient Details  Name: Cynthia Little MRN: [id number] Date of Birth: [birth year] Referring Provider: Ursula Beath   Encounter Date: 12/24/2017  PT End of Session - 12/24/17 0845    Visit Number  12    Number of Visits  14    Date for PT Re-Evaluation  12/27/17    PT Start Time  0815    PT Stop Time  0900    PT Time Calculation (min)  45 min       Past Medical History:  Diagnosis Date  . Anemia   . Asthma   . Bronchitis     Past Surgical History:  Procedure Laterality Date  . CESAREAN SECTION  08/11/2012   Procedure: CESAREAN SECTION;  Surgeon: Melina Schools, MD;  Location: [facility];  Service: Gynecology;  Laterality: N/A;  . NO PAST SURGERIES      There were no vitals filed for this visit.  Subjective Assessment - 12/24/17 0812    Subjective  therapy is helping but knee is achey and swelling inside I dont know why? hip okay unless knee very bad and changes my walk, hip hurts    Currently in Pain?  Yes    Pain Score  8     Pain Location  Knee    Pain Orientation  Right         [facility] PT Assessment - 12/24/17 0001      AROM   AROM Assessment Site  Knee    Right/Left Knee  Right    Right Knee Extension  0    Right Knee Flexion  120                  [facility] Adult PT Treatment/Exercise - 12/24/17 0001      Knee/Hip Exercises: Aerobic   Elliptical  I 10 R 6  3 fwd/3 back    Nustep  L 4 6 min LE only      Knee/Hip Exercises: Machines for Strengthening   Cybex Knee Extension  10# 2 sets 15 VMO ball squeeze    Cybex Leg Press  40# 3 SETS 15 with ball squeeze; SL 20lb 2x5       Knee/Hip Exercises: Standing   Walking with Sports Cord  resisted gait all directions with step up at end    Other Standing Knee Exercises  SL DL 5lb 2x10 each     Other Standing Knee Exercises   Controlled desents 6in  2x15 each  6 inch add ( VMO) step up 2 sets 10      Iontophoresis   Type of Iontophoresis  Dexamethasone    Location  medial knee    Dose  40m    Time  4 hour patch               PT Short Term Goals - 11/09/17 0815      PT SHORT TERM GOAL #1   Title  independent with initia HEP    Status  Achieved        PT Long Term Goals - 12/24/17 0816      PT LONG TERM GOAL #1   Title  decrease pain 50%    Status  Achieved      PT LONG TERM GOAL #2   Title  increase right knee flexion to 110  degrees    Status  Achieved      PT LONG TERM GOAL #3   Title  report no difficulty with stairs and walking    Status  Partially Met      PT LONG TERM GOAL #4   Title  independent with advanced HEP or gym program    Status  Partially Met            Plan - 12/24/17 0848    Clinical Impression Statement  tolerated ther ex without issue, decreased ecc control with some pain. progressing with goals    PT Treatment/Interventions  Cryotherapy;Electrical Stimulation;Ultrasound;Moist Heat;Iontophoresis 62m/ml Dexamethasone;Gait training;Functional mobility training;Therapeutic activities;Therapeutic exercise;Balance training;Patient/family education;Neuromuscular re-education;Manual techniques;Vasopneumatic Device    PT Next Visit Plan  1 more visit monday then HOLD until sees MD as cert period up 15/2/48      Patient will benefit from skilled therapeutic intervention in order to improve the following deficits and impairments:  Abnormal gait, Pain, Decreased mobility, Decreased range of motion, Decreased strength, Difficulty walking, Increased edema, Impaired flexibility  Visit Diagnosis: Difficulty in walking, not elsewhere classified  Chronic pain of right knee     Problem List There are no active problems to display for this patient.   Zoraida Havrilla,ANGIE PTA 12/24/2017, 8:50 AM  [address] [address]Phone: [phone]  Fax:  [phone] Name: Cynthia EriksenMRN: [id number] of Birth: 1Mar 04, 1992

## 2017-12-27 ENCOUNTER — Ambulatory Visit: Payer: Non-veteran care | Admitting: Physical Therapy

## 2017-12-27 ENCOUNTER — Encounter: Payer: Self-pay | Admitting: Physical Therapy

## 2017-12-27 DIAGNOSIS — G8929 Other chronic pain: Secondary | ICD-10-CM

## 2017-12-27 DIAGNOSIS — M25561 Pain in right knee: Principal | ICD-10-CM

## 2017-12-27 DIAGNOSIS — R262 Difficulty in walking, not elsewhere classified: Secondary | ICD-10-CM | POA: Diagnosis not present

## 2017-12-27 DIAGNOSIS — M25552 Pain in left hip: Secondary | ICD-10-CM

## 2017-12-27 NOTE — Therapy (Signed)
[place], [place], [address] Phone: [phone]   Fax:  [phone]  Physical Therapy Treatment  Patient Details  Name: Cynthia Little MRN: [id number] Date of Birth: 06-03-1991 Referring Provider: Ursula Beath   Encounter Date: 12/27/2017  PT End of Session - 12/27/17 1015    Visit Number  13    Date for PT Re-Evaluation  12/27/17    PT Start Time  0935    PT Stop Time  1025    PT Time Calculation (min)  50 min    Activity Tolerance  Patient tolerated treatment well    Behavior During Therapy  [facility] for tasks assessed/performed       Past Medical History:  Diagnosis Date  . Anemia   . Asthma   . Bronchitis     Past Surgical History:  Procedure Laterality Date  . CESAREAN SECTION  08/11/2012   Procedure: CESAREAN SECTION;  Surgeon: Melina Schools, MD;  Location: [facility];  Service: Gynecology;  Laterality: N/A;  . NO PAST SURGERIES      There were no vitals filed for this visit.  Subjective Assessment - 12/27/17 0935    Subjective  "I am feeling pretty good today"    Currently in Pain?  No/denies    Pain Score  0-No pain         [facility] PT Assessment - 12/27/17 0001      AROM   AROM Assessment Site  Knee    Right/Left Knee  Right;Left    Right Knee Extension  0    Right Knee Flexion  120    Left Knee Extension  0    Left Knee Flexion  121                  [facility] Adult PT Treatment/Exercise - 12/27/17 0001      Ambulation/Gait   Gait Comments  1 flight alternating pattern       Knee/Hip Exercises: Aerobic   Elliptical  I 10 R 6  3 fwd/3 back    Nustep  L 4 6 min LE only      Knee/Hip Exercises: Machines for Strengthening   Cybex Knee Extension  10# 2 sets 15 VMO ball squeeze    Cybex Leg Press  40# 3 SETS 15 with ball squeeze; SL 20lb 2x10       Knee/Hip Exercises: Standing   Other Standing Knee Exercises  SL DL 5lb 2x10 each       Modalities   Modalities   Cryotherapy      Cryotherapy   Number Minutes Cryotherapy  10 Minutes    Cryotherapy Location  Knee    Type of Cryotherapy  Ice pack               PT Short Term Goals - 11/09/17 0815      PT SHORT TERM GOAL #1   Title  independent with initia HEP    Status  Achieved        PT Long Term Goals - 12/27/17 0953      PT LONG TERM GOAL #1   Title  decrease pain 50%    Status  Achieved      PT LONG TERM GOAL #2   Title  increase right knee flexion to 110 degrees    Status  Achieved      PT LONG TERM GOAL #3   Title  report no difficulty  with stairs and walking    Status  Partially Met "I tense up my R knee when going down "            Plan - 12/27/17 1015    Clinical Impression Statement  most goals met, does reports some pain when going downs stairs    Rehab Potential  Good    PT Frequency  2x / week    PT Duration  8 weeks    PT Next Visit Plan  D/C cert period up 01/29/55       Patient will benefit from skilled therapeutic intervention in order to improve the following deficits and impairments:  Abnormal gait, Pain, Decreased mobility, Decreased range of motion, Decreased strength, Difficulty walking, Increased edema, Impaired flexibility  Visit Diagnosis: Chronic pain of right knee  Difficulty in walking, not elsewhere classified  Pain in left hip     Problem List There are no active problems to display for this patient.  PHYSICAL THERAPY DISCHARGE SUMMARY  Visits from Start of Care:  Plan: Patient agrees to discharge.  Patient goals were not met. Patient is being discharged due to financial reasons.  ?????    Scot Jun, PTA 12/27/2017, 10:21 AM  [address] [place], [place], [address] Phone: [phone]   Fax:  [phone]  Name: Cynthia Little MRN: [id number] Date of Birth: 03-Jul-1991

## 2018-09-07 ENCOUNTER — Emergency Department (HOSPITAL_COMMUNITY): Payer: No Typology Code available for payment source

## 2018-09-07 ENCOUNTER — Other Ambulatory Visit: Payer: Self-pay

## 2018-09-07 ENCOUNTER — Emergency Department (HOSPITAL_COMMUNITY)
Admission: EM | Admit: 2018-09-07 | Discharge: 2018-09-07 | Disposition: A | Discharge status: Home / Self Care | Payer: No Typology Code available for payment source | Attending: Emergency Medicine | Admitting: Emergency Medicine

## 2018-09-07 DIAGNOSIS — Y939 Activity, unspecified: Secondary | ICD-10-CM | POA: Diagnosis not present

## 2018-09-07 DIAGNOSIS — Z041 Encounter for examination and observation following transport accident: Secondary | ICD-10-CM | POA: Diagnosis present

## 2018-09-07 DIAGNOSIS — Y929 Unspecified place or not applicable: Secondary | ICD-10-CM | POA: Diagnosis not present

## 2018-09-07 DIAGNOSIS — M542 Cervicalgia: Secondary | ICD-10-CM | POA: Diagnosis not present

## 2018-09-07 DIAGNOSIS — Y999 Unspecified external cause status: Secondary | ICD-10-CM | POA: Diagnosis not present

## 2018-09-07 DIAGNOSIS — M7918 Myalgia, other site: Secondary | ICD-10-CM | POA: Diagnosis not present

## 2018-09-07 LAB — POC URINE PREG, ED: Preg Test, Ur: NEGATIVE

## 2018-09-07 MED ORDER — NAPROXEN 500 MG PO TABS: 500.0000 mg | ORAL_TABLET | Freq: Two times a day (BID) | ORAL | 0 refills | Status: DC

## 2018-09-07 MED ORDER — METHOCARBAMOL 500 MG PO TABS: 500.0000 mg | ORAL_TABLET | Freq: Two times a day (BID) | ORAL | 0 refills | Status: DC

## 2018-09-07 MED ORDER — KETOROLAC TROMETHAMINE 15 MG/ML IJ SOLN
30.0000 mg | Freq: Once | INTRAMUSCULAR | Status: AC
  Administered 2018-09-07: 30 mg via INTRAMUSCULAR
  Filled 2018-09-07: qty 2
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
## 2018-09-28 ENCOUNTER — Other Ambulatory Visit: Payer: Self-pay

## 2018-09-29 ENCOUNTER — Ambulatory Visit (INDEPENDENT_AMBULATORY_CARE_PROVIDER_SITE_OTHER): Payer: PRIVATE HEALTH INSURANCE

## 2018-09-29 ENCOUNTER — Ambulatory Visit (INDEPENDENT_AMBULATORY_CARE_PROVIDER_SITE_OTHER): Payer: Non-veteran care

## 2018-09-29 ENCOUNTER — Telehealth: Payer: Self-pay | Admitting: *Deleted

## 2018-09-29 ENCOUNTER — Other Ambulatory Visit: Payer: Self-pay | Admitting: Podiatry

## 2018-09-29 ENCOUNTER — Ambulatory Visit (INDEPENDENT_AMBULATORY_CARE_PROVIDER_SITE_OTHER): Payer: Non-veteran care | Admitting: Podiatry

## 2018-09-29 ENCOUNTER — Encounter: Payer: Self-pay | Admitting: Podiatry

## 2018-09-29 DIAGNOSIS — M722 Plantar fascial fibromatosis: Secondary | ICD-10-CM

## 2018-09-29 DIAGNOSIS — M216X9 Other acquired deformities of unspecified foot: Secondary | ICD-10-CM

## 2018-09-29 DIAGNOSIS — M7732 Calcaneal spur, left foot: Secondary | ICD-10-CM | POA: Diagnosis not present

## 2018-09-29 DIAGNOSIS — M7731 Calcaneal spur, right foot: Secondary | ICD-10-CM

## 2018-09-29 DIAGNOSIS — M79671 Pain in right foot: Secondary | ICD-10-CM

## 2018-09-29 DIAGNOSIS — M7661 Achilles tendinitis, right leg: Secondary | ICD-10-CM

## 2018-09-29 DIAGNOSIS — M7662 Achilles tendinitis, left leg: Secondary | ICD-10-CM | POA: Diagnosis not present

## 2018-09-29 DIAGNOSIS — M9261 Juvenile osteochondrosis of tarsus, right ankle: Secondary | ICD-10-CM

## 2018-09-29 DIAGNOSIS — M9262 Juvenile osteochondrosis of tarsus, left ankle: Secondary | ICD-10-CM

## 2018-09-29 DIAGNOSIS — M79672 Pain in left foot: Secondary | ICD-10-CM

## 2018-09-29 MED ORDER — METHYLPREDNISOLONE 4 MG PO TBPK: ORAL_TABLET | ORAL | 0 refills | Status: DC

## 2018-09-29 MED ORDER — MELOXICAM 15 MG PO TABS: 15.0000 mg | ORAL_TABLET | Freq: Every day | ORAL | 0 refills | Status: DC

## 2018-09-29 NOTE — Patient Instructions (Signed)

## 2018-09-29 NOTE — Telephone Encounter (Signed)
-----   Message from Evelina Bucy, DPM sent at 09/29/2018  9:28 AM EDT ----- Can we refer to the [facility] for PT?

## 2018-09-29 NOTE — Telephone Encounter (Signed)
Faxed required form, clinical of 09/29/2018 when available and demographics to [facility] with rx for PT for evaluation and treatment B/L foot and Achilles tendon pain.

## 2018-09-30 NOTE — Progress Notes (Signed)
Subjective:  Patient ID: Cynthia Little, female    DOB: 1991-01-30,  MRN: [id number]  Chief Complaint  Patient presents with  . Foot Pain    Pt states heel pain in both feet, pain is occasionally sharp and stabbing and radiates from the ball of the heel through the achilles tendon and also towards the toes on the bottom of the foot. Duration 5 months. Pt has been treating with heel cups, ice, ibuprofen and stretching.   27 y.o. female presents with the above complaint.  Above history reviewed with patient.  Review of Systems: Negative except as noted in the HPI. Denies N/V/F/Ch.  Past Medical History:  Diagnosis Date  . Anemia   . Asthma   . Bronchitis     Current Outpatient Medications:  .  fluconazole (DIFLUCAN) 150 MG tablet, TAKE 1 TABLET BY MOUTH AS ONE DOSE, Disp: , Rfl: 1 .  fluticasone (FLONASE) 50 MCG/ACT nasal spray, 2 sprays in each nostril daily x1 week then 1-2 sprays in each nostril daily. (use smallest dose possible for symptom control after week 1)., Disp: , Rfl:  .  Hypertonic Nasal Wash (SINUS RINSE) PACK, use as directed on packaging; Use distilled or sterile water, Disp: , Rfl:  .  levalbuterol (XOPENEX HFA) 45 MCG/ACT inhaler, 1-2 puffs inhaled every 4-6 hours as needed., Disp: , Rfl:  .  levofloxacin (LEVAQUIN) 500 MG tablet, Take by mouth., Disp: , Rfl:  .  meloxicam (MOBIC) 15 MG tablet, Take 1 tablet (15 mg total) by mouth daily., Disp: 30 tablet, Rfl: 0 .  methocarbamol (ROBAXIN) 500 MG tablet, Take 1 tablet (500 mg total) by mouth 2 (two) times daily. (Patient not taking: Reported on 09/29/2018), Disp: 20 tablet, Rfl: 0 .  methylPREDNISolone (MEDROL DOSEPAK) 4 MG TBPK tablet, 6 Day Taper Pack. Take as Directed., Disp: 21 tablet, Rfl: 0 .  naproxen (NAPROSYN) 500 MG tablet, Take 1 tablet (500 mg total) by mouth 2 (two) times daily. (Patient not taking: Reported on 09/29/2018), Disp: 30 tablet, Rfl: 0 .  predniSONE (DELTASONE) 1 MG tablet, Take by mouth.,  Disp: , Rfl:  .  Prenatal Vit-Fe Fumarate-FA (PRENATAL MULTIVITAMIN) TABS, Take 1 tablet by mouth daily., Disp: , Rfl:   Social History   Tobacco Use  Smoking Status Never Smoker    Allergies  Allergen Reactions  . Cephalexin Hives and Other (See Comments)    Hyperventilate    Objective:  There were no vitals filed for this visit. There is no height or weight on file to calculate BMI. Constitutional Well developed. Well nourished.  Vascular Dorsalis pedis pulses palpable bilaterally. Posterior tibial pulses palpable bilaterally. Capillary refill normal to all digits.  No cyanosis or clubbing noted. Pedal hair growth normal.  Neurologic Normal speech. Oriented to person, place, and time. Epicritic sensation to light touch grossly present bilaterally.  Dermatologic Nails well groomed and normal in appearance. No open wounds. No skin lesions.  Orthopedic: Normal joint ROM without pain or crepitus bilaterally. No visible deformities. Tender to palpation at the posterior calcaneus bilaterally. No pain with calcaneal squeeze bilaterally. Ankle ROM diminished range of motion bilaterally. Silfverskiold Test: positive bilaterally.   Radiographs: Taken and reviewed. No acute fractures or dislocations. No evidence of stress fracture.  Plantar heel spur absent. Posterior heel spur present.  Assessment:   1. Tendonitis, Achilles, left   2. Tendonitis, Achilles, right   3. Heel spur, left   4. Heel spur, right   5. Haglund's deformity of both heels  6. Equinus deformity of foot    Plan:  Patient was evaluated and treated and all questions answered.  Achilles Tendonitis, bilaterally -XR reviewed as above. -Educated on stretching and icing of the affected limb. -Rx for night splint given to take to the [place]. -Referral placed to physical therapy. -eRx for Medrol 6-day taper. Advised on how to take medication. -eRx for Meloxicam. Advised only to take upon completion of oral  steroid taper.  Return in about 6 weeks (around 11/10/2018).

## 2018-09-30 NOTE — Progress Notes (Signed)
Clinicals done 

## 2018-10-30 ENCOUNTER — Other Ambulatory Visit: Payer: Self-pay | Admitting: Podiatry

## 2018-11-07 ENCOUNTER — Telehealth: Payer: Self-pay | Admitting: Podiatry

## 2018-11-07 NOTE — Telephone Encounter (Signed)
Rhona with [facility] called requesting office visit note from 10 October be faxed to her. Fax number is [phone].

## 2018-11-10 ENCOUNTER — Encounter: Payer: Self-pay | Admitting: Podiatry

## 2018-11-10 ENCOUNTER — Ambulatory Visit (INDEPENDENT_AMBULATORY_CARE_PROVIDER_SITE_OTHER): Payer: PRIVATE HEALTH INSURANCE | Admitting: Podiatry

## 2018-11-10 DIAGNOSIS — M7752 Other enthesopathy of left foot: Secondary | ICD-10-CM

## 2018-11-10 DIAGNOSIS — M7731 Calcaneal spur, right foot: Secondary | ICD-10-CM | POA: Diagnosis not present

## 2018-11-10 DIAGNOSIS — M7661 Achilles tendinitis, right leg: Secondary | ICD-10-CM

## 2018-11-10 DIAGNOSIS — M7732 Calcaneal spur, left foot: Secondary | ICD-10-CM

## 2018-11-10 DIAGNOSIS — M205X2 Other deformities of toe(s) (acquired), left foot: Secondary | ICD-10-CM

## 2018-11-10 DIAGNOSIS — M7662 Achilles tendinitis, left leg: Secondary | ICD-10-CM

## 2018-11-13 NOTE — Progress Notes (Signed)
Subjective:  Patient ID: Cynthia Little, female    DOB: 1991-10-28,  MRN: [id number]  Chief Complaint  Patient presents with  . Tendonitis    bilateral follow up; pt stated," was doing good for the first 5wks; pain started back up in my heels in the last week"  . Toe Pain    left foot great toe; pt stated, "only when flexing toe pain hits; feels like when you jam your finger"   27 y.o. female presents with the above complaint.  Above history reviewed with patient.  Review of Systems: Negative except as noted in the HPI. Denies N/V/F/Ch.  Past Medical History:  Diagnosis Date  . Anemia   . Asthma   . Bronchitis     Current Outpatient Medications:  .  fluconazole (DIFLUCAN) 150 MG tablet, TAKE 1 TABLET BY MOUTH AS ONE DOSE, Disp: , Rfl: 1 .  fluticasone (FLONASE) 50 MCG/ACT nasal spray, 2 sprays in each nostril daily x1 week then 1-2 sprays in each nostril daily. (use smallest dose possible for symptom control after week 1)., Disp: , Rfl:  .  Hypertonic Nasal Wash (SINUS RINSE) PACK, use as directed on packaging; Use distilled or sterile water, Disp: , Rfl:  .  levalbuterol (XOPENEX HFA) 45 MCG/ACT inhaler, 1-2 puffs inhaled every 4-6 hours as needed., Disp: , Rfl:  .  levofloxacin (LEVAQUIN) 500 MG tablet, Take by mouth., Disp: , Rfl:  .  meloxicam (MOBIC) 15 MG tablet, TAKE 1 TABLET BY MOUTH EVERY DAY *TAKE AFTER COMPLETION OF STEROID PACK, Disp: 30 tablet, Rfl: 0 .  methocarbamol (ROBAXIN) 500 MG tablet, Take 1 tablet (500 mg total) by mouth 2 (two) times daily., Disp: 20 tablet, Rfl: 0 .  methylPREDNISolone (MEDROL DOSEPAK) 4 MG TBPK tablet, 6 Day Taper Pack. Take as Directed., Disp: 21 tablet, Rfl: 0 .  naproxen (NAPROSYN) 500 MG tablet, Take 1 tablet (500 mg total) by mouth 2 (two) times daily., Disp: 30 tablet, Rfl: 0 .  predniSONE (DELTASONE) 1 MG tablet, Take by mouth., Disp: , Rfl:  .  Prenatal Vit-Fe Fumarate-FA (PRENATAL MULTIVITAMIN) TABS, Take 1 tablet by mouth daily.,  Disp: , Rfl:   Social History   Tobacco Use  Smoking Status Never Smoker  Smokeless Tobacco Never Used    Allergies  Allergen Reactions  . Cephalexin Hives and Other (See Comments)    Hyperventilate    Objective:  There were no vitals filed for this visit. There is no height or weight on file to calculate BMI. Constitutional Well developed. Well nourished.  Vascular Dorsalis pedis pulses palpable bilaterally. Posterior tibial pulses palpable bilaterally. Capillary refill normal to all digits.  No cyanosis or clubbing noted. Pedal hair growth normal.  Neurologic Normal speech. Oriented to person, place, and time. Epicritic sensation to light touch grossly present bilaterally.  Dermatologic Nails well groomed and normal in appearance. No open wounds. No skin lesions.  Orthopedic: Normal joint ROM without pain or crepitus bilaterally. No visible deformities. Tender to palpation at the posterior calcaneus bilaterally. No pain with calcaneal squeeze bilaterally. Ankle ROM diminished range of motion bilaterally. Silfverskiold Test: positive bilaterally. Pain palpation about the left first MPJ   Radiographs: Taken and reviewed. No acute fractures or dislocations. No evidence of stress fracture.  Plantar heel spur absent. Posterior heel spur present.  Assessment:   1. Tendonitis, Achilles, left   2. Tendonitis, Achilles, right   3. Heel spur, left   4. Heel spur, right   5. Hallux limitus of left  foot    Plan:  Patient was evaluated and treated and all questions answered.  Achilles Tendonitis -Continue stretching and icing.  Refer to PT.  Left first MPJ capsulitis -Educated on etiology -Injection delivered as below  Procedure: Joint Injection Location: Left first MPJ joint Skin Prep: Alcohol. Injectate: 0.5 cc 1% lidocaine plain, 0.5 cc dexamethasone phosphate. Disposition: Patient tolerated procedure well. Injection site dressed with a band-aid.  Return in  about 6 weeks (around 12/22/2018) for Achilles Tenodnitis f/u.

## 2018-12-06 ENCOUNTER — Ambulatory Visit: Payer: No Typology Code available for payment source | Attending: Family | Admitting: Physical Therapy

## 2018-12-06 ENCOUNTER — Other Ambulatory Visit: Payer: Self-pay

## 2018-12-06 DIAGNOSIS — M25561 Pain in right knee: Secondary | ICD-10-CM | POA: Insufficient documentation

## 2018-12-06 DIAGNOSIS — G8929 Other chronic pain: Secondary | ICD-10-CM | POA: Diagnosis present

## 2018-12-06 DIAGNOSIS — R262 Difficulty in walking, not elsewhere classified: Secondary | ICD-10-CM | POA: Diagnosis present

## 2018-12-06 DIAGNOSIS — M25552 Pain in left hip: Secondary | ICD-10-CM | POA: Diagnosis present

## 2018-12-06 DIAGNOSIS — M25562 Pain in left knee: Secondary | ICD-10-CM | POA: Diagnosis present

## 2018-12-06 NOTE — Patient Instructions (Signed)
  Hamstring Stretch, Reclined (Strap, Doorframe)  Piriformis (Supine)   Lengthen bottom leg on floor. Extend top leg along edge of doorframe or press foot up into yoga strap. Hold for 30 seconds. Repeat 3_ times each leg.  Marland KitchenKNEE: Quadriceps - Prone    Place strap around ankle. Bring ankle toward buttocks. Press hip into surface. Hold _60__ seconds. __2_ reps per set, ___ sets per day, 7__ days per week   Hip Stretch  Put right ankle over left knee. Let right knee fall downward, but keep ankle in place. Feel the stretch in hip. May push down gently with hand to feel stretch. Hold __60__ seconds while counting out loud. Repeat with other leg. Repeat _2___ times. Do __2__ sessions per day.    Hip Flexor Stretch   Kneel as shown. Interlace fingers on top of right knee. Keeping trunk straight and contracting abdominal muscles, slowly shift weight forward. Continue breathing normally and hold position for 30 -60 seconds. Repeat on other leg. Alternate sides _3__ times. Do _2-3__ times per day.  Madelyn Flavors, PT 12/06/18 10:13 AM; [address] [place], [place], [address] Phone: [phone]   Fax:  [phone]

## 2018-12-06 NOTE — Therapy (Signed)
[address] [place], [place], [address] Phone: [phone]   Fax:  [phone]  Physical Therapy Evaluation  Patient Details  Name: Cynthia Little MRN: [id number] Date of Birth: [birth year] Referring Provider (PT): Tito Dine   Encounter Date: 12/06/2018  PT End of Session - 12/06/18 0938    Visit Number  1    Number of Visits  15    Date for PT Re-Evaluation  01/31/19    Authorization Type  [facility] - 15 visits approved    PT Start Time  0936    PT Stop Time  1014    PT Time Calculation (min)  38 min    Activity Tolerance  Patient tolerated treatment well    Behavior During Therapy  [facility] for tasks assessed/performed       Past Medical History:  Diagnosis Date  . Anemia   . Asthma   . Bronchitis     Past Surgical History:  Procedure Laterality Date  . CESAREAN SECTION  08/11/2012   Procedure: CESAREAN SECTION;  Surgeon: Melina Schools, MD;  Location: [facility];  Service: Gynecology;  Laterality: N/A;  . NO PAST SURGERIES      There were no vitals filed for this visit.   Subjective Assessment - 12/06/18 0940    Subjective  Patient has long h/o of knee pain. This resolved with PT but then her heels began hurting. She has bone spurs and podiatrist advised higher heels. This helped the heels but now knee pain is back. This began in June 2019. Pain is mainly after sitting for a while or after prolonged weight bearing and gets up to 9/10. She is fine after getting out of bed.     Pertinent History  bone spurs bil heels, bil knee pain    Diagnostic tests  MRI left knee negative    Patient Stated Goals  get stronger, to not have to take medication    Currently in Pain?  No/denies         [facility] PT Assessment - 12/06/18 0001      Assessment   Medical Diagnosis  unspecified knee pain    Referring Provider (PT)  Tito Dine    Onset Date/Surgical Date  06/20/18    Prior Therapy  yes      Precautions   Precautions  None      Restrictions   Weight Bearing Restrictions  No      Balance Screen   Has the patient fallen in the past 6 months  No    Has the patient had a decrease in activity level because of a fear of falling?   No    Is the patient reluctant to leave their home because of a fear of falling?   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    Leisure  would like to be more active      Functional Tests   Functional tests  Squat;Lunges;Single leg stance;Single Leg Squat      Squat   Comments  [facility]  Lunges   Comments  weakness RLE      Single Leg Squat   Comments  Bil weakness R>L      Single Leg Stance   Comments  with reach to floor; bil weakness R>L      Posture/Postural Control   Posture Comments  mild ER R hip      ROM / Strength   AROM / PROM / Strength  AROM;Strength      Strength   Strength Assessment Site  Hip;Knee    Right/Left Hip  Right;Left    Right Hip Flexion  4+/5    Right Hip External Rotation   5/5    Right Hip Internal Rotation  4+/5    Left Hip Flexion  4-/5    Left Hip External Rotation  4+/5    Left Hip Internal Rotation  5/5    Right/Left Knee  Right;Left    Right Knee Flexion  4+/5    Right Knee Extension  4+/5    Left Knee Flexion  4+/5    Left Knee Extension  4+/5      Flexibility   Soft Tissue Assessment /Muscle Length  yes    Hamstrings  mild R    Quadriceps  left quad    Piriformis  bil tight      Palpation   Patella mobility  WFL                Objective measurements completed on examination: See above findings.                PT Short Term Goals - 12/06/18 1254      PT SHORT  TERM GOAL #1   Title  independent with initial HEP    Time  2    Period  Weeks    Status  New    Target Date  12/20/18        PT Long Term Goals - 12/06/18 1254      PT LONG TERM GOAL #1   Title  decrease pain 50% in bil knees with ADLs    Time  8    Period  Weeks    Status  New    Target Date  01/31/19      PT LONG TERM GOAL #2   Title  Pt able to perform Single leg squats with good form bilaterally    Time  8    Period  Weeks    Status  New      PT LONG TERM GOAL #3   Title  report no difficulty with stairs and walking    Time  8    Period  Weeks    Status  New      PT LONG TERM GOAL #4   Title  independent with advanced HEP or gym program    Time  8    Period  Weeks    Status  New             Plan - 12/06/18 1018    Clinical Impression Statement  Patient has long h/o of knee pain. This resolved in the past with PT but then her heels began hurting. She has bone spurs and podiatrist advised higher heels. This helped the heels but now knee pain is back. This began in June 2019. Pain is mainly after sitting for a while or after prolonged weight bearing and gets up to 9/10.  Patient has bil hip weakness Right>Left with higher level  activities and will benefit from strengthening here to decrease knee pain. She has full ROM and some flexibility deficits, but strength is her biggest deficit.    History and Personal Factors relevant to plan of care:  bil heel spurs    Clinical Presentation  Stable    Clinical Decision Making  Low    Rehab Potential  Excellent    PT Frequency  2x / week    PT Duration  8 weeks    PT Treatment/Interventions  ADLs/Self Care Home Management;Cryotherapy;Electrical Stimulation;Iontophoresis 4mg /ml Dexamethasone;Moist Heat;Therapeutic exercise;Neuromuscular re-education;Patient/family education;Manual techniques;Dry needling;Taping    PT Next Visit Plan  higher level LE strengthening (lunges, SL squats, squats); focus on hips and quads; also  flexibilty    PT Home Exercise Plan  HS, quad stretch with strap, seated figure 4, 1/2 kneel HF stretch       Patient will benefit from skilled therapeutic intervention in order to improve the following deficits and impairments:  Pain, Decreased strength, Impaired flexibility  Visit Diagnosis: Chronic pain of right knee - Plan: PT plan of care cert/re-cert  Chronic pain of left knee - Plan: PT plan of care cert/re-cert  Difficulty in walking, not elsewhere classified - Plan: PT plan of care cert/re-cert     Problem List There are no active problems to display for this patient.   Almyra Free Claudy Abdallah PT 12/06/2018, 1:00 PM  [address] [place], [place], [address] Phone: [phone]   Fax:  [phone]  Name: Cynthia Little MRN: [id number] Date of Birth: 14-Sep-1991

## 2018-12-09 ENCOUNTER — Encounter: Payer: Self-pay | Admitting: Physical Therapy

## 2018-12-09 ENCOUNTER — Ambulatory Visit: Payer: No Typology Code available for payment source | Admitting: Physical Therapy

## 2018-12-09 DIAGNOSIS — R262 Difficulty in walking, not elsewhere classified: Secondary | ICD-10-CM

## 2018-12-09 DIAGNOSIS — G8929 Other chronic pain: Secondary | ICD-10-CM

## 2018-12-09 DIAGNOSIS — M25561 Pain in right knee: Principal | ICD-10-CM

## 2018-12-09 DIAGNOSIS — M25562 Pain in left knee: Secondary | ICD-10-CM

## 2018-12-09 DIAGNOSIS — M25552 Pain in left hip: Secondary | ICD-10-CM

## 2018-12-09 NOTE — Therapy (Signed)
[address] [place], [place], [address] Phone: [phone]   Fax:  [phone]  Physical Therapy Treatment  Patient Details  Name: Cynthia Little MRN: [id number] Date of Birth: 1991-03-31 Referring Provider (PT): Tito Dine   Encounter Date: 12/09/2018  PT End of Session - 12/09/18 1008    Visit Number  2    Date for PT Re-Evaluation  01/31/19    PT Start Time  0928    PT Stop Time  1010    PT Time Calculation (min)  42 min    Activity Tolerance  Patient tolerated treatment well    Behavior During Therapy  [facility] for tasks assessed/performed       Past Medical History:  Diagnosis Date  . Anemia   . Asthma   . Bronchitis     Past Surgical History:  Procedure Laterality Date  . CESAREAN SECTION  08/11/2012   Procedure: CESAREAN SECTION;  Surgeon: Melina Schools, MD;  Location: [facility];  Service: Gynecology;  Laterality: N/A;  . NO PAST SURGERIES      There were no vitals filed for this visit.  Subjective Assessment - 12/09/18 0928    Subjective  Patient reports stiffness in the knees and the hips.    Currently in Pain?  No/denies                       [facility] Adult PT Treatment/Exercise - 12/09/18 0001      Exercises   Exercises  Knee/Hip      Knee/Hip Exercises: Stretches   Passive Hamstring Stretch  Both;3 reps;20 seconds    Quad Stretch  Both;3 reps;20 seconds    ITB Stretch  Both;3 reps;20 seconds    Piriformis Stretch  Both;3 reps;20 seconds    Gastroc Stretch  Both;3 reps;20 seconds      Knee/Hip Exercises: Aerobic   Stationary Bike  level 1 x 6 minute      Knee/Hip Exercises: Machines for Strengthening   Cybex Knee Extension  10# 3x10    Cybex Knee Flexion  25# 3x10    Cybex Leg Press  40# 3x10    Other Machine  hip abduction and extnesion 10# 2x10      Knee/Hip Exercises: Standing   Walking with Sports Cord  all directions               PT  Short Term Goals - 12/06/18 1254      PT SHORT TERM GOAL #1   Title  independent with initial HEP    Time  2    Period  Weeks    Status  New    Target Date  12/20/18        PT Long Term Goals - 12/06/18 1254      PT LONG TERM GOAL #1   Title  decrease pain 50% in bil knees with ADLs    Time  8    Period  Weeks    Status  New    Target Date  01/31/19      PT LONG TERM GOAL #2   Title  Pt able to perform Single leg squats with good form bilaterally    Time  8    Period  Weeks    Status  New      PT LONG TERM GOAL #3   Title  report no difficulty with stairs and walking    Time  8    Period  Weeks    Status  New      PT LONG TERM GOAL #4   Title  independent with advanced HEP or gym program    Time  8    Period  Weeks    Status  New            Plan - 12/09/18 1009    Clinical Impression Statement  Patient with fatigue in the hips with the hip exercises, she is very tight in the LE's.  Good effort and some shaking with the quad exercises    PT Next Visit Plan  higher level LE strengthening (lunges, SL squats, squats); focus on hips and quads; also flexibilty    Consulted and Agree with Plan of Care  Patient       Patient will benefit from skilled therapeutic intervention in order to improve the following deficits and impairments:     Visit Diagnosis: Chronic pain of right knee  Chronic pain of left knee  Difficulty in walking, not elsewhere classified  Pain in left hip     Problem List There are no active problems to display for this patient.   Sumner Boast., PT 12/09/2018, 10:10 AM  [address] [place], [place], [address] Phone: [phone]   Fax:  [phone]  Name: Inari Shin MRN: [id number] Date of Birth: 1991-02-12

## 2018-12-13 ENCOUNTER — Ambulatory Visit: Payer: No Typology Code available for payment source | Admitting: Physical Therapy

## 2018-12-13 ENCOUNTER — Encounter: Payer: Self-pay | Admitting: Physical Therapy

## 2018-12-13 DIAGNOSIS — G8929 Other chronic pain: Secondary | ICD-10-CM

## 2018-12-13 DIAGNOSIS — M25561 Pain in right knee: Principal | ICD-10-CM

## 2018-12-13 DIAGNOSIS — M25562 Pain in left knee: Secondary | ICD-10-CM

## 2018-12-13 DIAGNOSIS — R262 Difficulty in walking, not elsewhere classified: Secondary | ICD-10-CM

## 2018-12-13 NOTE — Therapy (Signed)
[address] [address] [place], [place], [address] Phone: [phone]   Fax:  [phone]  Physical Therapy Treatment  Patient Details  Name: Cynthia Little MRN: [id number] Date of Birth: Nov 24, 1991 Referring Provider (PT): Tito Dine   Encounter Date: 12/13/2018  PT End of Session - 12/13/18 1006    Visit Number  3    Number of Visits  15    Date for PT Re-Evaluation  01/31/19    Authorization Type  [place] - 15 [place]    PT Start Time  0840    PT Stop Time  0920    PT Time Calculation (min)  40 min    Activity Tolerance  Patient tolerated treatment well    Behavior During Therapy  [facility] for tasks assessed/performed       Past Medical History:  Diagnosis Date  . Anemia   . Asthma   . Bronchitis     Past Surgical History:  Procedure Laterality Date  . CESAREAN SECTION  08/11/2012   Procedure: CESAREAN SECTION;  Surgeon: Melina Schools, MD;  Location: [facility];  Service: Gynecology;  Laterality: N/A;  . NO PAST SURGERIES      There were no vitals filed for this visit.  Subjective Assessment - 12/13/18 0843    Subjective  Patient reports that she is doing okay, still a little stiff    Currently in Pain?  No/denies                       [facility] Adult PT Treatment/Exercise - 12/13/18 0001      Knee/Hip Exercises: Stretches   Passive Hamstring Stretch  Both;3 reps;20 seconds    Quad Stretch  Both;3 reps;20 seconds    ITB Stretch  Both;3 reps;20 seconds    Piriformis Stretch  Both;3 reps;20 seconds    Gastroc Stretch  Both;3 reps;20 seconds      Knee/Hip Exercises: Aerobic   Stationary Bike  level 1 x 4 minute    Elliptical  R=6 I=10 5 minutes      Knee/Hip Exercises: Machines for Strengthening   Cybex Knee Extension  10# 3x10    Cybex Knee Flexion  25# 3x10    Cybex Leg Press  40# 3x10    Other Machine  hip abduction and extnesion 10# 2x10      Knee/Hip Exercises:  Standing   Forward Step Up  2 sets;10 reps;Both;Step Height: 4"    Forward Step Up Limitations  cues for posture and use the mm and not the body    Walking with Sports Cord  all directions               PT Short Term Goals - 12/13/18 1007      PT SHORT TERM GOAL #1   Title  independent with initial HEP    Status  Achieved        PT Long Term Goals - 12/06/18 1254      PT LONG TERM GOAL #1   Title  decrease pain 50% in bil knees with ADLs    Time  8    Period  Weeks    Status  New    Target Date  01/31/19      PT LONG TERM GOAL #2   Title  Pt able to perform Single leg squats with good form bilaterally    Time  8    Period  Weeks  Status  New      PT LONG TERM GOAL #3   Title  report no difficulty with stairs and walking    Time  8    Period  Weeks    Status  New      PT LONG TERM GOAL #4   Title  independent with advanced HEP or gym program    Time  8    Period  Weeks    Status  New            Plan - 12/13/18 1007    Clinical Impression Statement  fatigue with the exercises but overall doing well, some issue with VMO activation, she is tight in the LE mms    PT Next Visit Plan  higher level LE strengthening (lunges, SL squats, squats); focus on hips and quads; also flexibilty    Consulted and Agree with Plan of Care  Patient       Patient will benefit from skilled therapeutic intervention in order to improve the following deficits and impairments:  Pain, Decreased strength, Impaired flexibility  Visit Diagnosis: Chronic pain of right knee  Chronic pain of left knee  Difficulty in walking, not elsewhere classified     Problem List There are no active problems to display for this patient.   Sumner Boast., PT 12/13/2018, 10:08 AM  [address] [place], [place], [address] Phone: [phone]   Fax:  [phone]  Name: Francia Verry MRN:  [id number] Date of Birth: 03/28/1991

## 2018-12-15 ENCOUNTER — Ambulatory Visit: Payer: No Typology Code available for payment source | Admitting: Physical Therapy

## 2018-12-15 DIAGNOSIS — R262 Difficulty in walking, not elsewhere classified: Secondary | ICD-10-CM

## 2018-12-15 DIAGNOSIS — M25561 Pain in right knee: Principal | ICD-10-CM

## 2018-12-15 DIAGNOSIS — M25562 Pain in left knee: Secondary | ICD-10-CM

## 2018-12-15 DIAGNOSIS — G8929 Other chronic pain: Secondary | ICD-10-CM

## 2018-12-15 DIAGNOSIS — M25552 Pain in left hip: Secondary | ICD-10-CM

## 2018-12-15 NOTE — Therapy (Signed)
[address] [place], [place], [address] Phone: [phone]   Fax:  [phone]  Physical Therapy Treatment  Patient Details  Name: Cynthia Little MRN: [id number] Date of Birth: 1991-12-14 Referring Provider (PT): Tito Dine   Encounter Date: 12/15/2018  PT End of Session - 12/15/18 0957    Visit Number  4    Number of Visits  15    Date for PT Re-Evaluation  01/31/19    Authorization Type  [place] - 15 visits approved    PT Start Time  0930    PT Stop Time  1010    PT Time Calculation (min)  40 min       Past Medical History:  Diagnosis Date  . Anemia   . Asthma   . Bronchitis     Past Surgical History:  Procedure Laterality Date  . CESAREAN SECTION  08/11/2012   Procedure: CESAREAN SECTION;  Surgeon: Melina Schools, MD;  Location: [facility];  Service: Gynecology;  Laterality: N/A;  . NO PAST SURGERIES      There were no vitals filed for this visit.  Subjective Assessment - 12/15/18 0934    Subjective  heels hurting alittle ( heel spurs) but overall pretty good    Currently in Pain?  No/denies                       [facility] Adult PT Treatment/Exercise - 12/15/18 0001      Knee/Hip Exercises: Stretches   Passive Hamstring Stretch  Both;3 reps;20 seconds    Quad Stretch  Both;3 reps;20 seconds    ITB Stretch  Both;3 reps;20 seconds    Piriformis Stretch  Both;3 reps;20 seconds    Gastroc Stretch  Both;3 reps;20 seconds      Knee/Hip Exercises: Aerobic   Elliptical  R=6 I=10 3 min fwd/3 back    Tread Mill  2 min each side for hip abd/add      Knee/Hip Exercises: Machines for Strengthening   Cybex Knee Extension  10# 3x10    Cybex Knee Flexion  25# 3x10      Knee/Hip Exercises: Standing   Forward Lunges  Both;1 set;10 reps    Side Lunges  Both;1 set;10 reps    Functional Squat  15 reps      Knee/Hip Exercises: Supine   Bridges  Strengthening;Both;15 reps   feet on  ball   Bridges Limitations  ball btwn knee bridge 15    Bridges with Clamshell  Strengthening;Both;15 reps   greeb tband   Straight Leg Raises  Both;10 reps   with abd green tband              PT Short Term Goals - 12/13/18 1007      PT SHORT TERM GOAL #1   Title  independent with initial HEP    Status  Achieved        PT Long Term Goals - 12/15/18 0956      PT LONG TERM GOAL #1   Title  decrease pain 50% in bil knees with ADLs    Status  Partially Met      PT LONG TERM GOAL #2   Title  Pt able to perform Single leg squats with good form bilaterally    Status  On-going      PT LONG TERM GOAL #3   Title  report no difficulty with stairs and walking  Status  On-going      PT LONG TERM GOAL #4   Title  independent with advanced HEP or gym program    Status  On-going            Plan - 12/15/18 0957    Clinical Impression Statement  progressing with goals. added ther ex and pt fatigued and soreness with muscles isolation. cuing for form with squats/lunges. LE tightness noted but pt verb improvement    PT Treatment/Interventions  ADLs/Self Care Home Management;Cryotherapy;Electrical Stimulation;Iontophoresis 63m/ml Dexamethasone;Moist Heat;Therapeutic exercise;Neuromuscular re-education;Patient/family education;Manual techniques;Dry needling;Taping    PT Next Visit Plan  higher level LE strengthening (lunges, SL squats, squats); focus on hips and quads; also flexibilty       Patient will benefit from skilled therapeutic intervention in order to improve the following deficits and impairments:  Pain, Decreased strength, Impaired flexibility  Visit Diagnosis: Chronic pain of right knee  Chronic pain of left knee  Difficulty in walking, not elsewhere classified  Pain in left hip     Problem List There are no active problems to display for this patient.   PAYSEUR,ANGIE PTA 12/15/2018, 9:58 AM  [address] [address]Phone: [phone]  Fax:  [phone] Name: Cynthia BickertMRN: [id number] of Birth: [birth year]

## 2018-12-23 ENCOUNTER — Ambulatory Visit (INDEPENDENT_AMBULATORY_CARE_PROVIDER_SITE_OTHER): Payer: No Typology Code available for payment source | Admitting: Podiatry

## 2018-12-23 DIAGNOSIS — M205X2 Other deformities of toe(s) (acquired), left foot: Secondary | ICD-10-CM

## 2018-12-23 DIAGNOSIS — M7752 Other enthesopathy of left foot: Secondary | ICD-10-CM

## 2018-12-23 DIAGNOSIS — M7732 Calcaneal spur, left foot: Secondary | ICD-10-CM | POA: Diagnosis not present

## 2018-12-23 DIAGNOSIS — M7662 Achilles tendinitis, left leg: Secondary | ICD-10-CM

## 2018-12-23 DIAGNOSIS — M2042 Other hammer toe(s) (acquired), left foot: Secondary | ICD-10-CM

## 2018-12-23 NOTE — Patient Instructions (Signed)
Pre-Operative Instructions  Congratulations, you have decided to take an important step towards improving your quality of life.  You can be assured that the doctors and staff at [facility] will be with you every step of the way.  Here are some important things you should know:  1. Plan to be at the surgery center/hospital at least 1 (one) hour prior to your scheduled time, unless otherwise directed by the surgical center/hospital staff.  You must have a responsible adult accompany you, remain during the surgery and drive you home.  Make sure you have directions to the surgical center/hospital to ensure you arrive on time. 2. If you are having surgery at [facility] or [facility], you will need a copy of your medical history and physical form from your family physician within one month prior to the date of surgery. We will give you a form for your primary physician to complete.  3. We make every effort to accommodate the date you request for surgery.  However, there are times where surgery dates or times have to be moved.  We will contact you as soon as possible if a change in schedule is required.   4. No aspirin/ibuprofen for one week before surgery.  If you are on aspirin, any non-steroidal anti-inflammatory medications (Mobic, Aleve, Ibuprofen) should not be taken seven (7) days prior to your surgery.  You make take Tylenol for pain prior to surgery.  5. Medications - If you are taking daily heart and blood pressure medications, seizure, reflux, allergy, asthma, anxiety, pain or diabetes medications, make sure you notify the surgery center/hospital before the day of surgery so they can tell you which medications you should take or avoid the day of surgery. 6. No food or drink after midnight the night before surgery unless directed otherwise by surgical center/hospital staff. 7. No alcoholic beverages 24-hours prior to surgery.  No smoking 24-hours prior or 24-hours after  surgery. 8. Wear loose pants or shorts. They should be loose enough to fit over bandages, boots, and casts. 9. Don't wear slip-on shoes. Sneakers are preferred. 10. Bring your boot with you to the surgery center/hospital.  Also bring crutches or a walker if your physician has prescribed it for you.  If you do not have this equipment, it will be provided for you after surgery. 11. If you have not been contacted by the surgery center/hospital by the day before your surgery, call to confirm the date and time of your surgery. 12. Leave-time from work may vary depending on the type of surgery you have.  Appropriate arrangements should be made prior to surgery with your employer. 13. Prescriptions will be provided immediately following surgery by your doctor.  Fill these as soon as possible after surgery and take the medication as directed. Pain medications will not be refilled on weekends and must be approved by the doctor. 14. Remove nail polish on the operative foot and avoid getting pedicures prior to surgery. 15. Wash the night before surgery.  The night before surgery wash the foot and leg well with water and the antibacterial soap provided. Be sure to pay special attention to beneath the toenails and in between the toes.  Wash for at least three (3) minutes. Rinse thoroughly with water and dry well with a towel.  Perform this wash unless told not to do so by your physician.  Enclosed: 1 Ice pack (please put in freezer the night before surgery)   1 Hibiclens skin cleaner     Pre-op instructions  If you have any questions regarding the instructions, please do not hesitate to call our office.  [place]: [address], [place], [place] [address] -- [phone]  [place]: [address] [place], [place] [address] -- [phone]  [place]: [address]  [place], [place] [address] -- [phone]  [place]: [address], [address], [place],  [address] -- [phone]  Website:  [email or web address] 

## 2019-01-01 NOTE — Progress Notes (Signed)
Subjective:  Patient ID: Cynthia Little, female    DOB: 1991/06/21,  MRN: [id number]  Chief Complaint  Patient presents with  . Tendonitis    left foot 6 wk tendonitis f/[place] ([place] 10[place]03[place]2018- 06/26/2019) 1 of unlimited visits    28 y.o. female presents with the above complaint.  States that the pain is doing a little better but overall feeling about the same also having pain in the left foot bunion area and on the fifth toe area where she has a painful corn.  Has tried physical therapy without improvement.  Review of Systems: Negative except as noted in the HPI. Denies N/V/F/Ch.  Past Medical History:  Diagnosis Date  . Anemia   . Asthma   . Bronchitis     Current Outpatient Medications:  .  fluconazole (DIFLUCAN) 150 MG tablet, TAKE 1 TABLET BY MOUTH AS ONE DOSE, Disp: , Rfl: 1 .  fluticasone (FLONASE) 50 MCG/ACT nasal spray, 2 sprays in each nostril daily x1 week then 1-2 sprays in each nostril daily. (use smallest dose possible for symptom control after week 1)., Disp: , Rfl:  .  Hypertonic Nasal Wash (SINUS RINSE) PACK, use as directed on packaging; Use distilled or sterile water, Disp: , Rfl:  .  levalbuterol (XOPENEX HFA) 45 MCG/ACT inhaler, 1-2 puffs inhaled every 4-6 hours as needed., Disp: , Rfl:  .  levofloxacin (LEVAQUIN) 500 MG tablet, Take by mouth., Disp: , Rfl:  .  meloxicam (MOBIC) 15 MG tablet, TAKE 1 TABLET BY MOUTH EVERY DAY *TAKE AFTER COMPLETION OF STEROID PACK, Disp: 30 tablet, Rfl: 0 .  methocarbamol (ROBAXIN) 500 MG tablet, Take 1 tablet (500 mg total) by mouth 2 (two) times daily., Disp: 20 tablet, Rfl: 0 .  methylPREDNISolone (MEDROL DOSEPAK) 4 MG TBPK tablet, 6 Day Taper Pack. Take as Directed., Disp: 21 tablet, Rfl: 0 .  naproxen (NAPROSYN) 500 MG tablet, Take 1 tablet (500 mg total) by mouth 2 (two) times daily., Disp: 30 tablet, Rfl: 0 .  predniSONE (DELTASONE) 1 MG tablet, Take by mouth., Disp: , Rfl:  .  Prenatal Vit-Fe Fumarate-FA (PRENATAL  MULTIVITAMIN) TABS, Take 1 tablet by mouth daily., Disp: , Rfl:   Social History   Tobacco Use  Smoking Status Never Smoker  Smokeless Tobacco Never Used    Allergies  Allergen Reactions  . Cephalexin Hives and Other (See Comments)    Hyperventilate    Objective:  There were no vitals filed for this visit. There is no height or weight on file to calculate BMI. Constitutional Well developed. Well nourished.  Vascular Dorsalis pedis pulses palpable bilaterally. Posterior tibial pulses palpable bilaterally. Capillary refill normal to all digits.  No cyanosis or clubbing noted. Pedal hair growth normal.  Neurologic Normal speech. Oriented to person, place, and time. Epicritic sensation to light touch grossly present bilaterally.  Dermatologic Nails well groomed and normal in appearance. No open wounds. No skin lesions.  Orthopedic: Normal joint ROM without pain or crepitus bilaterally. No visible deformities. Pain motion with the left first MPJ with limited range of motion, gastroc equinus deformity noted, pain to motion of the posterior Achilles tendon with prominent exostosis, pain with patient with a left fifth hammertoe   Radiographs: Prior x-rays reviewed Assessment:   1. Tendonitis, Achilles, left   2. Heel spur, left   3. Hallux limitus of left foot   4. Capsulitis of metatarsophalangeal (MTP) joint of left foot   5. Hammer toe of left foot    Plan:  Patient was evaluated and treated and all questions answered.  Left Haglund's deformity, hallux limitus, left fifth hammertoe -Discussed continued conservative or surgical therapy with patient patient would like to proceed with surgical mention -Patient has failed all conservative therapy and wishes to proceed with surgical intervention. All risks, benefits, and alternatives discussed with patient. No guarantees given. Consent reviewed and signed by patient. -Planned procedures: Left foot cheilectomy, left fifth  hammertoe correction, left-spur resection with detachment reattachment of Achilles tendon, gastrocnemius recession  Return for post op.

## 2019-01-06 ENCOUNTER — Telehealth: Payer: Self-pay | Admitting: *Deleted

## 2019-01-06 NOTE — Telephone Encounter (Signed)
I attempted to call the patient to inform her that we may have to postpone her surgery.  We have to get authorization from the [place].  I have to send a SAR secondary form to [place].

## 2019-01-20 ENCOUNTER — Ambulatory Visit: Payer: Non-veteran care | Admitting: Podiatry

## 2019-03-07 ENCOUNTER — Telehealth: Payer: Self-pay | Admitting: *Deleted

## 2019-03-07 NOTE — Telephone Encounter (Signed)
"  I'm calling to see if you have received my authorization from the [place] for my surgery.  The reason I'm calling is because they called me about a follow up appointment with you all.  I told them there's no need in me coming to see the doctor there when I've been told I need surgery.  We've already concluded that I need surgery.  I told them what we had been requesting the authorization from [place]."  We have not received the authorization from the [place] at this time.  I will ask Estill Bamberg, our referral coordinator, if she has received anything.  I will let you know if I find out anything.  I asked Estill Bamberg.  She said she submitted a request in February.  She said she would call [facility].

## 2019-03-14 ENCOUNTER — Telehealth: Payer: Self-pay | Admitting: *Deleted

## 2019-03-14 NOTE — Telephone Encounter (Signed)
"  I am calling you regarding my surgery.  I received something in the mail that says I was authorized to have surgery.  Did you receive this same information?"  Yes, I received it.  We can proceed with getting you scheduled.  However, your surgery has to be done at the hospital.  They require a completion of a history and physical form by your primary care physician.  The visit must be within a 30 day period of the surgery date.  Do you have a date you would like?  Dr. March Rummage does surgeries on Wednesdays.  "I'd like to do it after [facility] Day."  He can do it on May 27.  "That will be great.  I have been waiting so long for this, I'm excited."  I will mail you the history and physical form.  I'll get you scheduled for May 27.  "If they open up the schedule to do surgeries sooner, I'd like to move to a sooner date."  I'll keep that suggestion in mind.

## 2019-03-15 NOTE — Telephone Encounter (Signed)
I mailed Cynthia Little the history and physical form.  She's scheduled for surgery on 05/17/2019 at 12:30 pm at [facility].

## 2019-03-21 ENCOUNTER — Telehealth: Payer: Self-pay | Admitting: *Deleted

## 2019-03-21 NOTE — Telephone Encounter (Signed)
"  [facility] sent me a letter stating I had an appointment on April 3 with Dr. March Rummage.  Do I really need to see him?"  As far as I know, you do not need to see Dr. March Rummage again.  It will be four months since you signed the consent form by the time you have your surgery on May 17, 2019.  I will ask Dr. March Rummage if he needs to see you again.  I would hate to tell you not to come if it's a requirement by the [place].  "I understand.  I don't want to waste any of my leave time to come to an appointment if I don't need to.  I can use that time for something else."  I'll let you know what he says.

## 2019-03-23 NOTE — Telephone Encounter (Signed)
"  I never heard back about whether I needed to come back in to see Dr. March Rummage.  I need to know so I can put in a request to be off with my job."  Dr. March Rummage said you do not need to come back in to see him for that appointment.  I will cancel it.  "I do not need to come in?"  Yes, that is correct.  I canceled the appointment for 03/24/2019.

## 2019-03-24 ENCOUNTER — Ambulatory Visit: Payer: Non-veteran care | Admitting: Podiatry

## 2019-04-17 ENCOUNTER — Telehealth: Payer: Self-pay | Admitting: *Deleted

## 2019-04-17 NOTE — Telephone Encounter (Signed)
"  I have an upcoming surgery.  Can you fax the paperwork for my physical to my doctor?"  What's your doctor's name?  "It's Angelic Guiteau-Laurent."  What's her fax number?  "The fax number is [phone]."  I'll send her the forms.   I faxed the history and physical note to Dr. Anastasio Champion.

## 2019-05-04 ENCOUNTER — Telehealth: Payer: Self-pay | Admitting: *Deleted

## 2019-05-04 DIAGNOSIS — M779 Enthesopathy, unspecified: Secondary | ICD-10-CM

## 2019-05-04 DIAGNOSIS — M7662 Achilles tendinitis, left leg: Secondary | ICD-10-CM

## 2019-05-04 DIAGNOSIS — M2040 Other hammer toe(s) (acquired), unspecified foot: Secondary | ICD-10-CM

## 2019-05-04 NOTE — Telephone Encounter (Signed)
"  I'm scheduled for surgery on May 17, 2019.  How do I go about get the knee scooter and anything else that I may need?"  You have to get it through the [place].  All authorizations for durable medical equipment has to come from the [place].  I am calling you back.  I will send a request to the [place] requesting the knee scooter.  Do you have an air fracture walker?  "I don't have anything.  Can you see if I can get a chair that I can use in the shower?"  I'll will put it on the request.  We'll see if they will authorize it.  "Thank you so much.  I'm sorry to be a bother.  I know I have called you a million times."  You are no bother.  You are welcome.  I sent a message to Jolee Ewing, [facility], requesting their assistance in helping the patient get the knee scooter and the shower chair.  I also faxed a request to [facility] requesting all the durable medical equipment that will be needed post-operatively for the patient.

## 2019-05-09 ENCOUNTER — Other Ambulatory Visit: Payer: Self-pay

## 2019-05-09 ENCOUNTER — Encounter (HOSPITAL_BASED_OUTPATIENT_CLINIC_OR_DEPARTMENT_OTHER): Payer: Self-pay | Admitting: *Deleted

## 2019-05-11 ENCOUNTER — Telehealth: Payer: Self-pay | Admitting: *Deleted

## 2019-05-11 NOTE — Telephone Encounter (Signed)
Ronalee Belts from the [place] called and inquired about Veyda's surgery that's scheduled for May 17, 2019.  He had two conflicting referrals.  He stated they did not have all of [place]'s procedures listed.  He discovered that one of the referrals was from [facility].  He made all corrections and stated patient is authorized to have the surgery.  I asked him about the request for durable medical equipment that I had submitted.  He asked me to send it to [phone] and to put it to his attention.  He said he would make sure it was placed in the appropriate hands.  I faxed him the request.  Ronalee Belts sent an updated authorization form for [facility].  Her previous authorization was via [facility] which he informed me that they are not affiliated with them anymore.  He said they are now affiliated with [employer].  The new authorization number for her surgery that's scheduled for 05/17/2019 is [id number].

## 2019-05-12 ENCOUNTER — Telehealth: Payer: Self-pay | Admitting: *Deleted

## 2019-05-12 NOTE — Telephone Encounter (Signed)
"  I believe we have some fair coordination we need to speak about, about Cynthia Little.  I will try to call you tomorrow or you can try to call me back."

## 2019-05-16 ENCOUNTER — Other Ambulatory Visit: Payer: Self-pay

## 2019-05-16 ENCOUNTER — Other Ambulatory Visit (HOSPITAL_COMMUNITY)
Admission: RE | Admit: 2019-05-16 | Discharge: 2019-05-16 | Disposition: A | Discharge status: Home / Self Care | Payer: No Typology Code available for payment source | Source: Ambulatory Visit | Attending: Podiatry | Admitting: Podiatry

## 2019-05-16 ENCOUNTER — Encounter (HOSPITAL_BASED_OUTPATIENT_CLINIC_OR_DEPARTMENT_OTHER)
Admission: RE | Admit: 2019-05-16 | Discharge: 2019-05-16 | Disposition: A | Discharge status: Home / Self Care | Payer: No Typology Code available for payment source | Source: Ambulatory Visit | Attending: Podiatry | Admitting: Podiatry

## 2019-05-16 DIAGNOSIS — Z01812 Encounter for preprocedural laboratory examination: Secondary | ICD-10-CM | POA: Insufficient documentation

## 2019-05-16 DIAGNOSIS — Z1159 Encounter for screening for other viral diseases: Secondary | ICD-10-CM | POA: Diagnosis not present

## 2019-05-16 LAB — POCT PREGNANCY, URINE: Preg Test, Ur: NEGATIVE

## 2019-05-16 LAB — SARS CORONAVIRUS 2 BY RT PCR (HOSPITAL ORDER, PERFORMED IN ~~LOC~~ HOSPITAL LAB): SARS Coronavirus 2: NEGATIVE

## 2019-05-16 NOTE — Progress Notes (Signed)
Pt arrived for urine preg test (negative) and to pick up Ensure Pre-Surgery drink. Reviewed instruction (verbal and written) and pt verbalized understanding.

## 2019-05-17 ENCOUNTER — Encounter (HOSPITAL_BASED_OUTPATIENT_CLINIC_OR_DEPARTMENT_OTHER)
Admission: RE | Disposition: A | Discharge status: Home / Self Care | Payer: Self-pay | Source: Home / Self Care | Attending: Podiatry

## 2019-05-17 ENCOUNTER — Encounter (HOSPITAL_BASED_OUTPATIENT_CLINIC_OR_DEPARTMENT_OTHER): Payer: Self-pay | Admitting: *Deleted

## 2019-05-17 ENCOUNTER — Ambulatory Visit (HOSPITAL_BASED_OUTPATIENT_CLINIC_OR_DEPARTMENT_OTHER): Payer: No Typology Code available for payment source | Admitting: Certified Registered"

## 2019-05-17 ENCOUNTER — Ambulatory Visit (HOSPITAL_BASED_OUTPATIENT_CLINIC_OR_DEPARTMENT_OTHER)
Admission: RE | Admit: 2019-05-17 | Discharge: 2019-05-17 | Disposition: A | Discharge status: Home / Self Care | Payer: No Typology Code available for payment source | Attending: Podiatry | Admitting: Podiatry

## 2019-05-17 ENCOUNTER — Other Ambulatory Visit: Payer: Self-pay

## 2019-05-17 ENCOUNTER — Encounter: Payer: Self-pay | Admitting: Podiatry

## 2019-05-17 DIAGNOSIS — M2042 Other hammer toe(s) (acquired), left foot: Secondary | ICD-10-CM | POA: Diagnosis not present

## 2019-05-17 DIAGNOSIS — M7732 Calcaneal spur, left foot: Secondary | ICD-10-CM | POA: Diagnosis not present

## 2019-05-17 DIAGNOSIS — Z79899 Other long term (current) drug therapy: Secondary | ICD-10-CM | POA: Insufficient documentation

## 2019-05-17 DIAGNOSIS — J45909 Unspecified asthma, uncomplicated: Secondary | ICD-10-CM | POA: Insufficient documentation

## 2019-05-17 DIAGNOSIS — M216X2 Other acquired deformities of left foot: Secondary | ICD-10-CM | POA: Diagnosis not present

## 2019-05-17 DIAGNOSIS — M2022 Hallux rigidus, left foot: Secondary | ICD-10-CM | POA: Diagnosis not present

## 2019-05-17 DIAGNOSIS — M205X2 Other deformities of toe(s) (acquired), left foot: Secondary | ICD-10-CM | POA: Diagnosis present

## 2019-05-17 HISTORY — PX: HAMMER TOE SURGERY: SHX385

## 2019-05-17 HISTORY — PX: CALCANEAL OSTEOTOMY: SHX1281

## 2019-05-17 HISTORY — PX: MINOR CLOSED MANIPULATION: SHX6470

## 2019-05-17 HISTORY — PX: GASTROC RECESSION EXTREMITY: SHX6262

## 2019-05-17 HISTORY — PX: CHEILECTOMY: SHX1336

## 2019-05-17 SURGERY — CHEILECTOMY
Anesthesia: General | Site: Thigh | Laterality: Left

## 2019-05-17 MED ORDER — ROCURONIUM BROMIDE 10 MG/ML (PF) SYRINGE
PREFILLED_SYRINGE | INTRAVENOUS | Status: AC
  Filled 2019-05-17: qty 10

## 2019-05-17 MED ORDER — SUGAMMADEX SODIUM 500 MG/5ML IV SOLN
INTRAVENOUS | Status: AC
  Filled 2019-05-17: qty 5

## 2019-05-17 MED ORDER — ONDANSETRON HCL 4 MG/2ML IJ SOLN
INTRAMUSCULAR | Status: DC | PRN
  Administered 2019-05-17 (×2): 4 mg via INTRAVENOUS

## 2019-05-17 MED ORDER — SCOPOLAMINE 1 MG/3DAYS TD PT72: 1.0000 | MEDICATED_PATCH | Freq: Once | TRANSDERMAL | Status: DC | PRN

## 2019-05-17 MED ORDER — CLINDAMYCIN PHOSPHATE 900 MG/50ML IV SOLN
INTRAVENOUS | Status: AC
  Filled 2019-05-17: qty 50

## 2019-05-17 MED ORDER — ENOXAPARIN SODIUM 40 MG/0.4ML ~~LOC~~ SOLN: 40.0000 mg | Freq: Two times a day (BID) | SUBCUTANEOUS | 0 refills | Status: DC

## 2019-05-17 MED ORDER — PROPOFOL 500 MG/50ML IV EMUL
INTRAVENOUS | Status: DC | PRN
  Administered 2019-05-17: 25 ug/kg/min via INTRAVENOUS

## 2019-05-17 MED ORDER — 0.9 % SODIUM CHLORIDE (POUR BTL) OPTIME
TOPICAL | Status: DC | PRN
  Administered 2019-05-17: 1000 mL

## 2019-05-17 MED ORDER — ONDANSETRON HCL 4 MG/2ML IJ SOLN
INTRAMUSCULAR | Status: AC
  Filled 2019-05-17: qty 4

## 2019-05-17 MED ORDER — METOCLOPRAMIDE HCL 5 MG/ML IJ SOLN: 10.0000 mg | Freq: Once | INTRAMUSCULAR | Status: DC | PRN

## 2019-05-17 MED ORDER — PROPOFOL 10 MG/ML IV BOLUS
INTRAVENOUS | Status: DC | PRN
  Administered 2019-05-17: 200 mg via INTRAVENOUS

## 2019-05-17 MED ORDER — CLINDAMYCIN PHOSPHATE 900 MG/50ML IV SOLN
900.0000 mg | INTRAVENOUS | Status: AC
  Administered 2019-05-17: 900 mg via INTRAVENOUS

## 2019-05-17 MED ORDER — ONDANSETRON HCL 4 MG PO TABS: 4.0000 mg | ORAL_TABLET | Freq: Three times a day (TID) | ORAL | 0 refills | Status: DC | PRN

## 2019-05-17 MED ORDER — LACTATED RINGERS IV SOLN
INTRAVENOUS | Status: DC
  Administered 2019-05-17 (×2): via INTRAVENOUS

## 2019-05-17 MED ORDER — FENTANYL CITRATE (PF) 100 MCG/2ML IJ SOLN
50.0000 ug | INTRAMUSCULAR | Status: DC | PRN
  Administered 2019-05-17: 11:00:00 100 ug via INTRAVENOUS

## 2019-05-17 MED ORDER — FENTANYL CITRATE (PF) 100 MCG/2ML IJ SOLN
INTRAMUSCULAR | Status: AC
  Filled 2019-05-17: qty 2

## 2019-05-17 MED ORDER — DEXAMETHASONE SODIUM PHOSPHATE 10 MG/ML IJ SOLN
INTRAMUSCULAR | Status: DC | PRN
  Administered 2019-05-17: 10 mg via INTRAVENOUS

## 2019-05-17 MED ORDER — LIDOCAINE 2% (20 MG/ML) 5 ML SYRINGE
INTRAMUSCULAR | Status: DC | PRN
  Administered 2019-05-17: 100 mg via INTRAVENOUS

## 2019-05-17 MED ORDER — SCOPOLAMINE 1 MG/3DAYS TD PT72
MEDICATED_PATCH | TRANSDERMAL | Status: AC
  Filled 2019-05-17: qty 1

## 2019-05-17 MED ORDER — DEXAMETHASONE SODIUM PHOSPHATE 10 MG/ML IJ SOLN
INTRAMUSCULAR | Status: AC
  Filled 2019-05-17: qty 1

## 2019-05-17 MED ORDER — KETOROLAC TROMETHAMINE 30 MG/ML IJ SOLN
INTRAMUSCULAR | Status: DC | PRN
  Administered 2019-05-17: 30 mg via INTRAVENOUS

## 2019-05-17 MED ORDER — MEPERIDINE HCL 25 MG/ML IJ SOLN: 6.2500 mg | INTRAMUSCULAR | Status: DC | PRN

## 2019-05-17 MED ORDER — LACTATED RINGERS IV SOLN: INTRAVENOUS | Status: DC

## 2019-05-17 MED ORDER — OXYCODONE-ACETAMINOPHEN 10-325 MG PO TABS: 1.0000 | ORAL_TABLET | ORAL | 0 refills | Status: DC | PRN

## 2019-05-17 MED ORDER — ROCURONIUM BROMIDE 100 MG/10ML IV SOLN
INTRAVENOUS | Status: DC | PRN
  Administered 2019-05-17: 20 mg via INTRAVENOUS
  Administered 2019-05-17: 80 mg via INTRAVENOUS

## 2019-05-17 MED ORDER — MIDAZOLAM HCL 2 MG/2ML IJ SOLN
INTRAMUSCULAR | Status: AC
  Filled 2019-05-17: qty 2

## 2019-05-17 MED ORDER — CLINDAMYCIN HCL 150 MG PO CAPS: 150.0000 mg | ORAL_CAPSULE | Freq: Two times a day (BID) | ORAL | 0 refills | Status: DC

## 2019-05-17 MED ORDER — SCOPOLAMINE 1 MG/3DAYS TD PT72
MEDICATED_PATCH | TRANSDERMAL | Status: DC | PRN
  Administered 2019-05-17: 1 via TRANSDERMAL

## 2019-05-17 MED ORDER — ROPIVACAINE HCL 5 MG/ML IJ SOLN
INTRAMUSCULAR | Status: DC | PRN
  Administered 2019-05-17: 30 mL via PERINEURAL

## 2019-05-17 MED ORDER — ALBUTEROL SULFATE HFA 108 (90 BASE) MCG/ACT IN AERS
INHALATION_SPRAY | RESPIRATORY_TRACT | Status: DC | PRN
  Administered 2019-05-17: 2 via RESPIRATORY_TRACT

## 2019-05-17 MED ORDER — MIDAZOLAM HCL 2 MG/2ML IJ SOLN
1.0000 mg | INTRAMUSCULAR | Status: DC | PRN
  Administered 2019-05-17: 2 mg via INTRAVENOUS

## 2019-05-17 MED ORDER — FENTANYL CITRATE (PF) 100 MCG/2ML IJ SOLN: 25.0000 ug | INTRAMUSCULAR | Status: DC | PRN

## 2019-05-17 MED ORDER — PROPOFOL 10 MG/ML IV BOLUS
INTRAVENOUS | Status: AC
  Filled 2019-05-17: qty 20

## 2019-05-17 MED ORDER — LIDOCAINE 2% (20 MG/ML) 5 ML SYRINGE
INTRAMUSCULAR | Status: AC
  Filled 2019-05-17: qty 5

## 2019-05-17 MED ORDER — SUGAMMADEX SODIUM 200 MG/2ML IV SOLN
INTRAVENOUS | Status: DC | PRN
  Administered 2019-05-17: 200 mg via INTRAVENOUS

## 2019-05-17 SURGICAL SUPPLY — 85 items
BANDAGE ACE 3X5.8 VEL STRL LF (GAUZE/BANDAGES/DRESSINGS) ×3 IMPLANT
BANDAGE ACE 4X5 VEL STRL LF (GAUZE/BANDAGES/DRESSINGS) IMPLANT
BANDAGE ACE 6X5 VEL STRL LF (GAUZE/BANDAGES/DRESSINGS) ×3 IMPLANT
BANDAGE ESMARK 6X9 LF (GAUZE/BANDAGES/DRESSINGS) ×2 IMPLANT
BLADE AVERAGE 25X9 (BLADE) ×1 IMPLANT
BLADE HEX COATED 2.75 (ELECTRODE) ×3 IMPLANT
BLADE MICRO SAGITTAL (BLADE) ×3 IMPLANT
BLADE PRESCISION 7.0X.51X18.5 (BLADE) ×1 IMPLANT
BLADE SAW SGTL 14.8X61X.97 HD (BLADE) ×3 IMPLANT
BLADE SURG 15 STRL LF DISP TIS (BLADE) ×6 IMPLANT
BLADE SURG 15 STRL SS (BLADE) ×3
BNDG COHESIVE 6X5 TAN STRL LF (GAUZE/BANDAGES/DRESSINGS) ×1 IMPLANT
BNDG ESMARK 4X9 LF (GAUZE/BANDAGES/DRESSINGS) ×3 IMPLANT
BNDG ESMARK 6X9 LF (GAUZE/BANDAGES/DRESSINGS) ×3
BNDG GAUZE ELAST 4 BULKY (GAUZE/BANDAGES/DRESSINGS) ×3 IMPLANT
BOOT STEPPER DURA LG (SOFTGOODS) ×1 IMPLANT
CHLORAPREP W/TINT 26 (MISCELLANEOUS) ×4 IMPLANT
COVER BACK TABLE REUSABLE LG (DRAPES) ×3 IMPLANT
COVER MAYO STAND REUSABLE (DRAPES) IMPLANT
COVER WAND RF STERILE (DRAPES) IMPLANT
CUFF TOURN SGL QUICK 18X4 (TOURNIQUET CUFF) IMPLANT
CUFF TOURN SGL QUICK 24 (TOURNIQUET CUFF) ×1
CUFF TOURN SGL QUICK 34 (TOURNIQUET CUFF) ×1
CUFF TRNQT CYL 24X4X16.5-23 (TOURNIQUET CUFF) IMPLANT
CUFF TRNQT CYL 34X4.125X (TOURNIQUET CUFF) IMPLANT
DRAPE C-ARMOR (DRAPES) ×3 IMPLANT
DRAPE EXTREMITY T 121X128X90 (DISPOSABLE) ×3 IMPLANT
DRAPE OEC MINIVIEW 54X84 (DRAPES) ×1 IMPLANT
DRAPE U-SHAPE 47X51 STRL (DRAPES) ×3 IMPLANT
DRSG PAD ABDOMINAL 8X10 ST (GAUZE/BANDAGES/DRESSINGS) IMPLANT
ELECT REM PT RETURN 9FT ADLT (ELECTROSURGICAL) ×3
ELECTRODE REM PT RTRN 9FT ADLT (ELECTROSURGICAL) ×2 IMPLANT
GAUZE 4X4 16PLY RFD (DISPOSABLE) IMPLANT
GAUZE SPONGE 4X4 12PLY STRL (GAUZE/BANDAGES/DRESSINGS) ×3 IMPLANT
GAUZE XEROFORM 1X8 LF (GAUZE/BANDAGES/DRESSINGS) ×5 IMPLANT
GLOVE BIO SURGEON STRL SZ7 (GLOVE) ×2 IMPLANT
GLOVE BIO SURGEON STRL SZ7.5 (GLOVE) ×5 IMPLANT
GLOVE BIOGEL PI IND STRL 6.5 (GLOVE) IMPLANT
GLOVE BIOGEL PI IND STRL 7.0 (GLOVE) IMPLANT
GLOVE BIOGEL PI IND STRL 7.5 (GLOVE) IMPLANT
GLOVE BIOGEL PI IND STRL 8 (GLOVE) ×2 IMPLANT
GLOVE BIOGEL PI INDICATOR 6.5 (GLOVE) ×2
GLOVE BIOGEL PI INDICATOR 7.0 (GLOVE) ×2
GLOVE BIOGEL PI INDICATOR 7.5 (GLOVE) ×1
GLOVE BIOGEL PI INDICATOR 8 (GLOVE) ×2
GLOVE SURG SS PI 6.5 STRL IVOR (GLOVE) ×1 IMPLANT
GOWN STRL REUS W/ TWL LRG LVL3 (GOWN DISPOSABLE) ×2 IMPLANT
GOWN STRL REUS W/TWL LRG LVL3 (GOWN DISPOSABLE) ×2
GOWN STRL REUS W/TWL XL LVL3 (GOWN DISPOSABLE) ×3 IMPLANT
HEMOSTAT SURGICEL 2X14 (HEMOSTASIS) ×1 IMPLANT
IMPL SYS BIOCOMP ACH SPEED (Anchor) IMPLANT
IMPLANT SYS BIOCOMP ACH SPEED (Anchor) ×3 IMPLANT
NDL HYPO 25X1 1.5 SAFETY (NEEDLE) ×2 IMPLANT
NEEDLE HYPO 25X1 1.5 SAFETY (NEEDLE) ×3 IMPLANT
NS IRRIG 1000ML POUR BTL (IV SOLUTION) ×1 IMPLANT
PACK BASIN DAY SURGERY FS (CUSTOM PROCEDURE TRAY) ×3 IMPLANT
PAD CAST 4YDX4 CTTN HI CHSV (CAST SUPPLIES) ×2 IMPLANT
PADDING CAST ABS 4INX4YD NS (CAST SUPPLIES) ×1
PADDING CAST ABS COTTON 4X4 ST (CAST SUPPLIES) ×2 IMPLANT
PADDING CAST COTTON 4X4 STRL (CAST SUPPLIES) ×1
PENCIL BUTTON HOLSTER BLD 10FT (ELECTRODE) ×3 IMPLANT
SHEET MEDIUM DRAPE 40X70 STRL (DRAPES) ×3 IMPLANT
SLEEVE SCD COMPRESS KNEE MED (MISCELLANEOUS) ×3 IMPLANT
SPLINT FIBERGLASS 4X30 (CAST SUPPLIES) ×3 IMPLANT
SPONGE LAP 4X18 RFD (DISPOSABLE) ×3 IMPLANT
STAPLER VISISTAT 35W (STAPLE) ×1 IMPLANT
STOCKINETTE 6  STRL (DRAPES) ×1
STOCKINETTE 6 STRL (DRAPES) ×2 IMPLANT
STOCKINETTE IMPERVIOUS LG (DRAPES) ×1 IMPLANT
STOCKINETTE SYNTHETIC 4 NONSTR (MISCELLANEOUS) ×3 IMPLANT
SUCTION FRAZIER HANDLE 10FR (MISCELLANEOUS) ×1
SUCTION TUBE FRAZIER 10FR DISP (MISCELLANEOUS) ×2 IMPLANT
SUT ETHILON 4 0 PS 2 18 (SUTURE) ×2 IMPLANT
SUT MERSILENE 4 0 P 3 (SUTURE) IMPLANT
SUT MNCRL AB 3-0 PS2 18 (SUTURE) ×4 IMPLANT
SUT MNCRL AB 4-0 PS2 18 (SUTURE) ×4 IMPLANT
SUT VIC AB 2-0 SH 27 (SUTURE) ×2
SUT VIC AB 2-0 SH 27XBRD (SUTURE) ×2 IMPLANT
SUT VIC AB 3-0 FS2 27 (SUTURE) ×3 IMPLANT
SUT VICRYL 4-0 PS2 18IN ABS (SUTURE) ×3 IMPLANT
SYR BULB 3OZ (MISCELLANEOUS) ×3 IMPLANT
SYR CONTROL 10ML LL (SYRINGE) ×3 IMPLANT
TOWEL GREEN STERILE FF (TOWEL DISPOSABLE) ×3 IMPLANT
UNDERPAD 30X30 (UNDERPADS AND DIAPERS) ×3 IMPLANT
YANKAUER SUCT BULB TIP NO VENT (SUCTIONS) ×3 IMPLANT

## 2019-05-17 NOTE — Anesthesia Procedure Notes (Signed)
Procedure Name: Intubation Date/Time: 05/17/2019 11:08 AM Performed by: Gwyndolyn Saxon, CRNA Pre-anesthesia Checklist: Patient identified, Emergency Drugs available, Suction available and Patient being monitored Patient Re-evaluated:Patient Re-evaluated prior to induction Oxygen Delivery Method: Circle system utilized Preoxygenation: Pre-oxygenation with 100% oxygen Induction Type: IV induction Ventilation: Mask ventilation without difficulty Laryngoscope Size: Miller and 2 Grade View: Grade II Tube type: Oral Tube size: 7.0 mm Number of attempts: 1 Airway Equipment and Method: Patient positioned with wedge pillow and Stylet Placement Confirmation: ETT inserted through vocal cords under direct vision,  positive ETCO2 and breath sounds checked- equal and bilateral Secured at: 22 cm Tube secured with: Tape Dental Injury: Teeth and Oropharynx as per pre-operative assessment

## 2019-05-17 NOTE — Progress Notes (Signed)
Assisted Dr. Marcell Barlow with left, ultrasound guided, popliteal block. Side rails up, monitors on throughout procedure. See vital signs in flow sheet. Tolerated Procedure well.

## 2019-05-17 NOTE — Transfer of Care (Signed)
Immediate Anesthesia Transfer of Care Note  Patient: Cynthia Little  Procedure(s) Performed: CHEILECTOMY LEFT FOOT (Left Foot) GASTROCNEMIUS RECESS (Left Thigh) CALCANEAL OSTECTOMY (Left Foot) HAMMER TOE CORRECTION FIFTH (Left Foot) MINOR CLOSED MANIPULATION (Left Foot)  Patient Location: PACU  Anesthesia Type:General and Regional  Level of Consciousness: drowsy and patient cooperative  Airway & Oxygen Therapy: Patient Spontanous Breathing and Patient connected to nasal cannula oxygen  Post-op Assessment: Report given to RN and Post -op Vital signs reviewed and stable  Post vital signs: Reviewed and stable  Last Vitals:  Vitals Value Taken Time  BP 137/79 05/17/2019  2:31 PM  Temp    Pulse 111 05/17/2019  2:35 PM  Resp 28 05/17/2019  2:35 PM  SpO2 100 % 05/17/2019  2:35 PM  Vitals shown include unvalidated device data.  Last Pain:  Vitals:   05/17/19 0952  TempSrc: Oral  PainSc: 0-No pain         Complications: No apparent anesthesia complications

## 2019-05-17 NOTE — Anesthesia Preprocedure Evaluation (Signed)
Anesthesia Evaluation  Patient identified by MRN, date of birth, ID band Patient awake    Reviewed: Allergy & Precautions, NPO status , Patient's Chart, lab work & pertinent test results  Airway Mallampati: II  TM Distance: >3 FB Neck ROM: Full    Dental no notable dental hx.    Pulmonary neg pulmonary ROS,    Pulmonary exam normal breath sounds clear to auscultation       Cardiovascular negative cardio ROS Normal cardiovascular exam Rhythm:Regular Rate:Normal     Neuro/Psych negative neurological ROS  negative psych ROS   GI/Hepatic negative GI ROS, Neg liver ROS,   Endo/Other  negative endocrine ROS  Renal/GU negative Renal ROS  negative genitourinary   Musculoskeletal negative musculoskeletal ROS (+)   Abdominal   Peds negative pediatric ROS (+)  Hematology negative hematology ROS (+)   Anesthesia Other Findings   Reproductive/Obstetrics negative OB ROS                             Anesthesia Physical Anesthesia Plan  ASA: II  Anesthesia Plan: General   Post-op Pain Management:    Induction: Intravenous  PONV Risk Score and Plan: 3 and Ondansetron, Dexamethasone and Treatment may vary due to age or medical condition  Airway Management Planned: Oral ETT  Additional Equipment:   Intra-op Plan:   Post-operative Plan: Extubation in OR  Informed Consent: I have reviewed the patients History and Physical, chart, labs and discussed the procedure including the risks, benefits and alternatives for the proposed anesthesia with the patient or authorized representative who has indicated his/her understanding and acceptance.     Dental advisory given  Plan Discussed with: CRNA  Anesthesia Plan Comments:         Anesthesia Quick Evaluation

## 2019-05-17 NOTE — Anesthesia Procedure Notes (Signed)
Anesthesia Regional Block: Popliteal block   Pre-Anesthetic Checklist: ,, timeout performed, Correct Patient, Correct Site, Correct Laterality, Correct Procedure, Correct Position, site marked, Risks and benefits discussed,  Surgical consent,  Pre-op evaluation,  At surgeon's request and post-op pain management  Laterality: Left and Lower  Prep: Maximum Sterile Barrier Precautions used, chloraprep       Needles:  Injection technique: Single-shot  Needle Type: Echogenic Stimulator Needle     Needle Length: 10cm      Additional Needles:   Procedures:,,,, ultrasound used (permanent image in chart),,,,  Narrative:  Start time: 05/17/2019 10:37 AM End time: 05/17/2019 10:46 AM Injection made incrementally with aspirations every 5 mL.  Performed by: Personally  Anesthesiologist: Montez Hageman, MD  Additional Notes: Risks, benefits and alternative to block explained extensively.  Patient tolerated procedure well, without complications.

## 2019-05-17 NOTE — Discharge Instructions (Signed)

## 2019-05-17 NOTE — Anesthesia Postprocedure Evaluation (Signed)
Anesthesia Post Note  Patient: Keymiah Lyles  Procedure(s) Performed: CHEILECTOMY LEFT FOOT (Left Foot) GASTROCNEMIUS RECESS (Left Thigh) CALCANEAL OSTECTOMY (Left Foot) HAMMER TOE CORRECTION FIFTH (Left Foot) MINOR CLOSED MANIPULATION (Left Foot)     Patient location during evaluation: PACU Anesthesia Type: General and Regional Level of consciousness: awake and alert Pain management: pain level controlled Vital Signs Assessment: post-procedure vital signs reviewed and stable Respiratory status: spontaneous breathing, nonlabored ventilation and respiratory function stable Cardiovascular status: blood pressure returned to baseline and stable Postop Assessment: no apparent nausea or vomiting Anesthetic complications: no    Last Vitals:  Vitals:   05/17/19 1445 05/17/19 1500  BP: 125/90 127/72  Pulse: (!) 108 (!) 103  Resp: (!) 30 (!) 30  Temp:    SpO2: 100% 98%    Last Pain:  Vitals:   05/17/19 1431  TempSrc:   PainSc: 0-No pain                 Alanya Vukelich,W. EDMOND

## 2019-05-17 NOTE — H&P (Signed)
  Subjective:  Patient ID: Cynthia Little, female    DOB: January 03, 1991,  MRN: [id number]  No chief complaint on file.   28 y.o. female presents for elective surgery today. No update to medical history since last eval. Ready for surgery. All questions answered. Consent reviewed and signed.  Review of Systems: Negative except as noted in the HPI. Denies N/V/F/Ch.  Past Medical History:  Diagnosis Date  . Anemia   . Asthma   . Bronchitis     Current Facility-Administered Medications:  .  clindamycin (CLEOCIN) IVPB 900 mg, 900 mg, Intravenous, On Call to OR, Evelina Bucy, DPM .  fentaNYL (SUBLIMAZE) injection 50-100 mcg, 50-100 mcg, Intravenous, PRN, Janeece Riggers, MD .  lactated ringers infusion, , Intravenous, Continuous, Janeece Riggers, MD, Last Rate: 10 mL/hr at 05/17/19 0956 .  midazolam (VERSED) injection 1-2 mg, 1-2 mg, Intravenous, PRN, Janeece Riggers, MD .  scopolamine (TRANSDERM-SCOP) 1 MG/3DAYS 1.5 mg, 1 patch, Transdermal, Once PRN, Janeece Riggers, MD  Social History   Tobacco Use  Smoking Status Never Smoker  Smokeless Tobacco Never Used    Allergies  Allergen Reactions  . Cephalexin Hives and Other (See Comments)    Hyperventilate    Objective:   Vitals:   05/17/19 0952  BP: 113/74  Pulse: 84  Temp: 98.5 F (36.9 C)  SpO2: 100%   Body mass index is 43.14 kg/m. Constitutional Well developed. Well nourished.  Vascular Dorsalis pedis pulses palpable bilaterally. Posterior tibial pulses palpable bilaterally. Capillary refill normal to all digits.  No cyanosis or clubbing noted. Pedal hair growth normal.  Neurologic Normal speech. Oriented to person, place, and time. Epicritic sensation to light touch grossly present bilaterally.  Dermatologic Nails well groomed and normal in appearance. No open wounds. No skin lesions.  Orthopedic: Normal joint ROM without pain or crepitus bilaterally. No visible deformities. Pain motion with the left first MPJ  with limited range of motion, gastroc equinus deformity noted, pain to motion of the posterior Achilles tendon with prominent exostosis, pain with patient with a left fifth hammertoe   Assessment:  Left Haglund's deformity, hallux limitus, left fifth hammertoe Plan:  Patient was evaluated and treated and all questions answered.  Left Haglund's deformity, hallux limitus, left fifth hammertoe -Proceed with surgical correction today -Planned procedures: Left foot cheilectomy, left fifth hammertoe correction, left-spur resection with detachment reattachment of Achilles tendon, gastrocnemius recession  No follow-ups on file.

## 2019-05-17 NOTE — Brief Op Note (Signed)
05/17/2019  2:13 PM  PATIENT:  Cynthia Little  28 y.o. female  PRE-OPERATIVE DIAGNOSIS:  ACILLES TENDONITITS HAMMERTOE, HEELS SPURSHALLUX LIMITUS CAPSULITIS  POST-OPERATIVE DIAGNOSIS:  ACILLES TENDONITITS HAMMERTOE, HEELS   PROCEDURE:  Procedure(s): CHEILECTOMY LEFT FOOT (Left) GASTROCNEMIUS RECESS (Left) CALCANEAL OSTECTOMY (Left) HAMMER TOE CORRECTION FIFTH (Left) MINOR CLOSED MANIPULATION (Left)  SURGEON:  Surgeon(s) and Role:    * Evelina Bucy, DPM - Primary  PHYSICIAN ASSISTANT:   ASSISTANTS: none   ANESTHESIA:   general  EBL:  minimal   BLOOD ADMINISTERED:none  DRAINS: none   LOCAL MEDICATIONS USED:  NONE  SPECIMEN:  No Specimen  DISPOSITION OF SPECIMEN:  N/A  COUNTS:  YES  TOURNIQUET:   Total Tourniquet Time Documented: Thigh (Left) - 45 minutes Thigh (Left) - 101 minutes Total: Thigh (Left) - 146 minutes   DICTATION: .Viviann Spare Dictation  PLAN OF CARE: Discharge to home after PACU  PATIENT DISPOSITION:  PACU - hemodynamically stable.   Delay start of Pharmacological VTE agent (>24hrs) due to surgical blood loss or risk of bleeding: not applicable

## 2019-05-18 ENCOUNTER — Telehealth: Payer: Self-pay

## 2019-05-18 ENCOUNTER — Telehealth: Payer: Self-pay | Admitting: *Deleted

## 2019-05-18 ENCOUNTER — Encounter (HOSPITAL_BASED_OUTPATIENT_CLINIC_OR_DEPARTMENT_OTHER): Payer: Self-pay | Admitting: Podiatry

## 2019-05-18 ENCOUNTER — Telehealth: Payer: Non-veteran care | Admitting: Family

## 2019-05-18 DIAGNOSIS — N760 Acute vaginitis: Secondary | ICD-10-CM

## 2019-05-18 NOTE — Telephone Encounter (Signed)
POST OP CALL-    1) General condition stated by the patient:   2) Is the pt having pain? No, still numb.  3) Pain score:  None, numb.  4) Has the pt taken Rx'd pain medication, regularly or PRN? Yes  5) Is the pain medication giving relief?  6) Any fever, chills, nausea, or vomiting, shortness of breath or tightness in calf? None  7) Is the bandage clean, dry and intact? Yes  8) Is there excessive tightness, bleeding or drainage coming through the bandage? None  9) Did you understand all of the post op instruction sheet given? Yes  10) Any questions or concerns regarding post op care/recovery? No    Confirmed POV appointment with patient

## 2019-05-19 MED ORDER — FLUCONAZOLE 150 MG PO TABS: 150.0000 mg | ORAL_TABLET | Freq: Once | ORAL | 0 refills | Status: AC

## 2019-05-19 NOTE — Telephone Encounter (Signed)
I informed I had pt I had found the paperwork confirming [facility] at the fax number Ronalee Belts had instructed had received the 05/11/2019 forms with cam walker, surgical shoe, knee scooter, and shower chair listed.

## 2019-05-19 NOTE — Telephone Encounter (Signed)
Pt states she was told that her equipment would be ordered from the [place] and they have not.

## 2019-05-19 NOTE — Telephone Encounter (Signed)
Faxed the packet completed and faxed to the [facility] on 05/11/2019 again with the confirmation of being received by the [facility] [place] on the 05/11/2019. I have also received confirmation the pack with my record of 05/11/2019 [facility] confirmation received by the [place] at fax [phone].

## 2019-05-19 NOTE — Progress Notes (Signed)

## 2019-05-19 NOTE — Telephone Encounter (Signed)
I reviewed Chart Review Phone Msg of 05/04/2019 and 05/11/2019 state D. [place] - Surgery Coordinator had spoken to [place] at the [facility] and had sent proper form for DME and I was able to pull faxed paperwork with confirmation that the forms were received at the required [place] fax number [phone]. I informed pt I had [place] telephone conversation with [place] and she had faxed the DME Request for to the [place].

## 2019-05-19 NOTE — Telephone Encounter (Signed)
Called and spoke to patient she has not heard back from the [place] regarding her DME supplies. I advised her to continue to follow up with them as we did order the supplies well in advance of her surgery.  Otherwise pain is controlled she is getting around OK with her crutches with her family's assitance

## 2019-05-22 ENCOUNTER — Telehealth: Payer: Self-pay | Admitting: *Deleted

## 2019-05-22 NOTE — Telephone Encounter (Signed)
I called pt, she could take OTC ibuprofen as the package directs if she is able to tolerate.

## 2019-05-22 NOTE — Telephone Encounter (Signed)
Pt asked if she could take tylenol.

## 2019-05-24 ENCOUNTER — Other Ambulatory Visit: Payer: Self-pay | Admitting: Podiatry

## 2019-05-24 MED ORDER — OXYCODONE-ACETAMINOPHEN 10-325 MG PO TABS: 1.0000 | ORAL_TABLET | ORAL | 0 refills | Status: DC | PRN

## 2019-05-24 MED ORDER — CLINDAMYCIN HCL 300 MG PO CAPS: 300.0000 mg | ORAL_CAPSULE | Freq: Three times a day (TID) | ORAL | 0 refills | Status: DC

## 2019-05-24 NOTE — Telephone Encounter (Signed)
Pt states the [facility] did not get the orders for the post op equipment and she would like a raised commode seat also. Pt states she has another fax [phone] Attn: Mr. Lonia [employer]. Faxed required [place] form [phone] (May 2019) to [employer], Attn:  Mr. Florene Glen.

## 2019-05-24 NOTE — Progress Notes (Signed)
Re: Prn postop pain.  Spoke with the patient yesterday evening after hours regarding pain with low-grade fever postoperatively.  Patient states that she ran out of her pain medication and she has been experiencing a low-grade fever between 99.6-100.6 F.  Patient denies any calf tenderness or pain in the posterior leg.  She relates irritation to the incision site.  Patient currently taking clindamycin 150 mg twice daily.  She is also currently on Lovenox anticoagulant medication for 2 weeks postoperatively.  I recommended OTC Tylenol throughout the night.  Refill prescription sent for Percocet 10/325 mg #20.  I informed the patient we will also increase her clindamycin to 300 mg 3 times daily.  New prescription sent into the pharmacy this morning.  Recommend the patient go to the emergency department if she experiences calf pain or worsening conditions.  Otherwise, follow-up in the office on neck scheduled appointment with Dr. March Rummage, surgeon.  Edrick Kins, DPM [facility]  Dr. Edrick Kins, DPM    [address], [place] [address]                Office [phone]  Fax [phone]

## 2019-05-24 NOTE — Telephone Encounter (Signed)
Pt states the [place] says they either did not get the fax or the person that received the fax is not in the facility. Pt states she has someone from the [place] on the line and she would give me her contact information. While on a 3 way call with pt and [place], I was given Lawerance Bach fax [phone]. Faxed the required [place] form [phone] (May 2019) to [place] Attn: Schuyler Amor.

## 2019-05-25 ENCOUNTER — Ambulatory Visit (INDEPENDENT_AMBULATORY_CARE_PROVIDER_SITE_OTHER): Payer: No Typology Code available for payment source | Admitting: Podiatry

## 2019-05-25 ENCOUNTER — Other Ambulatory Visit: Payer: Self-pay

## 2019-05-25 VITALS — Temp 97.5°F

## 2019-05-25 DIAGNOSIS — M7732 Calcaneal spur, left foot: Secondary | ICD-10-CM | POA: Diagnosis not present

## 2019-05-25 DIAGNOSIS — M779 Enthesopathy, unspecified: Secondary | ICD-10-CM

## 2019-05-25 DIAGNOSIS — M7662 Achilles tendinitis, left leg: Secondary | ICD-10-CM

## 2019-05-25 DIAGNOSIS — M205X2 Other deformities of toe(s) (acquired), left foot: Secondary | ICD-10-CM

## 2019-05-25 NOTE — Progress Notes (Signed)
Subjective:  Patient ID: Cynthia Little, female    DOB: Apr 16, 1991,  MRN: [id number]  Chief Complaint  Patient presents with  . Routine Post Op    DOS 5.27.2020 Cheilectomy, calcaneal osteotomy, hammertoe repair, gastronemius recess left foot. Pt states healing well with no concerns. Pt states her fever resolved quickly after medication adjustment per Dr. Amalia Hailey.     DOS: 05/17/2019 Procedure: Cheilectomy Left foot, Calcaneal ostectomy with heel spur resection, left 5th hammertoe 5th repair, gastroc recession.   28 y.o. female returns for post-op check. Doing well had a little bit of a post op fever, now resolved. Should be receiving her knee scooter and other DME goods today. Pain controlled not having to take much pain medication.  Review of Systems: Negative except as noted in the HPI. Denies N/V/F/Ch.  Past Medical History:  Diagnosis Date  . Anemia   . Asthma   . Bronchitis     Current Outpatient Medications:  .  albuterol (VENTOLIN HFA) 108 (90 Base) MCG/ACT inhaler, Inhale into the lungs every 6 (six) hours as needed for wheezing or shortness of breath., Disp: , Rfl:  .  Apple Cider Vinegar 500 MG TABS, Take by mouth., Disp: , Rfl:  .  brompheniramine-pseudoephedrine-DM 30-2-10 MG/5ML syrup, TAKE 10ML 3 TIMES A DAY AS NEEDED COUGH/CONGESTION, Disp: , Rfl:  .  clindamycin (CLEOCIN) 300 MG capsule, Take 1 capsule (300 mg total) by mouth 3 (three) times daily., Disp: 21 capsule, Rfl: 0 .  Collagen 500 MG CAPS, Take by mouth., Disp: , Rfl:  .  enoxaparin (LOVENOX) 40 MG/0.4ML injection, Inject 0.4 mLs (40 mg total) into the skin every 12 (twelve) hours., Disp: 28 Syringe, Rfl: 0 .  fluconazole (DIFLUCAN) 150 MG tablet, , Disp: , Rfl:  .  Multiple Vitamins-Minerals (ONE DAILY MULTIVITAMIN WOMEN PO), Take by mouth., Disp: , Rfl:  .  ondansetron (ZOFRAN) 4 MG tablet, Take 1 tablet (4 mg total) by mouth every 8 (eight) hours as needed for nausea or vomiting., Disp: 20 tablet, Rfl: 0  .  oxyCODONE-acetaminophen (PERCOCET) 10-325 MG tablet, Take 1 tablet by mouth every 4 (four) hours as needed for pain., Disp: 20 tablet, Rfl: 0 .  Specialty Vitamins Products (BIOTIN PLUS KERATIN) 10000-100 MCG-MG TABS, Take by mouth., Disp: , Rfl:   Social History   Tobacco Use  Smoking Status Never Smoker  Smokeless Tobacco Never Used    Allergies  Allergen Reactions  . Cephalexin Hives and Other (See Comments)    Hyperventilate    Objective:   Vitals:   05/25/19 1103  Temp: (!) 97.5 F (36.4 C)   There is no height or weight on file to calculate BMI. Constitutional Well developed. Well nourished.  Vascular Foot warm and well perfused. Capillary refill normal to all digits.   Neurologic Normal speech. Oriented to person, place, and time. Epicritic sensation to light touch grossly present bilaterally.  Dermatologic Skin healing well without signs of infection. Skin edges well coapted without signs of infection.  Orthopedic: Tenderness to palpation noted about the surgical sites.   Radiographs: None Assessment:   1. Tendonitis, Achilles, left   2. Heel spur, left   3. Capsulitis   4. Hallux limitus of left foot    Plan:  Patient was evaluated and treated and all questions answered.  S/p foot surgery left -Progressing as expected post-operatively. -XR: None -WB Status: NWB in Cast -Sutures: intact. -Medications: None refilled today. -Foot redressed. -Short leg cast applied with 3 rolls of fiberglass casting.  NV intact post procedure  Return in about 1 week (around 06/01/2019).

## 2019-05-26 ENCOUNTER — Telehealth: Payer: Self-pay | Admitting: *Deleted

## 2019-05-26 NOTE — Telephone Encounter (Signed)
[facility] - Dr. Drue Stager states he is calling for information concerning pt.

## 2019-05-26 NOTE — Telephone Encounter (Signed)
I called Dr. Drue Stager and he states he had just spoken to pt 15 minutes ago and she had confirmed her surgery and that she was going to pick up her DME today, and that she stated her pain level was still a 9. I told Dr. Drue Stager pt had not called our office today with complaint of pain, I would give her a call.

## 2019-05-26 NOTE — Telephone Encounter (Signed)
I called pt and informed that I had spoken with Dr. Drue Stager and he had said she had complained of pain. Pt states no she is fine at this time. I asked pt if she had received her DME and she states they are preparing to deliver today. I told pt to call with concerns. Pt states understanding.

## 2019-06-01 ENCOUNTER — Ambulatory Visit (INDEPENDENT_AMBULATORY_CARE_PROVIDER_SITE_OTHER): Payer: Non-veteran care | Admitting: Podiatry

## 2019-06-01 ENCOUNTER — Other Ambulatory Visit: Payer: Self-pay

## 2019-06-01 DIAGNOSIS — Z9889 Other specified postprocedural states: Secondary | ICD-10-CM

## 2019-06-01 DIAGNOSIS — M7662 Achilles tendinitis, left leg: Secondary | ICD-10-CM | POA: Diagnosis not present

## 2019-06-08 ENCOUNTER — Ambulatory Visit (INDEPENDENT_AMBULATORY_CARE_PROVIDER_SITE_OTHER): Payer: Non-veteran care | Admitting: Podiatry

## 2019-06-08 ENCOUNTER — Other Ambulatory Visit: Payer: Self-pay

## 2019-06-08 DIAGNOSIS — Z9889 Other specified postprocedural states: Secondary | ICD-10-CM

## 2019-06-08 DIAGNOSIS — M7662 Achilles tendinitis, left leg: Secondary | ICD-10-CM

## 2019-06-08 MED ORDER — NONFORMULARY OR COMPOUNDED ITEM: 5 refills | Status: DC

## 2019-06-13 NOTE — Op Note (Signed)
Patient Name: Cynthia Little DOB: May 26, 1991  MRN: [id number]   Date of Service: 05/17/2019  Surgeon: Dr. Hardie Pulley, DPM Assistants: None Pre-operative Diagnosis:  Hallux limitus, hammertoe left 5th toe, equinus, Haglund's deformity, heel spur Post-operative Diagnosis:  Same Procedures:  1) Cheilectomy Left 1st Metatarsophalangeal Joint  2) Left 5th Hammertoe Correction  3) Gastrocnemius Recession Left  4) Manipulation of ankle joint under anesthesia  5) Haglund's Recession including detachment and reattchment of Achilles tendon Pathology/Specimens: * No specimens in log * Anesthesia: General, regional Hemostasis:  Total Tourniquet Time Documented: Thigh (Left) - 45 minutes Thigh (Left) - 101 minutes Total: Thigh (Left) - 146 minutes  Estimated Blood Loss: 20 mL Materials:  Implant Name Type Inv. Item Serial No. Manufacturer Lot No. LRB No. Used Action  IMPLANT SYS BIOCOMP ACH SPEED - [id number] Anchor IMPLANT SYS BIOCOMP ACH SPEED  [id number] Left 1 Implanted   Medications: None Complications: None  Indications for Procedure:  This is a 28 y.o. female with the above deformities and chronic pain recalcitrant to conservative care.  I discussed the patient should would benefit from surgical intervention after having failed all conservative care.  All risk benefits and alternatives surgery were discussed with the patient.  No guarantees were given.   Procedure in Detail: Patient was identified in pre-operative holding area. Formal consent was signed and the left lower extremity was marked. Patient was brought back to the operating room.  She was placed on the operating table in the supine position anesthesia was induced. The extremity was prepped and draped in the usual sterile fashion. Timeout was taken to confirm patient name, laterality, and procedure prior to incision.   Cheilectomy L 1st MPJ Attention was then directed to the left first metatarsophalangeal  joint where a linear incision was made overlying the first metatarsal phalangeal joint.  Dissection was carried down through skin and subcutaneous tissue with care to avoid all vital neurovascular structures all bleeders were cauterized electrocautery.  Dissection was carried down to level of the first metatarsal phalangeal joint.  A linear incision made in the metatarsophalangeal joint capsule to expose the first metatarsal head and proximal phalanx.  There was a prominent portion of the first metatarsal head which was resected with a sagittal saw.  Portions of the proximal phalangeal base were resected with a rongeur.  Fluoroscopy was used to confirm adequate resection.  The area felt smooth and there was no more dorsal prominence.  Good range of motion of the first MPJ was observed.  The area was copiously irrigated closed with layers with 2-0 monocryl, 3-0 monocryl, and 4-0 nylon.  Left fifth hammertoe correction Attention was then directed to the fifth toe where 2 semielliptical incisions were made overlying the fifth toe overlying the area of the soft corn.  The corn and lead to elective incisions were completely excised.  Dissection was carried down to the level of the proximal interphalangeal joint.  A portion of the proximal phalangeal head was excised which allowed for straightening of the fifth toe.  The incision was then copiously irrigated.  The joint capsule was repaired with 3-0 Monocryl.  The skin was repaired with 4-0 nylon.  At this point the incisions were then sterilely temporarily dressed.  The patient was then flipped to the prone position.  The foot was then sterilely prepped and redraped with care to maintain sterility to the surgical sites that were already performed.  Left gastrocnemius recession The ankle was brought through its range of motion.  The ankle was only able dorsiflex to about 0 degrees. Incision was then made in the midline of the posterior aspect of the left calf 4  fingerbreadths distal to the muscle belly of the calf muscle.  Dissection was carried down through skin subtenons tissue down to the level of the gastrocnemius fascia.  All neurovascular structures were safely retracted from view including sural nerve.  A 15 blade was used to incise the gastrocnemius fascia and the fascia was incised both medially and laterally with a Metzenbaum scissors.  Release of the fascia was noted and underlying muscle belly was visualized.  The incision was then copiously irrigated and closed in layers with 3-0 Monocryl and skin staples.  Manipulation of ankle under anesthesia The ankle joint was then thoroughly manually manipulated under anesthesia to stretch the posterior musculature and to break up posterior capsular adhesions.  Following thorough repeated manipulation of the ankle joint the ankle was able to be rotated at 10 degrees of dorsiflexion.  Haglund's resection A curvilinear incision was made about the distal aspect of the Achilles tendon with the incision curved slightly medially.  Dissection was carried down through skin space tissue with care to avoid all neurovascular structures.  Dissection was carried down to level of the peritenon.  An incision was made In the Peritenon and the peritenon was gently reflected from the tendon.  The tendon was reflected distally from the underlying calcaneus.  The tendon was reflected up to expose the calcaneus.  Redundant portions of the calcaneus were sequentially reduced with osteotome and sagittal saw.  Resection was continued until no more bony prominence was palpated.  Fluoroscopy was used to confirm adequate resection.  Following adequate resection the calcaneus was prepared for reattachment Achilles tendon.  The [employer] speed bridge kit was used.  Drill holes for 2 swivel lock anchors were drilled proximally into the calcaneus and were inserted under standard technique.  Good seating of the FiberWire was noted.  The  FiberWire's were then crisscrossed according to technique guide.  They were then passed through additional sternal lock anchors which were then drilled and inserted with distal to these drill holes into the calcaneus.  The tendon appeared flush to the bone and good repair of the tendon was noted.  The incision was then finally copiously irrigated.  The incision was then closed in layers with 2-0 and 3-0 monocryl and skin staples.  The foot was then dressed with xeroform, 4x4, Kerlix and Ace bandage. Patient tolerated the procedure well.   Disposition: Following a period of post-operative monitoring, patient will be transferred home.  She will be nonweightbearing to the left lower extremity

## 2019-06-15 ENCOUNTER — Other Ambulatory Visit: Payer: Self-pay

## 2019-06-15 ENCOUNTER — Ambulatory Visit (INDEPENDENT_AMBULATORY_CARE_PROVIDER_SITE_OTHER): Payer: Non-veteran care | Admitting: Podiatry

## 2019-06-15 VITALS — Temp 97.9°F

## 2019-06-15 DIAGNOSIS — M7662 Achilles tendinitis, left leg: Secondary | ICD-10-CM

## 2019-06-15 DIAGNOSIS — M7752 Other enthesopathy of left foot: Secondary | ICD-10-CM

## 2019-06-15 DIAGNOSIS — M7732 Calcaneal spur, left foot: Secondary | ICD-10-CM

## 2019-06-15 DIAGNOSIS — Z9889 Other specified postprocedural states: Secondary | ICD-10-CM

## 2019-06-15 DIAGNOSIS — M205X2 Other deformities of toe(s) (acquired), left foot: Secondary | ICD-10-CM

## 2019-06-15 DIAGNOSIS — M2040 Other hammer toe(s) (acquired), unspecified foot: Secondary | ICD-10-CM

## 2019-06-15 NOTE — Progress Notes (Signed)
Subjective:  Patient ID: Cynthia Little, female    DOB: Apr 22, 1991,  MRN: [id number]  Chief Complaint  Patient presents with  . Routine Post Op    " my foot is still sore but I think it is getting better"     DOS: 05/17/2019 Procedure: Cheilectomy Left foot, Calcaneal ostectomy with heel spur resection, left 5th hammertoe 5th repair, gastroc recession.   28 y.o. female returns for post-op check.  Endorses some soreness but otherwise doing rather well thinks is getting better.  Review of Systems: Negative except as noted in the HPI. Denies N/V/F/Ch.  Past Medical History:  Diagnosis Date  . Anemia   . Asthma   . Bronchitis     Current Outpatient Medications:  .  albuterol (VENTOLIN HFA) 108 (90 Base) MCG/ACT inhaler, Inhale into the lungs every 6 (six) hours as needed for wheezing or shortness of breath., Disp: , Rfl:  .  Apple Cider Vinegar 500 MG TABS, Take by mouth., Disp: , Rfl:  .  brompheniramine-pseudoephedrine-DM 30-2-10 MG/5ML syrup, TAKE 10ML 3 TIMES A DAY AS NEEDED COUGH/CONGESTION, Disp: , Rfl:  .  clindamycin (CLEOCIN) 300 MG capsule, Take 1 capsule (300 mg total) by mouth 3 (three) times daily., Disp: 21 capsule, Rfl: 0 .  Collagen 500 MG CAPS, Take by mouth., Disp: , Rfl:  .  enoxaparin (LOVENOX) 40 MG/0.4ML injection, Inject 0.4 mLs (40 mg total) into the skin every 12 (twelve) hours., Disp: 28 Syringe, Rfl: 0 .  fluconazole (DIFLUCAN) 150 MG tablet, , Disp: , Rfl:  .  Multiple Vitamins-Minerals (ONE DAILY MULTIVITAMIN WOMEN PO), Take by mouth., Disp: , Rfl:  .  NONFORMULARY OR COMPOUNDED ITEM, [facility]:  Scar Cream - Verapamil 10%, Pentoxifylline 5%, apply to affected area 3-4 times daily., Disp: 100 each, Rfl: 5 .  ondansetron (ZOFRAN) 4 MG tablet, Take 1 tablet (4 mg total) by mouth every 8 (eight) hours as needed for nausea or vomiting., Disp: 20 tablet, Rfl: 0 .  oxyCODONE-acetaminophen (PERCOCET) 10-325 MG tablet, Take 1 tablet by mouth every 4  (four) hours as needed for pain., Disp: 20 tablet, Rfl: 0 .  Specialty Vitamins Products (BIOTIN PLUS KERATIN) 10000-100 MCG-MG TABS, Take by mouth., Disp: , Rfl:   Social History   Tobacco Use  Smoking Status Never Smoker  Smokeless Tobacco Never Used    Allergies  Allergen Reactions  . Cephalexin Hives and Other (See Comments)    Hyperventilate    Objective:   Vitals:   06/15/19 1500  Temp: 97.9 F (36.6 C)   There is no height or weight on file to calculate BMI. Constitutional Well developed. Well nourished.  Vascular Foot warm and well perfused. Capillary refill normal to all digits.   Neurologic Normal speech. Oriented to person, place, and time. Epicritic sensation to light touch grossly present bilaterally.  Dermatologic Skin well healed. Staples intact posterior leg an Achilles.  Orthopedic: Mild tenderness to palpation noted about the surgical sites. Intact    Radiographs: None Assessment:   1. Post-operative state    Plan:  Patient was evaluated and treated and all questions answered.  S/p foot surgery left -Progressing as expected post-operatively. -XR: None -WB Status: NWB in cam walker boot -Sutures: Out.  Every other staples left intact the posterior Achilles. -Medications: None refilled today. -Foot redressed. -No cast reapplied today.  Patient to start physical therapy focused on passive range of motion exercises and progress to active range of motion exercises  We will plan to progress  weightbearing in 2 weeks  No follow-ups on file.

## 2019-06-16 ENCOUNTER — Ambulatory Visit (INDEPENDENT_AMBULATORY_CARE_PROVIDER_SITE_OTHER): Payer: Non-veteran care | Admitting: Podiatry

## 2019-06-16 ENCOUNTER — Ambulatory Visit (INDEPENDENT_AMBULATORY_CARE_PROVIDER_SITE_OTHER): Payer: No Typology Code available for payment source

## 2019-06-16 DIAGNOSIS — M7662 Achilles tendinitis, left leg: Secondary | ICD-10-CM

## 2019-06-16 DIAGNOSIS — M205X2 Other deformities of toe(s) (acquired), left foot: Secondary | ICD-10-CM | POA: Diagnosis not present

## 2019-06-16 DIAGNOSIS — Z09 Encounter for follow-up examination after completed treatment for conditions other than malignant neoplasm: Secondary | ICD-10-CM | POA: Diagnosis not present

## 2019-06-16 DIAGNOSIS — M7732 Calcaneal spur, left foot: Secondary | ICD-10-CM

## 2019-06-19 ENCOUNTER — Telehealth: Payer: Self-pay

## 2019-06-19 ENCOUNTER — Encounter: Payer: Self-pay | Admitting: Podiatry

## 2019-06-19 NOTE — Progress Notes (Signed)
  Subjective:  Patient ID: Cynthia Little, female    DOB: [birth year],  MRN: [id number]  No chief complaint on file.   DOS: 05/17/2019 Procedure: Cheilectomy Left foot, Calcaneal ostectomy with heel spur resection, left 5th hammertoe 5th repair, gastroc recession.   28 y.o. female returns for post-op check.  Has been doing well pain is been controlled denies issues with the cast.  Review of Systems: Negative except as noted in the HPI. Denies N/V/F/Ch.  Past Medical History:  Diagnosis Date  . Anemia   . Asthma   . Bronchitis     Current Outpatient Medications:  .  albuterol (VENTOLIN HFA) 108 (90 Base) MCG/ACT inhaler, Inhale into the lungs every 6 (six) hours as needed for wheezing or shortness of breath., Disp: , Rfl:  .  Apple Cider Vinegar 500 MG TABS, Take by mouth., Disp: , Rfl:  .  brompheniramine-pseudoephedrine-DM 30-2-10 MG/5ML syrup, TAKE 10ML 3 TIMES A DAY AS NEEDED COUGH/CONGESTION, Disp: , Rfl:  .  clindamycin (CLEOCIN) 300 MG capsule, Take 1 capsule (300 mg total) by mouth 3 (three) times daily., Disp: 21 capsule, Rfl: 0 .  Collagen 500 MG CAPS, Take by mouth., Disp: , Rfl:  .  enoxaparin (LOVENOX) 40 MG/0.4ML injection, Inject 0.4 mLs (40 mg total) into the skin every 12 (twelve) hours., Disp: 28 Syringe, Rfl: 0 .  fluconazole (DIFLUCAN) 150 MG tablet, , Disp: , Rfl:  .  Multiple Vitamins-Minerals (ONE DAILY MULTIVITAMIN WOMEN PO), Take by mouth., Disp: , Rfl:  .  NONFORMULARY OR COMPOUNDED ITEM, [facility]:  Scar Cream - Verapamil 10%, Pentoxifylline 5%, apply to affected area 3-4 times daily., Disp: 100 each, Rfl: 5 .  ondansetron (ZOFRAN) 4 MG tablet, Take 1 tablet (4 mg total) by mouth every 8 (eight) hours as needed for nausea or vomiting., Disp: 20 tablet, Rfl: 0 .  oxyCODONE-acetaminophen (PERCOCET) 10-325 MG tablet, Take 1 tablet by mouth every 4 (four) hours as needed for pain., Disp: 20 tablet, Rfl: 0 .  Specialty Vitamins Products (BIOTIN PLUS  KERATIN) 10000-100 MCG-MG TABS, Take by mouth., Disp: , Rfl:   Social History   Tobacco Use  Smoking Status Never Smoker  Smokeless Tobacco Never Used    Allergies  Allergen Reactions  . Cephalexin Hives and Other (See Comments)    Hyperventilate    Objective:   There were no vitals filed for this visit. There is no height or weight on file to calculate BMI. Constitutional Well developed. Well nourished.  Vascular Foot warm and well perfused. Capillary refill normal to all digits.   Neurologic Normal speech. Oriented to person, place, and time. Epicritic sensation to light touch grossly present bilaterally.  Dermatologic Skin healing well without signs of infection. Skin edges well coapted without signs of infection.  Orthopedic:  Mild to palpation noted about the surgical sites.   Radiographs: None Assessment:   1. Post-operative state   2. Tendonitis, Achilles, left    Plan:  Patient was evaluated and treated and all questions answered.  S/p foot surgery left -Progressing as expected post-operatively. -XR: None -WB Status: NWB in Cast -Sutures: Removed.  Staples left intact. -Rx verapamil scar cream due to patient's tendency to keloid -Short leg cast reapplied.  3 layers of fiberglass tape applied  No follow-ups on file.

## 2019-06-20 NOTE — Progress Notes (Signed)
  Subjective:  Patient ID: Cynthia Little, female    DOB: 1991/03/15,  MRN: [id number]  No chief complaint on file.   DOS: 05/17/2019 Procedure: Cheilectomy Left foot, Calcaneal ostectomy with heel spur resection, left 5th hammertoe 5th repair, gastroc recession.   28 y.o. female returns for post-op check. Doing well no pain. Denies chest pain, SOB. Reports slight tingling in her toes but thinks its some irritation from the cast rubbing in the back.  Review of Systems: Negative except as noted in the HPI. Denies N/V/F/Ch.  Past Medical History:  Diagnosis Date  . Anemia   . Asthma   . Bronchitis     Current Outpatient Medications:  .  albuterol (VENTOLIN HFA) 108 (90 Base) MCG/ACT inhaler, Inhale into the lungs every 6 (six) hours as needed for wheezing or shortness of breath., Disp: , Rfl:  .  Apple Cider Vinegar 500 MG TABS, Take by mouth., Disp: , Rfl:  .  brompheniramine-pseudoephedrine-DM 30-2-10 MG/5ML syrup, TAKE 10ML 3 TIMES A DAY AS NEEDED COUGH/CONGESTION, Disp: , Rfl:  .  clindamycin (CLEOCIN) 300 MG capsule, Take 1 capsule (300 mg total) by mouth 3 (three) times daily., Disp: 21 capsule, Rfl: 0 .  Collagen 500 MG CAPS, Take by mouth., Disp: , Rfl:  .  enoxaparin (LOVENOX) 40 MG/0.4ML injection, Inject 0.4 mLs (40 mg total) into the skin every 12 (twelve) hours., Disp: 28 Syringe, Rfl: 0 .  fluconazole (DIFLUCAN) 150 MG tablet, , Disp: , Rfl:  .  Multiple Vitamins-Minerals (ONE DAILY MULTIVITAMIN WOMEN PO), Take by mouth., Disp: , Rfl:  .  NONFORMULARY OR COMPOUNDED ITEM, [facility]:  Scar Cream - Verapamil 10%, Pentoxifylline 5%, apply to affected area 3-4 times daily., Disp: 100 each, Rfl: 5 .  ondansetron (ZOFRAN) 4 MG tablet, Take 1 tablet (4 mg total) by mouth every 8 (eight) hours as needed for nausea or vomiting., Disp: 20 tablet, Rfl: 0 .  oxyCODONE-acetaminophen (PERCOCET) 10-325 MG tablet, Take 1 tablet by mouth every 4 (four) hours as needed for pain.,  Disp: 20 tablet, Rfl: 0 .  Specialty Vitamins Products (BIOTIN PLUS KERATIN) 10000-100 MCG-MG TABS, Take by mouth., Disp: , Rfl:   Social History   Tobacco Use  Smoking Status Never Smoker  Smokeless Tobacco Never Used    Allergies  Allergen Reactions  . Cephalexin Hives and Other (See Comments)    Hyperventilate    Objective:   There were no vitals filed for this visit. There is no height or weight on file to calculate BMI. Constitutional Well developed. Well nourished.  Vascular Foot warm and well perfused. Capillary refill normal to all digits.   Neurologic Normal speech. Oriented to person, place, and time. Epicritic sensation to light touch grossly present bilaterally.  Dermatologic Skin healing well without signs of infection. Skin edges well coapted without signs of infection.  Orthopedic: Tenderness to palpation noted about the surgical sites. Achilles appears intact without dell or gapping.   Radiographs: None Assessment:   1. Post-operative state   2. Tendonitis, Achilles, left    Plan:  Patient was evaluated and treated and all questions answered.  S/p foot surgery left -Progressing as expected post-operatively. -XR: None -WB Status: NWB in Cast -Sutures: intact. -Medications: None refilled today. -Foot redressed. -Cast reapplied with 3 rolls of fiberglass cast tape.  No follow-ups on file.

## 2019-06-22 NOTE — Progress Notes (Signed)
Subjective:   Patient ID: Cynthia Little, female   DOB: 28 y.o.   MRN: [id number]   HPI Patient had Achilles tendon surgery done and states that fell on her foot and was just concerned   ROS      Objective:  Physical Exam  Neurovascular status that negative Homans sign noted with what appears to be good function of the Achilles tendon but difficult to make complete determination due to splinting by patient because of pain.  Incision sites healed well wound edges coapted well fixation in place      Assessment:  Appears to be doing well but I cannot rule out that there may not be pathology associated with the injury she sustained     Plan:  H&P reviewed x-ray and at this point reapplied sterile dressing immobilization continue complete nonweightbearing and will be reevaluated by Dr. March Rummage in approximately 1 week.  I do not think tear of the Achilles tendon has occurred but that will be reevaluated again in a week by Dr. Halina Maidens indicated resection of posterior heel left has occurred

## 2019-06-26 NOTE — Telephone Encounter (Signed)
Orders were faxed and patient has DME.

## 2019-06-29 ENCOUNTER — Ambulatory Visit (INDEPENDENT_AMBULATORY_CARE_PROVIDER_SITE_OTHER): Payer: Self-pay | Admitting: Podiatry

## 2019-06-29 ENCOUNTER — Other Ambulatory Visit: Payer: Self-pay

## 2019-06-29 DIAGNOSIS — Z9889 Other specified postprocedural states: Secondary | ICD-10-CM

## 2019-07-05 ENCOUNTER — Telehealth: Payer: Self-pay | Admitting: Podiatry

## 2019-07-05 NOTE — Telephone Encounter (Signed)
Pt called wanting to know the status of her being referred for physical therapy.

## 2019-07-06 ENCOUNTER — Telehealth: Payer: Self-pay | Admitting: *Deleted

## 2019-07-06 DIAGNOSIS — M7662 Achilles tendinitis, left leg: Secondary | ICD-10-CM

## 2019-07-06 DIAGNOSIS — M7732 Calcaneal spur, left foot: Secondary | ICD-10-CM

## 2019-07-06 DIAGNOSIS — M205X2 Other deformities of toe(s) (acquired), left foot: Secondary | ICD-10-CM

## 2019-07-06 DIAGNOSIS — Z09 Encounter for follow-up examination after completed treatment for conditions other than malignant neoplasm: Secondary | ICD-10-CM

## 2019-07-06 NOTE — Telephone Encounter (Signed)
Pt called to check on the status of the PT referral from [place].

## 2019-07-06 NOTE — Telephone Encounter (Signed)
[place] Referral specialist states pt has been approved for 15 PT visit, and copied and highlighted the referral information for PT to be given to [facility] - In-office.

## 2019-07-07 NOTE — Telephone Encounter (Signed)
Orders to [facility] with demographics and [facility] pre-cert.

## 2019-07-13 ENCOUNTER — Other Ambulatory Visit: Payer: Self-pay

## 2019-07-13 ENCOUNTER — Ambulatory Visit (INDEPENDENT_AMBULATORY_CARE_PROVIDER_SITE_OTHER): Payer: Non-veteran care | Admitting: Podiatry

## 2019-07-13 DIAGNOSIS — Z9889 Other specified postprocedural states: Secondary | ICD-10-CM

## 2019-07-13 NOTE — Progress Notes (Signed)
  Subjective:  Patient ID: Cynthia Little, female    DOB: 14-Jul-1991,  MRN: [id number]  No chief complaint on file.   DOS: 05/17/2019 Procedure: Cheilectomy Left foot, Calcaneal ostectomy with heel spur resection, left 5th hammertoe 5th repair, gastroc recession.   28 y.o. female returns for post-op check. Doing very well but still has not started physical therapy. Feels her ankle starting to get tight.  Review of Systems: Negative except as noted in the HPI. Denies N/V/F/Ch.  Past Medical History:  Diagnosis Date  . Anemia   . Asthma   . Bronchitis     Current Outpatient Medications:  .  albuterol (VENTOLIN HFA) 108 (90 Base) MCG/ACT inhaler, Inhale into the lungs every 6 (six) hours as needed for wheezing or shortness of breath., Disp: , Rfl:  .  Apple Cider Vinegar 500 MG TABS, Take by mouth., Disp: , Rfl:  .  brompheniramine-pseudoephedrine-DM 30-2-10 MG/5ML syrup, TAKE 10ML 3 TIMES A DAY AS NEEDED COUGH/CONGESTION, Disp: , Rfl:  .  clindamycin (CLEOCIN) 300 MG capsule, Take 1 capsule (300 mg total) by mouth 3 (three) times daily., Disp: 21 capsule, Rfl: 0 .  Collagen 500 MG CAPS, Take by mouth., Disp: , Rfl:  .  enoxaparin (LOVENOX) 40 MG/0.4ML injection, Inject 0.4 mLs (40 mg total) into the skin every 12 (twelve) hours., Disp: 28 Syringe, Rfl: 0 .  fluconazole (DIFLUCAN) 150 MG tablet, , Disp: , Rfl:  .  Multiple Vitamins-Minerals (ONE DAILY MULTIVITAMIN WOMEN PO), Take by mouth., Disp: , Rfl:  .  NONFORMULARY OR COMPOUNDED ITEM, [facility]:  Scar Cream - Verapamil 10%, Pentoxifylline 5%, apply to affected area 3-4 times daily., Disp: 100 each, Rfl: 5 .  ondansetron (ZOFRAN) 4 MG tablet, Take 1 tablet (4 mg total) by mouth every 8 (eight) hours as needed for nausea or vomiting., Disp: 20 tablet, Rfl: 0 .  oxyCODONE-acetaminophen (PERCOCET) 10-325 MG tablet, Take 1 tablet by mouth every 4 (four) hours as needed for pain., Disp: 20 tablet, Rfl: 0 .  Specialty Vitamins  Products (BIOTIN PLUS KERATIN) 10000-100 MCG-MG TABS, Take by mouth., Disp: , Rfl:   Social History   Tobacco Use  Smoking Status Never Smoker  Smokeless Tobacco Never Used    Allergies  Allergen Reactions  . Cephalexin Hives and Other (See Comments)    Hyperventilate    Objective:   There were no vitals filed for this visit. There is no height or weight on file to calculate BMI. Constitutional Well developed. Well nourished.  Vascular Foot warm and well perfused. Capillary refill normal to all digits.   Neurologic Normal speech. Oriented to person, place, and time. Epicritic sensation to light touch grossly present bilaterally.  Dermatologic Skin well healed.  Orthopedic: No tenderness to palpation noted about the surgical sites. Intact    Radiographs: None Assessment:   1. Post-operative state    Plan:  Patient was evaluated and treated and all questions answered.  S/p foot surgery left -Progressing as expected post-operatively. -XR: None -WB Status: NWB in cam walker boot -Sutures: out. Skin well healed. -Medications: None refilled today. -Foot redressed. -Still has not started physical therapy.  It is imperative she start ASAP to prevent contracture or worsening.  F/u in 1 month for recheck.  No follow-ups on file.

## 2019-07-13 NOTE — Progress Notes (Signed)
  Subjective:  Patient ID: Cynthia Little, female    DOB: Sep 02, 1991,  MRN: [id number]  No chief complaint on file.   DOS: 05/17/2019 Procedure: Cheilectomy Left foot, Calcaneal ostectomy with heel spur resection, left 5th hammertoe 5th repair, gastroc recession.   28 y.o. female returns for post-op check.  No new fall since last visit.  Pain controlled denies new issues  Review of Systems: Negative except as noted in the HPI. Denies N/V/F/Ch.  Past Medical History:  Diagnosis Date  . Anemia   . Asthma   . Bronchitis     Current Outpatient Medications:  .  albuterol (VENTOLIN HFA) 108 (90 Base) MCG/ACT inhaler, Inhale into the lungs every 6 (six) hours as needed for wheezing or shortness of breath., Disp: , Rfl:  .  Apple Cider Vinegar 500 MG TABS, Take by mouth., Disp: , Rfl:  .  brompheniramine-pseudoephedrine-DM 30-2-10 MG/5ML syrup, TAKE 10ML 3 TIMES A DAY AS NEEDED COUGH/CONGESTION, Disp: , Rfl:  .  clindamycin (CLEOCIN) 300 MG capsule, Take 1 capsule (300 mg total) by mouth 3 (three) times daily., Disp: 21 capsule, Rfl: 0 .  Collagen 500 MG CAPS, Take by mouth., Disp: , Rfl:  .  enoxaparin (LOVENOX) 40 MG/0.4ML injection, Inject 0.4 mLs (40 mg total) into the skin every 12 (twelve) hours., Disp: 28 Syringe, Rfl: 0 .  fluconazole (DIFLUCAN) 150 MG tablet, , Disp: , Rfl:  .  Multiple Vitamins-Minerals (ONE DAILY MULTIVITAMIN WOMEN PO), Take by mouth., Disp: , Rfl:  .  NONFORMULARY OR COMPOUNDED ITEM, [facility]:  Scar Cream - Verapamil 10%, Pentoxifylline 5%, apply to affected area 3-4 times daily., Disp: 100 each, Rfl: 5 .  ondansetron (ZOFRAN) 4 MG tablet, Take 1 tablet (4 mg total) by mouth every 8 (eight) hours as needed for nausea or vomiting., Disp: 20 tablet, Rfl: 0 .  oxyCODONE-acetaminophen (PERCOCET) 10-325 MG tablet, Take 1 tablet by mouth every 4 (four) hours as needed for pain., Disp: 20 tablet, Rfl: 0 .  Specialty Vitamins Products (BIOTIN PLUS KERATIN)  10000-100 MCG-MG TABS, Take by mouth., Disp: , Rfl:   Social History   Tobacco Use  Smoking Status Never Smoker  Smokeless Tobacco Never Used    Allergies  Allergen Reactions  . Cephalexin Hives and Other (See Comments)    Hyperventilate    Objective:   There were no vitals filed for this visit. There is no height or weight on file to calculate BMI. Constitutional Well developed. Well nourished.  Vascular Foot warm and well perfused. Capillary refill normal to all digits.   Neurologic Normal speech. Oriented to person, place, and time. Epicritic sensation to light touch grossly present bilaterally.  Dermatologic Skin well healed. Staples intact posterior leg an Achilles.  Orthopedic: Mild tenderness to palpation noted about the surgical sites. Intact    Radiographs: None Assessment:   1. Post-operative state    Plan:  Patient was evaluated and treated and all questions answered.  S/p foot surgery left -Progressing as expected post-operatively. -XR: None -WB Status: NWB in cam walker boot -Sutures: Remaining suture and staples removed. -Medications: None refilled today. -Foot redressed. -Still has not started physical therapy.  Continue to work on passive range of motion exercises progress to active range of motion exercises will expect to progress weightbearing  We will plan to progress weightbearing in 2 weeks  Return in about 2 weeks (around 07/13/2019) for Post-op.

## 2019-07-19 ENCOUNTER — Telehealth: Payer: Self-pay | Admitting: *Deleted

## 2019-07-19 NOTE — Telephone Encounter (Signed)
Charmayne Sheer - [facility] Referral Coordinator prepared cover sheet for [place] fax and LOV.

## 2019-07-19 NOTE — Telephone Encounter (Signed)
[place] Referral Coordinator spoke with pt, she was informed that clinicals should be faxed directly to her [place] PCP and must state pt needs to start PT due to her condition. Dr. Kalman Shan fax [phone].

## 2019-08-04 ENCOUNTER — Ambulatory Visit: Payer: No Typology Code available for payment source | Attending: Podiatry | Admitting: Physical Therapy

## 2019-08-04 ENCOUNTER — Encounter: Payer: Self-pay | Admitting: Physical Therapy

## 2019-08-04 ENCOUNTER — Other Ambulatory Visit: Payer: Self-pay

## 2019-08-04 DIAGNOSIS — M25552 Pain in left hip: Secondary | ICD-10-CM | POA: Insufficient documentation

## 2019-08-04 DIAGNOSIS — M25561 Pain in right knee: Secondary | ICD-10-CM | POA: Diagnosis not present

## 2019-08-04 DIAGNOSIS — M6281 Muscle weakness (generalized): Secondary | ICD-10-CM | POA: Diagnosis not present

## 2019-08-04 DIAGNOSIS — M25572 Pain in left ankle and joints of left foot: Secondary | ICD-10-CM | POA: Diagnosis not present

## 2019-08-04 DIAGNOSIS — J40 Bronchitis, not specified as acute or chronic: Secondary | ICD-10-CM | POA: Insufficient documentation

## 2019-08-04 DIAGNOSIS — G8929 Other chronic pain: Secondary | ICD-10-CM | POA: Diagnosis not present

## 2019-08-04 DIAGNOSIS — M25562 Pain in left knee: Secondary | ICD-10-CM | POA: Diagnosis not present

## 2019-08-04 DIAGNOSIS — D649 Anemia, unspecified: Secondary | ICD-10-CM | POA: Insufficient documentation

## 2019-08-04 DIAGNOSIS — R262 Difficulty in walking, not elsewhere classified: Secondary | ICD-10-CM | POA: Diagnosis present

## 2019-08-04 NOTE — Therapy (Signed)
[place], [place], [address] Phone: [phone]   Fax:  [phone]  Physical Therapy Evaluation  Patient Details  Name: Cynthia Little MRN: [id number] Date of Birth: 05-Sep-1991 Referring Provider (PT): Dr. March Rummage    Encounter Date: 08/04/2019  PT End of Session - 08/04/19 1133    Visit Number  1    Number of Visits  15    Date for PT Re-Evaluation  09/29/19    Authorization Type  [facility] - 15 visits approved    PT Start Time  0920    PT Stop Time  1000    PT Time Calculation (min)  40 min    Activity Tolerance  Patient tolerated treatment well    Behavior During Therapy  [facility] for tasks assessed/performed       Past Medical History:  Diagnosis Date  . Anemia   . Asthma   . Bronchitis     Past Surgical History:  Procedure Laterality Date  . CALCANEAL OSTEOTOMY Left 05/17/2019   Procedure: CALCANEAL OSTECTOMY;  Surgeon: Evelina Bucy, DPM;  Location: [place];  Service: Podiatry;  Laterality: Left;  . CESAREAN SECTION  08/11/2012   Procedure: CESAREAN SECTION;  Surgeon: Melina Schools, MD;  Location: [facility];  Service: Gynecology;  Laterality: N/A;  . CHEILECTOMY Left 05/17/2019   Procedure: CHEILECTOMY LEFT FOOT;  Surgeon: Evelina Bucy, DPM;  Location: [place];  Service: Podiatry;  Laterality: Left;  Marland Kitchen GASTROC RECESSION EXTREMITY Left 05/17/2019   Procedure: GASTROCNEMIUS RECESS;  Surgeon: Evelina Bucy, DPM;  Location: [place];  Service: Podiatry;  Laterality: Left;  . HAMMER TOE SURGERY Left 05/17/2019   Procedure: HAMMER TOE CORRECTION FIFTH;  Surgeon: Evelina Bucy, DPM;  Location: [place];  Service: Podiatry;  Laterality: Left;  . MINOR CLOSED MANIPULATION Left 05/17/2019   Procedure: MINOR CLOSED MANIPULATION;  Surgeon: Evelina Bucy, DPM;  Location: [place];  Service: Podiatry;  Laterality: Left;   . NO PAST SURGERIES      There were no vitals filed for this visit.   Subjective Assessment - 08/04/19 0926    Subjective  This patient underwent extensive surgery for L LE on 05/17/19, no surgical complications.  She is NWB and saw PT at the [facility] for a consult.  She is starting to feel stiffness and cramping in her ankle and is anxious to get started.  Uses a scooter for now and is Indpendent .  Her young son helps her at home, has mother to help as well.  She is working full time and they have supported her recovery. Had PT for her knee and ankle in the past but ultimately chose surgery.  She may need to have her Rt one done as well.    Pertinent History  bone spurs bil heels, bil knee pain    Limitations  Sitting;Walking;Lifting;House hold activities;Standing    How long can you stand comfortably?  NWB    Diagnostic tests  Cheilectomy Left foot, Calcaneal ostectomy with heel spur resection, left 5th hammertoe 5th repair, gastroc recession.    Patient Stated Goals  be more active    Currently in Pain?  Yes   none current   Pain Score  8     Pain Location  Ankle    Pain Orientation  Left    Pain Descriptors / Indicators  Tingling;Tightness    Pain Type  Surgical  pain    Pain Onset  More than a month ago    Pain Frequency  Intermittent    Aggravating Factors   positioned too long    Pain Relieving Factors  roll ankle around, elevation, has started getting spasms and her warm compresses help    Effect of Pain on Daily Activities  limits her activity and work         [facility] PT Assessment - 08/04/19 0001      Assessment   Medical Diagnosis  L foot/ankle surgery     Referring Provider (PT)  Dr. March Rummage     Onset Date/Surgical Date  05/17/19    Next MD Visit  08/17/19     Prior Therapy  yes       Precautions   Precautions  None    Required Braces or Orthoses  Other Brace/Splint    Other Brace/Splint  CAM boot and compression stocking       Restrictions   Weight Bearing Restrictions   Yes    LLE Weight Bearing  Non weight bearing      Balance Screen   Has the patient fallen in the past 6 months  Yes    How many times?  2    Has the patient had a decrease in activity level because of a fear of falling?   Yes    Is the patient reluctant to leave their home because of a fear of falling?   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    Leisure  walking, running, likes being outdoors , son is 7      Cognition   Overall Cognitive Status  Within Functional Limits for tasks assessed      Observation/Other Assessments   Focus on Therapeutic Outcomes (FOTO)   56%      Observation/Other Assessments-Edema    Edema  Figure 8      Figure 8 Edema   Figure 8 - Right   21 3/4 inch     Figure 8 - Left   21 3/4 inch       Sensation   Light Touch  Impaired by gross assessment    Additional Comments  numb around toes and along achilles and incisions      AROM   Overall AROM Comments  Lt gr toe flex 15 deg, ext 40 deg     Right Ankle Dorsiflexion  11    Right Ankle Plantar Flexion  50    Left Ankle Dorsiflexion  5    Left Ankle Plantar Flexion  38    Left Ankle Inversion  18    Left Ankle Eversion  16      Strength   Left Ankle Dorsiflexion  4+/5    Left Ankle Plantar Flexion  4/5    Left Ankle Inversion  4+/5    Left Ankle Eversion  4/5      Palpation   Palpation comment  min tenderness along incison at Gastroc , none otherwise , dorsal foot gets puffy at times  Objective measurements completed on examination: See above findings.      [facility] Adult PT Treatment/Exercise - 08/04/19 0001      Self-Care   Self-Care  Heat/Ice  Application;Other Self-Care Comments    Heat/Ice Application  Ok for both, ice if edema     Other Self-Care Comments   RICE, HEP , POC      Ankle Exercises: Stretches   Gastroc Stretch Limitations  hamstring and calf x 2 x 30 sec       Ankle Exercises: Seated   ABC's  1 rep    Towel Crunch  5 reps    Heel Raises  Both;10 reps      Ankle Exercises: Supine   T-Band  eversion x 15 green                PT Short Term Goals - 08/04/19 1001      PT SHORT TERM GOAL #1   Title  independent with initial HEP for L ankle AROM, strength    Time  4    Period  Weeks    Status  New    Target Date  09/01/19      PT SHORT TERM GOAL #2   Title  Pt will be able to stand, walk in her home with pain min overall, least restrictive asst device as MD allows    Time  4    Period  Weeks    Status  New    Target Date  09/01/19      PT SHORT TERM GOAL #3   Title  Pt will be able to perform SLS exercises on LLE, min dynamic for 30 sec to work toward full ankle function    Time  4    Period  Weeks    Status  New    Target Date  09/01/19      PT SHORT TERM GOAL #4   Title  Pt will note less swelling in LLE with normal daily activities    Time  4    Period  Weeks    Status  New    Target Date  09/01/19        PT Long Term Goals - 08/04/19 1030      PT LONG TERM GOAL #1   Title  Pt will improve FOTO score to less than 35% impaired    Baseline  56%    Time  8    Period  Weeks    Status  New    Target Date  09/29/19      PT LONG TERM GOAL #2   Title  Pt will be able to squat with no pain (min tension) in LLE for fitness, functional mobility    Time  8    Period  Weeks    Status  New    Target Date  09/29/19      PT LONG TERM GOAL #3   Title  report no difficulty with stairs and walking as needed in the community    Time  8    Period  Weeks    Status  New    Target Date  09/29/19      PT LONG TERM GOAL #4   Title  independent with advanced HEP for LE    Time  8     Period  Weeks    Status  New    Target Date  09/29/19  Plan - 08/04/19 1124    Clinical Impression Statement  Patient presents for mod complexity eval of LLE due to multiple surgeries on ankle, foot.  She is still NWB, has not started PT and for this reason she is perhaps a bit behind where she should be.  Her ankle ROM is decent and strength is limited in plantarflexion and eversion.  She has very minimal pain unless she is in one position for an extended period of time.  She is highly moticated and will do well with PT intevention to maximize her functional mobility.    Examination-Activity Limitations  Bathing;Squat;Stairs;Lift;Bed Mobility;Stand;Carry;Transfers;Sit    Examination-Participation Restrictions  Laundry;Shop;Cleaning;Community Activity;Yard Work;Interpersonal Relationship    Stability/Clinical Decision Making  Evolving/Moderate complexity    Clinical Decision Making  Moderate    Rehab Potential  Excellent    PT Frequency  3x / week    PT Duration  6 weeks   5 weeks, may go to 2 x to extend [facility]   PT Treatment/Interventions  ADLs/Self Care Home Management;Therapeutic activities;Patient/family education;Taping;Therapeutic exercise;Cryotherapy;Ultrasound;Moist Heat;Gait training;Stair training;Functional mobility training;Neuromuscular re-education;Manual techniques;Passive range of motion;Scar mobilization;Vasopneumatic Device    PT Next Visit Plan  ankle/foot ROM, manual , T-band    PT Home Exercise Plan  ankle ROM, towel scrunch, eversion, seated PF    Consulted and Agree with Plan of Care  Patient       Patient will benefit from skilled therapeutic intervention in order to improve the following deficits and impairments:  Abnormal gait, Decreased activity tolerance, Decreased balance, Difficulty walking, Increased edema, Impaired flexibility, Decreased strength, Decreased range of motion, Hypomobility, Postural dysfunction, Decreased scar mobility, Increased  fascial restricitons, Pain, Impaired sensation  Visit Diagnosis: 1. Difficulty in walking, not elsewhere classified   2. Pain in left ankle and joints of left foot   3. Muscle weakness (generalized)        Problem List There are no active problems to display for this patient.   Creola Krotz 08/04/2019, 11:35 AM  [facility] [address] [place], [place], [address] Phone: [phone]   Fax:  [phone]  Name: Cynthia Little MRN: [id number] Date of Birth: 1991/10/11   Raeford Razor, PT 08/04/19 11:35 AM Phone: [phone] Fax: [phone]

## 2019-08-07 ENCOUNTER — Telehealth: Payer: Self-pay | Admitting: Podiatry

## 2019-08-07 ENCOUNTER — Ambulatory Visit: Payer: No Typology Code available for payment source | Admitting: Physical Therapy

## 2019-08-07 ENCOUNTER — Encounter: Payer: Self-pay | Admitting: Physical Therapy

## 2019-08-07 ENCOUNTER — Other Ambulatory Visit: Payer: Self-pay

## 2019-08-07 DIAGNOSIS — M25572 Pain in left ankle and joints of left foot: Secondary | ICD-10-CM

## 2019-08-07 DIAGNOSIS — R262 Difficulty in walking, not elsewhere classified: Secondary | ICD-10-CM

## 2019-08-07 DIAGNOSIS — G8929 Other chronic pain: Secondary | ICD-10-CM

## 2019-08-07 DIAGNOSIS — M25552 Pain in left hip: Secondary | ICD-10-CM

## 2019-08-07 DIAGNOSIS — M6281 Muscle weakness (generalized): Secondary | ICD-10-CM

## 2019-08-07 NOTE — Telephone Encounter (Signed)
She is allowed full weightbearing

## 2019-08-07 NOTE — Telephone Encounter (Signed)
Pt called and stated that physical therapy needed to know when she can start putting weight on her foot.   [place]

## 2019-08-07 NOTE — Therapy (Signed)
[place], [place], [address] Phone: [phone]   Fax:  [phone]  Physical Therapy Treatment  Patient Details  Name: Cynthia Little MRN: [id number] Date of Birth: 08/11/1991 Referring Provider (PT): Dr. March Rummage    Encounter Date: 08/07/2019  PT End of Session - 08/07/19 1431    Visit Number  2    Number of Visits  15    Date for PT Re-Evaluation  09/29/19    Authorization Type  [facility] - 15 visits approved    PT Start Time  0998    PT Stop Time  1500    PT Time Calculation (min)  40 min    Activity Tolerance  Patient tolerated treatment well    Behavior During Therapy  [facility] for tasks assessed/performed       Past Medical History:  Diagnosis Date  . Anemia   . Asthma   . Bronchitis     Past Surgical History:  Procedure Laterality Date  . CALCANEAL OSTEOTOMY Left 05/17/2019   Procedure: CALCANEAL OSTECTOMY;  Surgeon: Evelina Bucy, DPM;  Location: [place];  Service: Podiatry;  Laterality: Left;  . CESAREAN SECTION  08/11/2012   Procedure: CESAREAN SECTION;  Surgeon: Melina Schools, MD;  Location: [facility];  Service: Gynecology;  Laterality: N/A;  . CHEILECTOMY Left 05/17/2019   Procedure: CHEILECTOMY LEFT FOOT;  Surgeon: Evelina Bucy, DPM;  Location: [place];  Service: Podiatry;  Laterality: Left;  Marland Kitchen GASTROC RECESSION EXTREMITY Left 05/17/2019   Procedure: GASTROCNEMIUS RECESS;  Surgeon: Evelina Bucy, DPM;  Location: [place];  Service: Podiatry;  Laterality: Left;  . HAMMER TOE SURGERY Left 05/17/2019   Procedure: HAMMER TOE CORRECTION FIFTH;  Surgeon: Evelina Bucy, DPM;  Location: [place];  Service: Podiatry;  Laterality: Left;  . MINOR CLOSED MANIPULATION Left 05/17/2019   Procedure: MINOR CLOSED MANIPULATION;  Surgeon: Evelina Bucy, DPM;  Location: [place];  Service: Podiatry;  Laterality: Left;   . NO PAST SURGERIES      There were no vitals filed for this visit.  Subjective Assessment - 08/07/19 1428    Subjective  Pt arriving to therpay reporting 4/10 in L ankle. Pt reporting shooting sensations in her toes. Pt reported she has been working on her HEP and doing well.    Pertinent History  bone spurs bil heels, bil knee pain    Limitations  Sitting;Walking;Lifting;House hold activities;Standing    How long can you stand comfortably?  NWB    Diagnostic tests  Cheilectomy Left foot, Calcaneal ostectomy with heel spur resection, left 5th hammertoe 5th repair, gastroc recession.    Patient Stated Goals  be more active    Currently in Pain?  Yes    Pain Score  4     Pain Location  Ankle    Pain Orientation  Left    Pain Descriptors / Indicators  Shooting;Sore    Pain Type  Surgical pain    Pain Onset  More than a month ago    Pain Frequency  Intermittent                       [facility] Adult PT Treatment/Exercise - 08/07/19 0001      Self-Care   Other Self-Care Comments   Pt instructed to ice and elevate for 15-20 minutes at home      Ankle Exercises: Stretches  Gastroc Stretch Limitations  hamstring and calf x 2 x 30 sec       Ankle Exercises: Seated   Towel Crunch  5 reps    [place] Pickup  x 10 reps     Heel Raises  Both;10 reps    Toe Raise  10 reps    Other Seated Ankle Exercises  Prostretch x 20 rocking back and forth, 3 holding stretch for 20 seconds    Other Seated Ankle Exercises  rolling tennis ball on bottom of foot      Ankle Exercises: Supine   T-Band  Inversion/Eversion/PF/DF all x 15 reps with green banc      Ankle Exercises: Sidelying   Other Sidelying Ankle Exercises  hip abduction x 15 each LE               PT Short Term Goals - 08/07/19 1436      PT SHORT TERM GOAL #1   Title  independent with initial HEP for L ankle AROM, strength    Time  4    Period  Weeks    Status  On-going      PT SHORT TERM GOAL #2   Title  Pt  will be able to stand, walk in her home with pain min overall, least restrictive asst device as MD allows    Time  4    Period  Weeks    Target Date  09/01/19      PT SHORT TERM GOAL #3   Title  Pt will be able to perform SLS exercises on LLE, min dynamic for 30 sec to work toward full ankle function    Time  4    Period  Weeks    Target Date  09/01/19      PT SHORT TERM GOAL #4   Title  Pt will note less swelling in LLE with normal daily activities    Time  4    Period  Weeks    Status  New    Target Date  09/01/19        PT Long Term Goals - 08/04/19 1030      PT LONG TERM GOAL #1   Title  Pt will improve FOTO score to less than 35% impaired    Baseline  56%    Time  8    Period  Weeks    Status  New    Target Date  09/29/19      PT LONG TERM GOAL #2   Title  Pt will be able to squat with no pain (min tension) in LLE for fitness, functional mobility    Time  8    Period  Weeks    Status  New    Target Date  09/29/19      PT LONG TERM GOAL #3   Title  report no difficulty with stairs and walking as needed in the community    Time  8    Period  Weeks    Status  New    Target Date  09/29/19      PT LONG TERM GOAL #4   Title  independent with advanced HEP for LE    Time  8    Period  Weeks    Status  New    Target Date  09/29/19            Plan - 08/07/19 1432    Clinical Impression Statement  Pt tolerating exercises well. No  increase in pain reported during session. Pt instructed in elevation and ice to reduce swelling. Pt reported that she would contact her MD to verify her weight bearing status. Pt still following NWB in her L LE and using a scooter for mobility.    Examination-Activity Limitations  Bathing;Squat;Stairs;Lift;Bed Mobility;Stand;Carry;Transfers;Sit    Examination-Participation Restrictions  Laundry;Shop;Cleaning;Community Activity;Yard Work;Interpersonal Relationship    Stability/Clinical Decision Making  Evolving/Moderate complexity     Rehab Potential  Excellent    PT Frequency  3x / week    PT Duration  6 weeks    PT Treatment/Interventions  ADLs/Self Care Home Management;Therapeutic activities;Patient/family education;Taping;Therapeutic exercise;Cryotherapy;Ultrasound;Moist Heat;Gait training;Stair training;Functional mobility training;Neuromuscular re-education;Manual techniques;Passive range of motion;Scar mobilization;Vasopneumatic Device    PT Next Visit Plan  ankle/foot ROM, manual , T-band    PT Home Exercise Plan  ankle ROM, towel scrunch, eversion, seated PF    Consulted and Agree with Plan of Care  Patient       Patient will benefit from skilled therapeutic intervention in order to improve the following deficits and impairments:  Abnormal gait, Decreased activity tolerance, Decreased balance, Difficulty walking, Increased edema, Impaired flexibility, Decreased strength, Decreased range of motion, Hypomobility, Postural dysfunction, Decreased scar mobility, Increased fascial restricitons, Pain, Impaired sensation  Visit Diagnosis: 1. Difficulty in walking, not elsewhere classified   2. Pain in left ankle and joints of left foot   3. Muscle weakness (generalized)   4. Chronic pain of right knee   5. Chronic pain of left knee   6. Pain in left hip        Problem List There are no active problems to display for this patient.   Oretha Caprice, PT 08/07/2019, 2:55 PM  [facility] [address] [place], [place], [address] Phone: [phone]   Fax:  [phone]  Name: Shellye Zandi MRN: [id number] Date of Birth: 1991-07-04

## 2019-08-08 NOTE — Telephone Encounter (Signed)
I informed [facility] - Cynthia Little of Dr. March Rummage 08/07/2019 4:42pm orders. I informed pt of orders as well.

## 2019-08-11 ENCOUNTER — Ambulatory Visit: Payer: No Typology Code available for payment source | Admitting: Physical Therapy

## 2019-08-11 ENCOUNTER — Other Ambulatory Visit: Payer: Self-pay

## 2019-08-11 ENCOUNTER — Encounter: Payer: Self-pay | Admitting: Physical Therapy

## 2019-08-11 DIAGNOSIS — G8929 Other chronic pain: Secondary | ICD-10-CM

## 2019-08-11 DIAGNOSIS — M25572 Pain in left ankle and joints of left foot: Secondary | ICD-10-CM

## 2019-08-11 DIAGNOSIS — M25561 Pain in right knee: Secondary | ICD-10-CM

## 2019-08-11 DIAGNOSIS — M25552 Pain in left hip: Secondary | ICD-10-CM

## 2019-08-11 DIAGNOSIS — R262 Difficulty in walking, not elsewhere classified: Secondary | ICD-10-CM

## 2019-08-11 DIAGNOSIS — M6281 Muscle weakness (generalized): Secondary | ICD-10-CM

## 2019-08-11 NOTE — Therapy (Signed)
[place], [place], [address] Phone: [phone]   Fax:  [phone]  Physical Therapy Treatment  Patient Details  Name: Cynthia Little MRN: [id number] Date of Birth: Jul 08, 1991 Referring Provider (PT): Dr. March Rummage    Encounter Date: 08/11/2019  PT End of Session - 08/11/19 0956    Visit Number  3    Number of Visits  15    Date for PT Re-Evaluation  09/29/19    Authorization Type  [place] - 15 visits approved    PT Start Time  0915    PT Stop Time  1000    PT Time Calculation (min)  45 min    Activity Tolerance  Patient tolerated treatment well    Behavior During Therapy  [facility] for tasks assessed/performed       Past Medical History:  Diagnosis Date  . Anemia   . Asthma   . Bronchitis     Past Surgical History:  Procedure Laterality Date  . CALCANEAL OSTEOTOMY Left 05/17/2019   Procedure: CALCANEAL OSTECTOMY;  Surgeon: Evelina Bucy, DPM;  Location: [place];  Service: Podiatry;  Laterality: Left;  . CESAREAN SECTION  08/11/2012   Procedure: CESAREAN SECTION;  Surgeon: Melina Schools, MD;  Location: [facility];  Service: Gynecology;  Laterality: N/A;  . CHEILECTOMY Left 05/17/2019   Procedure: CHEILECTOMY LEFT FOOT;  Surgeon: Evelina Bucy, DPM;  Location: [place];  Service: Podiatry;  Laterality: Left;  Marland Kitchen GASTROC RECESSION EXTREMITY Left 05/17/2019   Procedure: GASTROCNEMIUS RECESS;  Surgeon: Evelina Bucy, DPM;  Location: [place];  Service: Podiatry;  Laterality: Left;  . HAMMER TOE SURGERY Left 05/17/2019   Procedure: HAMMER TOE CORRECTION FIFTH;  Surgeon: Evelina Bucy, DPM;  Location: [place];  Service: Podiatry;  Laterality: Left;  . MINOR CLOSED MANIPULATION Left 05/17/2019   Procedure: MINOR CLOSED MANIPULATION;  Surgeon: Evelina Bucy, DPM;  Location: [place];  Service: Podiatry;  Laterality: Left;   . NO PAST SURGERIES      There were no vitals filed for this visit.  Subjective Assessment - 08/11/19 0951    Subjective  Pt reporting her MD visit went well and Dr. March Rummage released her to full weight bearing in L LE. No pain upon arrival.    Pertinent History  bone spurs bil heels, bil knee pain    Limitations  Sitting;Walking;Lifting;House hold activities;Standing    How long can you stand comfortably?  WBAT on 08/11/2019, receieved order from Dr. March Rummage    Diagnostic tests  Cheilectomy Left foot, Calcaneal ostectomy with heel spur resection, left 5th hammertoe 5th repair, gastroc recession.    Patient Stated Goals  be more active    Currently in Pain?  No/denies         [facility] PT Assessment - 08/11/19 0001      Assessment   Medical Diagnosis  L foot/ankle surgery     Referring Provider (PT)  Dr. March Rummage       Restrictions   Weight Bearing Restrictions  No    LLE Weight Bearing  Weight bearing as tolerated                   [facility] Adult PT Treatment/Exercise - 08/11/19 0001      Ankle Exercises: Stretches   Gastroc Stretch Limitations  hamstring and calf x 2 x 30 sec       Ankle Exercises:  Seated   Towel Crunch  5 reps    [place] Pickup  x 15 reps     Heel Raises  Both;10 reps    Toe Raise  10 reps    Other Seated Ankle Exercises  Prostretch x 20 rocking back and forth, 3 holding stretch for 20 seconds    Other Seated Ankle Exercises  rolling tennis ball on bottom of foot      Ankle Exercises: Supine   T-Band  Inversion/Eversion/PF/DF all x 15 reps with green banc      Ankle Exercises: Aerobic   Recumbent Bike  5 minutes L2      Ankle Exercises: Standing   BAPS  Sitting;Level 2    Heel Raises  Both;10 reps    Other Standing Ankle Exercises  gt training heel to toe no device    x 8 minutes            PT Education - 08/11/19 0955    Education Details  pt advised to keeping wearing her boot/brace when walking community surfaces for stability and  safety.    Person(s) Educated  Patient    Methods  Explanation    Comprehension  Verbalized understanding       PT Short Term Goals - 08/07/19 1436      PT SHORT TERM GOAL #1   Title  independent with initial HEP for L ankle AROM, strength    Time  4    Period  Weeks    Status  On-going      PT SHORT TERM GOAL #2   Title  Pt will be able to stand, walk in her home with pain min overall, least restrictive asst device as MD allows    Time  4    Period  Weeks    Target Date  09/01/19      PT SHORT TERM GOAL #3   Title  Pt will be able to perform SLS exercises on LLE, min dynamic for 30 sec to work toward full ankle function    Time  4    Period  Weeks    Target Date  09/01/19      PT SHORT TERM GOAL #4   Title  Pt will note less swelling in LLE with normal daily activities    Time  4    Period  Weeks    Status  New    Target Date  09/01/19        PT Long Term Goals - 08/04/19 1030      PT LONG TERM GOAL #1   Title  Pt will improve FOTO score to less than 35% impaired    Baseline  56%    Time  8    Period  Weeks    Status  New    Target Date  09/29/19      PT LONG TERM GOAL #2   Title  Pt will be able to squat with no pain (min tension) in LLE for fitness, functional mobility    Time  8    Period  Weeks    Status  New    Target Date  09/29/19      PT LONG TERM GOAL #3   Title  report no difficulty with stairs and walking as needed in the community    Time  8    Period  Weeks    Status  New    Target Date  09/29/19  PT LONG TERM GOAL #4   Title  independent with advanced HEP for LE    Time  8    Period  Weeks    Status  New    Target Date  09/29/19            Plan - 08/11/19 0957    Clinical Impression Statement  Pt tolerating all exercises well, progressing to more wt bearing activities due to order received from Dr. March Rummage for full wt bearing. Pt instructed to continue to wear her boot/brace on community surfaces for safety and stability. Pt  instructed in heel to toe gait pattern with decreaed step length and gait through pattern with decreased stance on L LE. Continue skilled PT and progress toward goals set and PLOF.    Examination-Activity Limitations  Bathing;Squat;Stairs;Lift;Bed Mobility;Stand;Carry;Transfers;Sit    Examination-Participation Restrictions  Laundry;Shop;Cleaning;Community Activity;Yard Work;Interpersonal Relationship    Stability/Clinical Decision Making  Evolving/Moderate complexity    Rehab Potential  Excellent    PT Frequency  3x / week    PT Duration  6 weeks    PT Treatment/Interventions  ADLs/Self Care Home Management;Therapeutic activities;Patient/family education;Taping;Therapeutic exercise;Cryotherapy;Ultrasound;Moist Heat;Gait training;Stair training;Functional mobility training;Neuromuscular re-education;Manual techniques;Passive range of motion;Scar mobilization;Vasopneumatic Device    PT Next Visit Plan  ankle/foot ROM, manual , T-band    PT Home Exercise Plan  ankle ROM, towel scrunch, eversion, seated PF    Consulted and Agree with Plan of Care  Patient       Patient will benefit from skilled therapeutic intervention in order to improve the following deficits and impairments:  Abnormal gait, Decreased activity tolerance, Decreased balance, Difficulty walking, Increased edema, Impaired flexibility, Decreased strength, Decreased range of motion, Hypomobility, Postural dysfunction, Decreased scar mobility, Increased fascial restricitons, Pain, Impaired sensation  Visit Diagnosis: Difficulty in walking, not elsewhere classified  Pain in left ankle and joints of left foot  Muscle weakness (generalized)  Chronic pain of right knee  Chronic pain of left knee  Pain in left hip     Problem List There are no active problems to display for this patient.   Oretha Caprice, PT 08/11/2019, 10:05 AM  [facility] [address] [place],  [place], [address] Phone: [phone]   Fax:  [phone]  Name: Anansa Pruiett MRN: [id number] Date of Birth: 12/22/1990

## 2019-08-14 ENCOUNTER — Encounter: Payer: Self-pay | Admitting: Physical Therapy

## 2019-08-14 ENCOUNTER — Other Ambulatory Visit: Payer: Self-pay

## 2019-08-14 ENCOUNTER — Ambulatory Visit: Payer: No Typology Code available for payment source | Admitting: Physical Therapy

## 2019-08-14 DIAGNOSIS — M25572 Pain in left ankle and joints of left foot: Secondary | ICD-10-CM

## 2019-08-14 DIAGNOSIS — R262 Difficulty in walking, not elsewhere classified: Secondary | ICD-10-CM

## 2019-08-14 DIAGNOSIS — M6281 Muscle weakness (generalized): Secondary | ICD-10-CM

## 2019-08-14 NOTE — Therapy (Signed)
[place], [place], [address] Phone: [phone]   Fax:  [phone]  Physical Therapy Treatment  Patient Details  Name: Cynthia Little MRN: [id number] Date of Birth: 1991/04/18 Referring Provider (PT): Dr. March Rummage    Encounter Date: 08/14/2019  PT End of Session - 08/14/19 0928    Visit Number  4    Number of Visits  15    Date for PT Re-Evaluation  09/29/19    Authorization Type  [place] - 15 visits approved    PT Start Time  0840    PT Stop Time  0930    PT Time Calculation (min)  50 min    Activity Tolerance  Patient tolerated treatment well    Behavior During Therapy  [facility] for tasks assessed/performed       Past Medical History:  Diagnosis Date  . Anemia   . Asthma   . Bronchitis     Past Surgical History:  Procedure Laterality Date  . CALCANEAL OSTEOTOMY Left 05/17/2019   Procedure: CALCANEAL OSTECTOMY;  Surgeon: Evelina Bucy, DPM;  Location: [place];  Service: Podiatry;  Laterality: Left;  . CESAREAN SECTION  08/11/2012   Procedure: CESAREAN SECTION;  Surgeon: Melina Schools, MD;  Location: [place] [facility];  Service: Gynecology;  Laterality: N/A;  . CHEILECTOMY Left 05/17/2019   Procedure: CHEILECTOMY LEFT FOOT;  Surgeon: Evelina Bucy, DPM;  Location: [place];  Service: Podiatry;  Laterality: Left;  Marland Kitchen GASTROC RECESSION EXTREMITY Left 05/17/2019   Procedure: GASTROCNEMIUS RECESS;  Surgeon: Evelina Bucy, DPM;  Location: [place];  Service: Podiatry;  Laterality: Left;  . HAMMER TOE SURGERY Left 05/17/2019   Procedure: HAMMER TOE CORRECTION FIFTH;  Surgeon: Evelina Bucy, DPM;  Location: [place];  Service: Podiatry;  Laterality: Left;  . MINOR CLOSED MANIPULATION Left 05/17/2019   Procedure: MINOR CLOSED MANIPULATION;  Surgeon: Evelina Bucy, DPM;  Location: [place];  Service: Podiatry;  Laterality: Left;   . NO PAST SURGERIES      There were no vitals filed for this visit.  Subjective Assessment - 08/14/19 0845    Subjective  Have been walking in her home with the boot, crutch when she goes out.  No pain    Currently in Pain?  No/denies             [facility] Adult PT Treatment/Exercise - 08/14/19 0001      Knee/Hip Exercises: Standing   Forward Step Up  Left;2 sets;10 reps;Hand Hold: 1;Step Height: 6"    Wall Squat  2 sets;10 reps    Wall Squat Limitations  parallel and then slight turnout       Vasopneumatic   Number Minutes Vasopneumatic   15 minutes    Vasopnuematic Location   Ankle    Vasopneumatic Pressure  Low    Vasopneumatic Temperature   32 deg      Manual Therapy   Manual Therapy  Passive ROM    Passive ROM  Gr toe flexion and extension, ankle DF/PF, metatarsals       Ankle Exercises: Stretches   Soleus Stretch  3 reps;30 seconds    Gastroc Stretch  3 reps;30 seconds    Other Stretch  standing DF on step with manual to anterior ankle x 10       Ankle Exercises: Standing   Heel Raises  Both;15 reps    Balance Beam  with  mirror x 3     Other Standing Ankle Exercises  high knees, tandem stance, weight shift                PT Short Term Goals - 08/07/19 1436      PT SHORT TERM GOAL #1   Title  independent with initial HEP for L ankle AROM, strength    Time  4    Period  Weeks    Status  On-going      PT SHORT TERM GOAL #2   Title  Pt will be able to stand, walk in her home with pain min overall, least restrictive asst device as MD allows    Time  4    Period  Weeks    Target Date  09/01/19      PT SHORT TERM GOAL #3   Title  Pt will be able to perform SLS exercises on LLE, min dynamic for 30 sec to work toward full ankle function    Time  4    Period  Weeks    Target Date  09/01/19      PT SHORT TERM GOAL #4   Title  Pt will note less swelling in LLE with normal daily activities    Time  4    Period  Weeks    Status  New    Target Date   09/01/19        PT Long Term Goals - 08/04/19 1030      PT LONG TERM GOAL #1   Title  Pt will improve FOTO score to less than 35% impaired    Baseline  56%    Time  8    Period  Weeks    Status  New    Target Date  09/29/19      PT LONG TERM GOAL #2   Title  Pt will be able to squat with no pain (min tension) in LLE for fitness, functional mobility    Time  8    Period  Weeks    Status  New    Target Date  09/29/19      PT LONG TERM GOAL #3   Title  report no difficulty with stairs and walking as needed in the community    Time  8    Period  Weeks    Status  New    Target Date  09/29/19      PT LONG TERM GOAL #4   Title  independent with advanced HEP for LE    Time  8    Period  Weeks    Status  New    Target Date  09/29/19            Plan - 08/14/19 [id number]    Clinical Impression Statement  Worked in standing a majority of the session with good tolerance for exercises.  Used Geologist, engineering for feedback. Gait addressed without device, crutch made her lean too much.    PT Treatment/Interventions  ADLs/Self Care Home Management;Therapeutic activities;Patient/family education;Taping;Therapeutic exercise;Cryotherapy;Ultrasound;Moist Heat;Gait training;Stair training;Functional mobility training;Neuromuscular re-education;Manual techniques;Passive range of motion;Scar mobilization;Vasopneumatic Device    PT Next Visit Plan  ankle/foot ROM, manual , T-band, GAIT    PT Home Exercise Plan  ankle ROM, towel scrunch, eversion, seated PF    Consulted and Agree with Plan of Care  Patient       Patient will benefit from skilled therapeutic intervention in order to improve the following deficits and impairments:  Abnormal gait, Decreased activity tolerance, Decreased balance, Difficulty walking, Increased edema, Impaired flexibility, Decreased strength, Decreased range of motion, Hypomobility, Postural dysfunction, Decreased scar mobility, Increased fascial restricitons, Pain, Impaired  sensation  Visit Diagnosis: Difficulty in walking, not elsewhere classified  Pain in left ankle and joints of left foot  Muscle weakness (generalized)     Problem List There are no active problems to display for this patient.   PAA,JENNIFER 08/14/2019, 9:56 AM  [facility] [address] [place], [place], [address] Phone: [phone]   Fax:  [phone]  Name: Cynthia Little MRN: [id number] Date of Birth: [birth year]   Raeford Razor, PT 08/14/19 9:57 AM Phone: [phone] Fax: [phone]

## 2019-08-16 ENCOUNTER — Encounter: Payer: Self-pay | Admitting: Physical Therapy

## 2019-08-16 ENCOUNTER — Ambulatory Visit: Payer: No Typology Code available for payment source | Admitting: Physical Therapy

## 2019-08-16 ENCOUNTER — Other Ambulatory Visit: Payer: Self-pay

## 2019-08-16 DIAGNOSIS — M6281 Muscle weakness (generalized): Secondary | ICD-10-CM

## 2019-08-16 DIAGNOSIS — R262 Difficulty in walking, not elsewhere classified: Secondary | ICD-10-CM | POA: Diagnosis not present

## 2019-08-16 DIAGNOSIS — M25572 Pain in left ankle and joints of left foot: Secondary | ICD-10-CM

## 2019-08-16 NOTE — Therapy (Signed)
[place], [place], [address] Phone: [phone]   Fax:  [phone]  Physical Therapy Treatment  Patient Details  Name: Cynthia Little MRN: [id number] Date of Birth: 05-21-1991 Referring Provider (PT): Dr. March Rummage    Encounter Date: 08/16/2019  PT End of Session - 08/16/19 1216    Visit Number  5    Number of Visits  15    Date for PT Re-Evaluation  09/29/19    Authorization Type  VA - 15 visits approved    PT Start Time  1139    PT Stop Time  1228    PT Time Calculation (min)  49 min    Activity Tolerance  Patient tolerated treatment well    Behavior During Therapy  [facility] for tasks assessed/performed       Past Medical History:  Diagnosis Date  . Anemia   . Asthma   . Bronchitis     Past Surgical History:  Procedure Laterality Date  . CALCANEAL OSTEOTOMY Left 05/17/2019   Procedure: CALCANEAL OSTECTOMY;  Surgeon: Evelina Bucy, DPM;  Location: [place];  Service: Podiatry;  Laterality: Left;  . CESAREAN SECTION  08/11/2012   Procedure: CESAREAN SECTION;  Surgeon: Melina Schools, MD;  Location: [facility];  Service: Gynecology;  Laterality: N/A;  . CHEILECTOMY Left 05/17/2019   Procedure: CHEILECTOMY LEFT FOOT;  Surgeon: Evelina Bucy, DPM;  Location: [place];  Service: Podiatry;  Laterality: Left;  Marland Kitchen GASTROC RECESSION EXTREMITY Left 05/17/2019   Procedure: GASTROCNEMIUS RECESS;  Surgeon: Evelina Bucy, DPM;  Location: [place];  Service: Podiatry;  Laterality: Left;  . HAMMER TOE SURGERY Left 05/17/2019   Procedure: HAMMER TOE CORRECTION FIFTH;  Surgeon: Evelina Bucy, DPM;  Location: [place];  Service: Podiatry;  Laterality: Left;  . MINOR CLOSED MANIPULATION Left 05/17/2019   Procedure: MINOR CLOSED MANIPULATION;  Surgeon: Evelina Bucy, DPM;  Location: [place];  Service: Podiatry;  Laterality: Left;   . NO PAST SURGERIES      There were no vitals filed for this visit.  Subjective Assessment - 08/16/19 1139    Subjective  I can tell a difference in the tension as I walk.  Swollen yesterday I didnt have my stocking on so thats why.    Currently in Pain?  No/denies          [facility] Adult PT Treatment/Exercise - 08/16/19 0001      Knee/Hip Exercises: Stretches   Gastroc Stretch  Left;3 reps      Knee/Hip Exercises: Aerobic   Nustep  5 min L6 LE       Knee/Hip Exercises: Standing   Heel Raises  Both;1 set;20 reps    Heel Raises Limitations  x 10 x 2 (turn in, then out)     Hip Flexion  Stengthening;Both;1 set    Hip Flexion Limitations  balance on floor    Forward Lunges  Left;1 set;10 reps    Forward Lunges Limitations  foot on foam     Functional Squat  1 set;15 reps    SLS  static on foam , multiple       Vasopneumatic   Number Minutes Vasopneumatic   15 minutes    Vasopnuematic Location   Ankle    Vasopneumatic Pressure  Low    Vasopneumatic Temperature   32 deg      Ankle Exercises: Standing   Balance Beam  toe yoga     Other Standing Ankle Exercises  standing towel crunches x 10, 10 sec              PT Education - 08/16/19 1216    Education Details  closed chain HEP    Person(s) Educated  Patient    Methods  Explanation;Handout    Comprehension  Verbalized understanding;Returned demonstration       PT Short Term Goals - 08/07/19 1436      PT SHORT TERM GOAL #1   Title  independent with initial HEP for L ankle AROM, strength    Time  4    Period  Weeks    Status  On-going      PT SHORT TERM GOAL #2   Title  Pt will be able to stand, walk in her home with pain min overall, least restrictive asst device as MD allows    Time  4    Period  Weeks    Target Date  09/01/19      PT SHORT TERM GOAL #3   Title  Pt will be able to perform SLS exercises on LLE, min dynamic for 30 sec to work toward full ankle function    Time  4    Period  Weeks     Target Date  09/01/19      PT SHORT TERM GOAL #4   Title  Pt will note less swelling in LLE with normal daily activities    Time  4    Period  Weeks    Status  New    Target Date  09/01/19        PT Long Term Goals - 08/04/19 1030      PT LONG TERM GOAL #1   Title  Pt will improve FOTO score to less than 35% impaired    Baseline  56%    Time  8    Period  Weeks    Status  New    Target Date  09/29/19      PT LONG TERM GOAL #2   Title  Pt will be able to squat with no pain (min tension) in LLE for fitness, functional mobility    Time  8    Period  Weeks    Status  New    Target Date  09/29/19      PT LONG TERM GOAL #3   Title  report no difficulty with stairs and walking as needed in the community    Time  8    Period  Weeks    Status  New    Target Date  09/29/19      PT LONG TERM GOAL #4   Title  independent with advanced HEP for LE    Time  8    Period  Weeks    Status  New    Target Date  09/29/19            Plan - 08/16/19 1217    Clinical Impression Statement  COntinues to make progress, standing balance limited with and without shoe. Needs 1 UE for stability.  Walked in with 1 crutch but barely uses it, min limp.    PT Treatment/Interventions  ADLs/Self Care Home Management;Therapeutic activities;Patient/family education;Taping;Therapeutic exercise;Cryotherapy;Ultrasound;Moist Heat;Gait training;Stair training;Functional mobility training;Neuromuscular re-education;Manual techniques;Passive range of motion;Scar mobilization;Vasopneumatic Device    PT Next Visit Plan  ankle/foot ROM, manual , T-band, GAIT    PT Home Exercise Plan  ankle ROM, towel  scrunch, eversion, seated PF, partial lunge, SLS    Consulted and Agree with Plan of Care  Patient       Patient will benefit from skilled therapeutic intervention in order to improve the following deficits and impairments:  Abnormal gait, Decreased activity tolerance, Decreased balance, Difficulty walking,  Increased edema, Impaired flexibility, Decreased strength, Decreased range of motion, Hypomobility, Postural dysfunction, Decreased scar mobility, Increased fascial restricitons, Pain, Impaired sensation  Visit Diagnosis: Difficulty in walking, not elsewhere classified  Pain in left ankle and joints of left foot  Muscle weakness (generalized)     Problem List There are no active problems to display for this patient.   PAA,JENNIFER 08/16/2019, 1:23 PM  [place], [place], [address] Phone: [phone]   Fax:  [phone]  Name: Cynthia Little MRN: [id number] Date of Birth: 04-06-1991  Raeford Razor, PT 08/16/19 1:23 PM Phone: [phone] Fax: [phone]

## 2019-08-16 NOTE — Patient Instructions (Signed)
Step 1  Step 2  Single Leg Balance on Foam Pad reps: 10  sets: 2  hold: 30  daily: 1  weekly: 7 Setup  Begin in a standing upright position on a foam surface with your arms resting at your sides.  Movement  Raise both arms and lift one foot off the surface by bending your knee, transferring your weight to your other leg. Hold this position. Tip  Make sure to keep your back straight during the exercise. Do not lose your balance and do not let your legs touch while you are balancing. Step 1  Step 2  Seated Figure 4 Ankle Inversion with Resistance reps: 10  sets: 2  hold: 30  daily: 1  weekly: 7 Setup  Begin sitting upright with one ankle resting on your opposite thigh and a resistance band around that foot. The band should be anchored under your other foot on the floor. Movement  Bend your foot away from your body, then rotate your foot by lifting your toes up toward the ceiling, pulling against the resistance. Slowly return to the starting position and repeat. Tip  Make sure to keep your back straight during the exercise. Step 1  Step 2  Standing Partial Lunge reps: 10  sets: 2  hold: 30  daily: 1  weekly: 7 Setup  Begin in a standing upright position. Movement  Step forward with one foot and lower your body down into a mini lunge position. Straighten your legs back to standing and repeat. Tip  Make sure to maintain your balance and do not let your front knee move forward past your toes.

## 2019-08-17 ENCOUNTER — Other Ambulatory Visit: Payer: Self-pay

## 2019-08-17 ENCOUNTER — Ambulatory Visit (INDEPENDENT_AMBULATORY_CARE_PROVIDER_SITE_OTHER): Payer: No Typology Code available for payment source

## 2019-08-17 ENCOUNTER — Ambulatory Visit (INDEPENDENT_AMBULATORY_CARE_PROVIDER_SITE_OTHER): Payer: Non-veteran care | Admitting: Podiatry

## 2019-08-17 DIAGNOSIS — M2042 Other hammer toe(s) (acquired), left foot: Secondary | ICD-10-CM

## 2019-08-21 ENCOUNTER — Ambulatory Visit: Payer: No Typology Code available for payment source | Admitting: Physical Therapy

## 2019-08-21 ENCOUNTER — Encounter: Payer: Self-pay | Admitting: Physical Therapy

## 2019-08-21 ENCOUNTER — Other Ambulatory Visit: Payer: Self-pay

## 2019-08-21 DIAGNOSIS — M25572 Pain in left ankle and joints of left foot: Secondary | ICD-10-CM

## 2019-08-21 DIAGNOSIS — M6281 Muscle weakness (generalized): Secondary | ICD-10-CM

## 2019-08-21 DIAGNOSIS — R262 Difficulty in walking, not elsewhere classified: Secondary | ICD-10-CM

## 2019-08-21 NOTE — Progress Notes (Signed)
Subjective:  Patient ID: Cynthia Little, female    DOB: [birth year],  MRN: [id number]  Chief Complaint  Patient presents with  . Routine Post Op    Pt states left foot has some change in sensation with temperature and textures.    DOS: 05/17/2019 Procedure: Cheilectomy Left foot, Calcaneal ostectomy with heel spur resection, left 5th hammertoe 5th repair, gastroc recession.   28 y.o. female returns for post-op check. The patient presents for followup of left foot surgery.  States that she started physical therapy, has gone to couple of visits, still having some tenderness in the posterior heel.  States that the left foot feels sensitive to temperature and the texture felt different on the foot.  Has had improving range of motion with physical therapy.  Review of Systems: Negative except as noted in the HPI. Denies N/V/F/Ch.  Past Medical History:  Diagnosis Date  . Anemia   . Asthma   . Bronchitis     Current Outpatient Medications:  .  albuterol (VENTOLIN HFA) 108 (90 Base) MCG/ACT inhaler, Inhale into the lungs every 6 (six) hours as needed for wheezing or shortness of breath., Disp: , Rfl:  .  Apple Cider Vinegar 500 MG TABS, Take by mouth., Disp: , Rfl:  .  brompheniramine-pseudoephedrine-DM 30-2-10 MG/5ML syrup, TAKE 10ML 3 TIMES A DAY AS NEEDED COUGH/CONGESTION, Disp: , Rfl:  .  clindamycin (CLEOCIN) 300 MG capsule, Take 1 capsule (300 mg total) by mouth 3 (three) times daily., Disp: 21 capsule, Rfl: 0 .  Collagen 500 MG CAPS, Take by mouth., Disp: , Rfl:  .  enoxaparin (LOVENOX) 40 MG/0.4ML injection, Inject 0.4 mLs (40 mg total) into the skin every 12 (twelve) hours., Disp: 28 Syringe, Rfl: 0 .  fluconazole (DIFLUCAN) 150 MG tablet, , Disp: , Rfl:  .  Multiple Vitamins-Minerals (ONE DAILY MULTIVITAMIN WOMEN PO), Take by mouth., Disp: , Rfl:  .  NONFORMULARY OR COMPOUNDED ITEM, [facility]:  Scar Cream - Verapamil 10%, Pentoxifylline 5%, apply to affected area 3-4  times daily., Disp: 100 each, Rfl: 5 .  ondansetron (ZOFRAN) 4 MG tablet, Take 1 tablet (4 mg total) by mouth every 8 (eight) hours as needed for nausea or vomiting., Disp: 20 tablet, Rfl: 0 .  oxyCODONE-acetaminophen (PERCOCET) 10-325 MG tablet, Take 1 tablet by mouth every 4 (four) hours as needed for pain., Disp: 20 tablet, Rfl: 0 .  Specialty Vitamins Products (BIOTIN PLUS KERATIN) 10000-100 MCG-MG TABS, Take by mouth., Disp: , Rfl:   Social History   Tobacco Use  Smoking Status Never Smoker  Smokeless Tobacco Never Used    Allergies  Allergen Reactions  . Cephalexin Hives and Other (See Comments)    Hyperventilate    Objective:   There were no vitals filed for this visit. There is no height or weight on file to calculate BMI.  Left foot all surgical wounds healed, thin scar noted to the first metatarsophalangeal joint and fifth toe, slightly healed scar to the posterior aspect of the left heel.  Good ankle range of motion.  Good achilles strength.  Achilles tendon without palpable dull.  No pain with palpation about the posterior Achilles.  Good range of motion first metatarsophalangeal joint.  No warmth, no erythema, no signs of acute infection.    Radiographs:  Taken and reviewed.  No interval changes.  No acute fracture or dislocations consistent with postoperative study. Assessment:   1. Hammer toe of left foot    Plan:  Patient was evaluated and  treated and all questions answered.   Status post left foot surgery  - The patient progressing as expected postoperatively.   -  Now that she has finally gone to physical therapy I think that she is likely to continue to improve.  We did discuss progressing to a surgical shoe with physical therapy.  The surgical shoe was dispensed today.  I think she can progress with therapy to putting on the shoe.  We have her follow up in several weeks at which point I expected her to be in no surgical shoe.  Her Achilles does appear to be  healing well and she is having no pain.  We did discuss that sensation is likely to take a long time to go back to normal and she will likely see this result with time.  We will have the patient follow up in several weeks for recheck.  F/u in 1 month for recheck.  No follow-ups on file.

## 2019-08-21 NOTE — Therapy (Signed)
[place], [place], [address] Phone: [phone]   Fax:  [phone]  Physical Therapy Treatment  Patient Details  Name: Cynthia Little MRN: [id number] Date of Birth: [birth year] Referring Provider (PT): Dr. March Rummage    Encounter Date: 08/21/2019  PT End of Session - 08/21/19 1510    Visit Number  6    Number of Visits  15    Date for PT Re-Evaluation  09/29/19    Authorization Type  [place] - 15 visits approved    PT Start Time  1455   pt late   PT Stop Time  1545    PT Time Calculation (min)  50 min    Activity Tolerance  Patient tolerated treatment well    Behavior During Therapy  [facility] for tasks assessed/performed       Past Medical History:  Diagnosis Date  . Anemia   . Asthma   . Bronchitis     Past Surgical History:  Procedure Laterality Date  . CALCANEAL OSTEOTOMY Left 05/17/2019   Procedure: CALCANEAL OSTECTOMY;  Surgeon: Evelina Bucy, DPM;  Location: [place];  Service: Podiatry;  Laterality: Left;  . CESAREAN SECTION  08/11/2012   Procedure: CESAREAN SECTION;  Surgeon: Melina Schools, MD;  Location: [facility];  Service: Gynecology;  Laterality: N/A;  . CHEILECTOMY Left 05/17/2019   Procedure: CHEILECTOMY LEFT FOOT;  Surgeon: Evelina Bucy, DPM;  Location: [place];  Service: Podiatry;  Laterality: Left;  Marland Kitchen GASTROC RECESSION EXTREMITY Left 05/17/2019   Procedure: GASTROCNEMIUS RECESS;  Surgeon: Evelina Bucy, DPM;  Location: [place];  Service: Podiatry;  Laterality: Left;  . HAMMER TOE SURGERY Left 05/17/2019   Procedure: HAMMER TOE CORRECTION FIFTH;  Surgeon: Evelina Bucy, DPM;  Location: [place];  Service: Podiatry;  Laterality: Left;  . MINOR CLOSED MANIPULATION Left 05/17/2019   Procedure: MINOR CLOSED MANIPULATION;  Surgeon: Evelina Bucy, DPM;  Location: [place];  Service: Podiatry;   Laterality: Left;  . NO PAST SURGERIES      There were no vitals filed for this visit.  Subjective Assessment - 08/21/19 1505    Subjective  Its a little sore today.  4/10-5/10.    Currently in Pain?  Yes    Pain Score  5     Pain Location  Foot    Pain Orientation  Left    Pain Descriptors / Indicators  Sore;Aching    Pain Type  Surgical pain    Pain Onset  More than a month ago    Pain Frequency  Intermittent    Aggravating Factors   walking too much    Pain Relieving Factors  RICE        [facility] Adult PT Treatment/Exercise - 08/21/19 0001      Pilates   Pilates Reformer  Footwork 2 Red 1 blue heels, foreftoot in parallel and turnout.  Heel raises in neutral and in turnout.  Running and stretch to bilateral plantar fascia       Knee/Hip Exercises: Stretches   Active Hamstring Stretch  2 reps;30 seconds    Active Hamstring Stretch Limitations  and calf       Knee/Hip Exercises: Standing   Functional Squat  1 set;15 reps      Vasopneumatic   Number Minutes Vasopneumatic   15 minutes    Vasopnuematic Location   Ankle    Vasopneumatic Pressure  Low  Vasopneumatic Temperature   32 deg      Ankle Exercises: Standing   SLS  for UE min dynamic    row and cross body rot. , needs Rt UE to touch for balance   Other Standing Ankle Exercises  unable to stand only on LLE and perform UE ex      Ankle Exercises: Supine   T-Band  combo green band (Eversion/DF, inversion/DF) with PT asist x 10 each         PT Short Term Goals - 08/21/19 1512      PT SHORT TERM GOAL #1   Title  independent with initial HEP for L ankle AROM, strength    Status  Achieved      PT SHORT TERM GOAL #2   Title  Pt will be able to stand, walk in her home with pain min overall, least restrictive asst device as MD allows    Status  Achieved      PT SHORT TERM GOAL #3   Title  Pt will be able to perform SLS exercises on LLE, min dynamic for 30 sec to work toward full ankle function      PT SHORT  TERM GOAL #4   Title  Pt will note less swelling in LLE with normal daily activities    Status  Achieved        PT Long Term Goals - 08/21/19 1513      PT LONG TERM GOAL #1   Title  Pt will improve FOTO score to less than 35% impaired    Status  On-going      PT LONG TERM GOAL #2   Title  Pt will be able to squat with no pain (min tension) in LLE for fitness, functional mobility    Status  On-going      PT LONG TERM GOAL #3   Title  report no difficulty with stairs and walking as needed in the community    Status  On-going      PT LONG TERM GOAL #4   Title  independent with advanced HEP for LE    Status  On-going            Plan - 08/21/19 1524    Clinical Impression Statement  Used Pilates Reformer today for footwork, strength and flexibility.  Surgical shoe she was given last week is not comfortable to her so she did not bring it in.  Lacks static stability on L LE , needs UE to maintain static or min dynamic.  Progressing towards goals.    PT Treatment/Interventions  ADLs/Self Care Home Management;Therapeutic activities;Patient/family education;Taping;Therapeutic exercise;Cryotherapy;Ultrasound;Moist Heat;Gait training;Stair training;Functional mobility training;Neuromuscular re-education;Manual techniques;Passive range of motion;Scar mobilization;Vasopneumatic Device    PT Next Visit Plan  ankle/foot ROM, manual , T-band, GAIT, SLS    PT Home Exercise Plan  ankle ROM, towel scrunch, eversion, seated PF, partial lunge, SLS    Consulted and Agree with Plan of Care  Patient       Patient will benefit from skilled therapeutic intervention in order to improve the following deficits and impairments:  Abnormal gait, Decreased activity tolerance, Decreased balance, Difficulty walking, Increased edema, Impaired flexibility, Decreased strength, Decreased range of motion, Hypomobility, Postural dysfunction, Decreased scar mobility, Increased fascial restricitons, Pain, Impaired  sensation  Visit Diagnosis: Difficulty in walking, not elsewhere classified  Pain in left ankle and joints of left foot  Muscle weakness (generalized)     Problem List There are no active problems to  display for this patient.   Cynthia Little 08/21/2019, 5:25 PM  [facility] [address] [place], [place], [address] Phone: [phone]   Fax:  [phone]  Name: Cynthia Little MRN: [id number] Date of Birth: 12-02-1991   Raeford Razor, PT 08/21/19 5:26 PM Phone: [phone] Fax: [phone]

## 2019-08-23 ENCOUNTER — Ambulatory Visit: Payer: No Typology Code available for payment source | Attending: Podiatry | Admitting: Physical Therapy

## 2019-08-23 ENCOUNTER — Other Ambulatory Visit: Payer: Self-pay

## 2019-08-23 DIAGNOSIS — M25572 Pain in left ankle and joints of left foot: Secondary | ICD-10-CM | POA: Diagnosis not present

## 2019-08-23 DIAGNOSIS — M6281 Muscle weakness (generalized): Secondary | ICD-10-CM | POA: Diagnosis not present

## 2019-08-23 DIAGNOSIS — R262 Difficulty in walking, not elsewhere classified: Secondary | ICD-10-CM | POA: Diagnosis not present

## 2019-08-23 NOTE — Therapy (Signed)
[place], [place], [address] Phone: [phone]   Fax:  [phone]  Physical Therapy Treatment  Patient Details  Name: Cynthia Little MRN: [id number] Date of Birth: Aug 22, 1991 Referring Provider (PT): Dr. March Rummage    Encounter Date: 08/23/2019  PT End of Session - 08/23/19 1515    Visit Number  7    Number of Visits  15    Date for PT Re-Evaluation  09/29/19    Authorization Type  [place] - 15 visits approved    PT Start Time  [id number]    PT Stop Time  1547    PT Time Calculation (min)  54 min    Activity Tolerance  Patient tolerated treatment well    Behavior During Therapy  [facility] for tasks assessed/performed       Past Medical History:  Diagnosis Date  . Anemia   . Asthma   . Bronchitis     Past Surgical History:  Procedure Laterality Date  . CALCANEAL OSTEOTOMY Left 05/17/2019   Procedure: CALCANEAL OSTECTOMY;  Surgeon: Evelina Bucy, DPM;  Location: [place];  Service: Podiatry;  Laterality: Left;  . CESAREAN SECTION  08/11/2012   Procedure: CESAREAN SECTION;  Surgeon: Melina Schools, MD;  Location: [facility];  Service: Gynecology;  Laterality: N/A;  . CHEILECTOMY Left 05/17/2019   Procedure: CHEILECTOMY LEFT FOOT;  Surgeon: Evelina Bucy, DPM;  Location: [place];  Service: Podiatry;  Laterality: Left;  Marland Kitchen GASTROC RECESSION EXTREMITY Left 05/17/2019   Procedure: GASTROCNEMIUS RECESS;  Surgeon: Evelina Bucy, DPM;  Location: [place];  Service: Podiatry;  Laterality: Left;  . HAMMER TOE SURGERY Left 05/17/2019   Procedure: HAMMER TOE CORRECTION FIFTH;  Surgeon: Evelina Bucy, DPM;  Location: [place];  Service: Podiatry;  Laterality: Left;  . MINOR CLOSED MANIPULATION Left 05/17/2019   Procedure: MINOR CLOSED MANIPULATION;  Surgeon: Evelina Bucy, DPM;  Location: [place];  Service: Podiatry;  Laterality: Left;  .  NO PAST SURGERIES      There were no vitals filed for this visit.  Subjective Assessment - 08/23/19 1511    Subjective  A little sore.  Asked about swelling in lateral ankle.    Currently in Pain?  Yes    Pain Score  3        [facility] Adult PT Treatment/Exercise - 08/23/19 0001      Knee/Hip Exercises: Stretches   Gastroc Stretch  Left;3 reps    Soleus Stretch  Left;3 reps      Modalities   Modalities  Cryotherapy      Vasopneumatic   Number Minutes Vasopneumatic   15 minutes    Vasopnuematic Location   Ankle    Vasopneumatic Pressure  Low    Vasopneumatic Temperature   32 deg      Manual Therapy   Manual Therapy  Edema management;Joint mobilization;Soft tissue mobilization;Passive ROM    Joint Mobilization  metatarsal     Soft tissue mobilization  scar tissue     Passive ROM  ankle DF, Gr Toe flex, ext       Ankle Exercises: Standing   Heel Raises  Both;10 reps   2 sets, slow and off step      Ankle Exercises: Stretches   Plantar Fascia Stretch  3 reps;30 seconds   off step      Ankle Exercises: Machines for Strengthening   Cybex Leg Press  1 plate x 15 , double then single leg , calf raise 1 plate  x 10              PT Education - 08/23/19 1514    Education Details  swelling    Person(s) Educated  Patient    Methods  Explanation    Comprehension  Verbalized understanding       PT Short Term Goals - 08/21/19 1512      PT SHORT TERM GOAL #1   Title  independent with initial HEP for L ankle AROM, strength    Status  Achieved      PT SHORT TERM GOAL #2   Title  Pt will be able to stand, walk in her home with pain min overall, least restrictive asst device as MD allows    Status  Achieved      PT SHORT TERM GOAL #3   Title  Pt will be able to perform SLS exercises on LLE, min dynamic for 30 sec to work toward full ankle function      PT SHORT TERM GOAL #4   Title  Pt will note less swelling in LLE with normal daily activities    Status  Achieved         PT Long Term Goals - 08/21/19 1513      PT LONG TERM GOAL #1   Title  Pt will improve FOTO score to less than 35% impaired    Status  On-going      PT LONG TERM GOAL #2   Title  Pt will be able to squat with no pain (min tension) in LLE for fitness, functional mobility    Status  On-going      PT LONG TERM GOAL #3   Title  report no difficulty with stairs and walking as needed in the community    Status  On-going      PT LONG TERM GOAL #4   Title  independent with advanced HEP for LE    Status  On-going            Plan - 08/23/19 1516    Clinical Impression Statement  Worked on scar mobility today and ankle ROM, Gr Toe.  Felt much better after as she walked over to the equipment.  Unable to heel raise unilaterally on LLE or eccentrically.    PT Treatment/Interventions  ADLs/Self Care Home Management;Therapeutic activities;Patient/family education;Taping;Therapeutic exercise;Cryotherapy;Ultrasound;Moist Heat;Gait training;Stair training;Functional mobility training;Neuromuscular re-education;Manual techniques;Passive range of motion;Scar mobilization;Vasopneumatic Device    PT Next Visit Plan  ankle/foot ROM, manual , T-band, GAIT, SLS    PT Home Exercise Plan  ankle ROM, towel scrunch, eversion, seated PF, partial lunge, SLS    Consulted and Agree with Plan of Care  Patient       Patient will benefit from skilled therapeutic intervention in order to improve the following deficits and impairments:  Abnormal gait, Decreased activity tolerance, Decreased balance, Difficulty walking, Increased edema, Impaired flexibility, Decreased strength, Decreased range of motion, Hypomobility, Postural dysfunction, Decreased scar mobility, Increased fascial restricitons, Pain, Impaired sensation  Visit Diagnosis: Difficulty in walking, not elsewhere classified  Pain in left ankle and joints of left foot  Muscle weakness (generalized)     Problem List There are no active  problems to display for this patient.   Hiedi Touchton 08/23/2019, 4:16 PM  [facility], [place], [address] Phone: [phone]   Fax:  [phone]  Name: Lucella Depaepe  MRN: [id number] Date of Birth: [birth year]  Raeford Razor, PT 08/23/19 4:16 PM Phone: [phone] Fax: [phone]

## 2019-08-25 ENCOUNTER — Other Ambulatory Visit: Payer: Self-pay

## 2019-08-25 ENCOUNTER — Ambulatory Visit: Payer: No Typology Code available for payment source | Admitting: Physical Therapy

## 2019-08-25 ENCOUNTER — Encounter: Payer: Self-pay | Admitting: Physical Therapy

## 2019-08-25 DIAGNOSIS — R262 Difficulty in walking, not elsewhere classified: Secondary | ICD-10-CM | POA: Diagnosis not present

## 2019-08-25 DIAGNOSIS — M25572 Pain in left ankle and joints of left foot: Secondary | ICD-10-CM

## 2019-08-25 DIAGNOSIS — M6281 Muscle weakness (generalized): Secondary | ICD-10-CM

## 2019-08-25 NOTE — Therapy (Signed)
[place], [place], [address] Phone: [phone]   Fax:  [phone]  Physical Therapy Treatment  Patient Details  Name: Cynthia Little MRN: [id number] Date of Birth: Mar 28, 1991 Referring Provider (PT): Dr. March Rummage    Encounter Date: 08/25/2019  PT End of Session - 08/25/19 0924    Visit Number  8    Number of Visits  15    Date for PT Re-Evaluation  09/29/19    Authorization Type  [place] - 15 visits approved    PT Start Time  0918    PT Stop Time  1010    PT Time Calculation (min)  52 min    Activity Tolerance  Patient tolerated treatment well    Behavior During Therapy  [facility] for tasks assessed/performed       Past Medical History:  Diagnosis Date  . Anemia   . Asthma   . Bronchitis     Past Surgical History:  Procedure Laterality Date  . CALCANEAL OSTEOTOMY Left 05/17/2019   Procedure: CALCANEAL OSTECTOMY;  Surgeon: Evelina Bucy, DPM;  Location: [place];  Service: Podiatry;  Laterality: Left;  . CESAREAN SECTION  08/11/2012   Procedure: CESAREAN SECTION;  Surgeon: Melina Schools, MD;  Location: [facility];  Service: Gynecology;  Laterality: N/A;  . CHEILECTOMY Left 05/17/2019   Procedure: CHEILECTOMY LEFT FOOT;  Surgeon: Evelina Bucy, DPM;  Location: [place];  Service: Podiatry;  Laterality: Left;  Marland Kitchen GASTROC RECESSION EXTREMITY Left 05/17/2019   Procedure: GASTROCNEMIUS RECESS;  Surgeon: Evelina Bucy, DPM;  Location: [place];  Service: Podiatry;  Laterality: Left;  . HAMMER TOE SURGERY Left 05/17/2019   Procedure: HAMMER TOE CORRECTION FIFTH;  Surgeon: Evelina Bucy, DPM;  Location: [place];  Service: Podiatry;  Laterality: Left;  . MINOR CLOSED MANIPULATION Left 05/17/2019   Procedure: MINOR CLOSED MANIPULATION;  Surgeon: Evelina Bucy, DPM;  Location: [facility];  Service: Podiatry;  Laterality: Left;  .  NO PAST SURGERIES      There were no vitals filed for this visit.  Subjective Assessment - 08/25/19 0931    Subjective  not hurting much today.  Got new sneakers but htey are too small    Currently in Pain?  Yes    Pain Score  2           [facility] Adult PT Treatment/Exercise - 08/25/19 0001      Knee/Hip Exercises: Stretches   Hip Flexor Stretch  Both;3 reps    Hip Flexor Stretch Limitations  standing       Knee/Hip Exercises: Aerobic   Elliptical  5 MIn L 10 ramp,  L 1 resist       Knee/Hip Exercises: Standing   Hip Abduction  Stengthening;Both;1 set;10 reps    Abduction Limitations  on foam     Forward Step Up  Left;1 set;15 reps;Hand Hold: 1;Step Height: 6"    Step Down  Left;1 set;10 reps    Step Down Limitations  needs UE     Functional Squat  2 sets;15 reps    Functional Squat Limitations  in moirror, added heel lift       Vasopneumatic   Number Minutes Vasopneumatic   15 minutes    Vasopnuematic Location   Ankle    Vasopneumatic Pressure  Low    Vasopneumatic Temperature   32 deg      Ankle Exercises: Standing  SLS  foam needed UE support     Heel Raises  Both;15 reps    Heel Walk (Round Trip)  in mirror     Other Standing Ankle Exercises  standing balance on foam and on the floor, static, min dynamic       Ankle Exercises: Stretches   Gastroc Stretch Limitations  with towel seated x 3     Slant Board Stretch  3 reps;30 seconds               PT Short Term Goals - 08/25/19 [id number]      PT SHORT TERM GOAL #1   Title  independent with initial HEP for L ankle AROM, strength    Status  Achieved      PT SHORT TERM GOAL #2   Title  Pt will be able to stand, walk in her home with pain min overall, least restrictive asst device as MD allows    Status  Achieved      PT SHORT TERM GOAL #3   Title  Pt will be able to perform SLS exercises on LLE, min dynamic for 30 sec to work toward full ankle function    Status  On-going      PT SHORT TERM GOAL #4    Title  Pt will note less swelling in LLE with normal daily activities    Status  Achieved        PT Long Term Goals - 08/21/19 1513      PT LONG TERM GOAL #1   Title  Pt will improve FOTO score to less than 35% impaired    Status  On-going      PT LONG TERM GOAL #2   Title  Pt will be able to squat with no pain (min tension) in LLE for fitness, functional mobility    Status  On-going      PT LONG TERM GOAL #3   Title  report no difficulty with stairs and walking as needed in the community    Status  On-going      PT LONG TERM GOAL #4   Title  independent with advanced HEP for LE    Status  On-going            Plan - 08/25/19 1002    Clinical Impression Statement  Worked on gait today, heel strike.  Lacks hip extension as well. Improving graduallly.    PT Treatment/Interventions  ADLs/Self Care Home Management;Therapeutic activities;Patient/family education;Taping;Therapeutic exercise;Cryotherapy;Ultrasound;Moist Heat;Gait training;Stair training;Functional mobility training;Neuromuscular re-education;Manual techniques;Passive range of motion;Scar mobilization;Vasopneumatic Device    PT Next Visit Plan  ankle/foot ROM, manual , T-band, GAIT, SLS    PT Home Exercise Plan  ankle ROM, towel scrunch, eversion, seated PF, partial lunge, SLS    Consulted and Agree with Plan of Care  Patient       Patient will benefit from skilled therapeutic intervention in order to improve the following deficits and impairments:  Abnormal gait, Decreased activity tolerance, Decreased balance, Difficulty walking, Increased edema, Impaired flexibility, Decreased strength, Decreased range of motion, Hypomobility, Postural dysfunction, Decreased scar mobility, Increased fascial restricitons, Pain, Impaired sensation  Visit Diagnosis: Difficulty in walking, not elsewhere classified  Pain in left ankle and joints of left foot  Muscle weakness (generalized)     Problem List There are no active  problems to display for this patient.   , 08/25/2019, 4:43 PM  [facility] [address] [place], [place], [address] Phone:  [phone]   Fax:  [phone]  Name: Cynthia Little MRN: [id number] Date of Birth: February 17, 1991  Raeford Razor, PT 08/25/19 4:43 PM Phone: [phone] Fax: [phone]

## 2019-08-29 ENCOUNTER — Ambulatory Visit: Payer: No Typology Code available for payment source | Admitting: Physical Therapy

## 2019-08-29 ENCOUNTER — Other Ambulatory Visit: Payer: Self-pay

## 2019-08-29 ENCOUNTER — Encounter: Payer: Self-pay | Admitting: Physical Therapy

## 2019-08-29 DIAGNOSIS — R262 Difficulty in walking, not elsewhere classified: Secondary | ICD-10-CM | POA: Diagnosis not present

## 2019-08-29 DIAGNOSIS — M6281 Muscle weakness (generalized): Secondary | ICD-10-CM

## 2019-08-29 DIAGNOSIS — M25572 Pain in left ankle and joints of left foot: Secondary | ICD-10-CM

## 2019-08-29 NOTE — Therapy (Signed)
[place], [place], [address] Phone: [phone]   Fax:  [phone]  Physical Therapy Treatment  Patient Details  Name: Cynthia Little MRN: [id number] Date of Birth: 06-22-1991 Referring Provider (PT): Dr. March Rummage    Encounter Date: 08/29/2019  PT End of Session - 08/29/19 1507    Visit Number  9    Number of Visits  15    Date for PT Re-Evaluation  09/29/19    Authorization Type  [place] - 15 visits approved    PT Start Time  1500    PT Stop Time  1550    PT Time Calculation (min)  50 min    Activity Tolerance  Patient tolerated treatment well    Behavior During Therapy  [facility] for tasks assessed/performed       Past Medical History:  Diagnosis Date  . Anemia   . Asthma   . Bronchitis     Past Surgical History:  Procedure Laterality Date  . CALCANEAL OSTEOTOMY Left 05/17/2019   Procedure: CALCANEAL OSTECTOMY;  Surgeon: Evelina Bucy, DPM;  Location: [place];  Service: Podiatry;  Laterality: Left;  . CESAREAN SECTION  08/11/2012   Procedure: CESAREAN SECTION;  Surgeon: Melina Schools, MD;  Location: [facility];  Service: Gynecology;  Laterality: N/A;  . CHEILECTOMY Left 05/17/2019   Procedure: CHEILECTOMY LEFT FOOT;  Surgeon: Evelina Bucy, DPM;  Location: [place];  Service: Podiatry;  Laterality: Left;  Marland Kitchen GASTROC RECESSION EXTREMITY Left 05/17/2019   Procedure: GASTROCNEMIUS RECESS;  Surgeon: Evelina Bucy, DPM;  Location: [place];  Service: Podiatry;  Laterality: Left;  . HAMMER TOE SURGERY Left 05/17/2019   Procedure: HAMMER TOE CORRECTION FIFTH;  Surgeon: Evelina Bucy, DPM;  Location: [place];  Service: Podiatry;  Laterality: Left;  . MINOR CLOSED MANIPULATION Left 05/17/2019   Procedure: MINOR CLOSED MANIPULATION;  Surgeon: Evelina Bucy, DPM;  Location: [place];  Service: Podiatry;  Laterality: Left;  .  NO PAST SURGERIES      There were no vitals filed for this visit.  Subjective Assessment - 08/29/19 1503    Subjective  I stood on one foot by itself this AM to get dressed. New sneakers are working great.  Heel is sore.    Currently in Pain?  Yes    Pain Score  2            [facility] Adult PT Treatment/Exercise - 08/29/19 0001      Knee/Hip Exercises: Standing   Heel Raises  Left;Both;3 sets;10 reps    Heel Raises Limitations  heels in, out and parallel       Ankle Exercises: Stretches   Slant Board Stretch  3 reps;30 seconds      Ankle Exercises: Standing   Warrior I  tandem stance on foam     Other Standing Ankle Exercises  foam pad, squats and high march       Ankle Exercises: Seated   Ankle Circles/Pumps  AROM;Left   into blue band               PT Short Term Goals - 08/25/19 0924      PT SHORT TERM GOAL #1   Title  independent with initial HEP for L ankle AROM, strength    Status  Achieved      PT SHORT TERM GOAL #2   Title  Pt will be able  to stand, walk in her home with pain min overall, least restrictive asst device as MD allows    Status  Achieved      PT SHORT TERM GOAL #3   Title  Pt will be able to perform SLS exercises on LLE, min dynamic for 30 sec to work toward full ankle function    Status  On-going      PT SHORT TERM GOAL #4   Title  Pt will note less swelling in LLE with normal daily activities    Status  Achieved        PT Long Term Goals - 08/29/19 1521      PT LONG TERM GOAL #1   Title  Pt will improve FOTO score to less than 35% impaired    Baseline  60%      PT LONG TERM GOAL #2   Title  Pt will be able to squat with no pain (min tension) in LLE for fitness, functional mobility    Baseline  pulling    Status  On-going      PT LONG TERM GOAL #3   Title  report no difficulty with stairs and walking as needed in the community    Baseline  pain going down, tension    Status  On-going      PT LONG TERM GOAL #4   Title   independent with advanced HEP for LE    Status  On-going            Plan - 08/29/19 1543    Clinical Impression Statement  Patient is improving a great deal, FOTO does not reflect.  She cont to limp wit gait and show decreased heels strike, ankle flexibility.  Worked on balance a bit today, iproved SLS on LLE, even on foam pad.    PT Treatment/Interventions  ADLs/Self Care Home Management;Therapeutic activities;Patient/family education;Taping;Therapeutic exercise;Cryotherapy;Ultrasound;Moist Heat;Gait training;Stair training;Functional mobility training;Neuromuscular re-education;Manual techniques;Passive range of motion;Scar mobilization;Vasopneumatic Device    PT Next Visit Plan  ankle/foot ROM, manual , T-band, GAIT, SLS    PT Home Exercise Plan  ankle ROM, towel scrunch, eversion, seated PF, partial lunge, SLS    Consulted and Agree with Plan of Care  Patient       Patient will benefit from skilled therapeutic intervention in order to improve the following deficits and impairments:  Abnormal gait, Decreased activity tolerance, Decreased balance, Difficulty walking, Increased edema, Impaired flexibility, Decreased strength, Decreased range of motion, Hypomobility, Postural dysfunction, Decreased scar mobility, Increased fascial restricitons, Pain, Impaired sensation  Visit Diagnosis: Difficulty in walking, not elsewhere classified  Pain in left ankle and joints of left foot  Muscle weakness (generalized)     Problem List There are no active problems to display for this patient.   PAA,JENNIFER 08/29/2019, 3:49 PM  [facility] [address] [place], [place], [address] Phone: [phone]   Fax:  [phone]  Name: Cynthia Little MRN: [id number] Date of Birth: February 04, 1991

## 2019-08-31 ENCOUNTER — Ambulatory Visit: Payer: No Typology Code available for payment source | Admitting: Physical Therapy

## 2019-08-31 ENCOUNTER — Other Ambulatory Visit: Payer: Self-pay

## 2019-08-31 ENCOUNTER — Encounter: Payer: Self-pay | Admitting: Physical Therapy

## 2019-08-31 DIAGNOSIS — M6281 Muscle weakness (generalized): Secondary | ICD-10-CM

## 2019-08-31 DIAGNOSIS — R262 Difficulty in walking, not elsewhere classified: Secondary | ICD-10-CM | POA: Diagnosis not present

## 2019-08-31 DIAGNOSIS — M25572 Pain in left ankle and joints of left foot: Secondary | ICD-10-CM

## 2019-08-31 NOTE — Therapy (Signed)
[place], [place], [address] Phone: [phone]   Fax:  [phone]  Physical Therapy Treatment  Patient Details  Name: Cynthia Little MRN: [id number] Date of Birth: 30-Nov-1991 Referring Provider (PT): Dr. March Rummage    Encounter Date: 08/31/2019  PT End of Session - 08/31/19 1501    Visit Number  10    Number of Visits  15    Date for PT Re-Evaluation  09/29/19    Authorization Type  VA - 15 visits approved    PT Start Time  1456   pt late 10 min   PT Stop Time  1545    PT Time Calculation (min)  49 min    Activity Tolerance  Patient tolerated treatment well    Behavior During Therapy  [facility] for tasks assessed/performed       Past Medical History:  Diagnosis Date  . Anemia   . Asthma   . Bronchitis     Past Surgical History:  Procedure Laterality Date  . CALCANEAL OSTEOTOMY Left 05/17/2019   Procedure: CALCANEAL OSTECTOMY;  Surgeon: Evelina Bucy, DPM;  Location: [place];  Service: Podiatry;  Laterality: Left;  . CESAREAN SECTION  08/11/2012   Procedure: CESAREAN SECTION;  Surgeon: Melina Schools, MD;  Location: [facility];  Service: Gynecology;  Laterality: N/A;  . CHEILECTOMY Left 05/17/2019   Procedure: CHEILECTOMY LEFT FOOT;  Surgeon: Evelina Bucy, DPM;  Location: [facility];  Service: Podiatry;  Laterality: Left;  Marland Kitchen GASTROC RECESSION EXTREMITY Left 05/17/2019   Procedure: GASTROCNEMIUS RECESS;  Surgeon: Evelina Bucy, DPM;  Location: [place];  Service: Podiatry;  Laterality: Left;  . HAMMER TOE SURGERY Left 05/17/2019   Procedure: HAMMER TOE CORRECTION FIFTH;  Surgeon: Evelina Bucy, DPM;  Location: [facility];  Service: Podiatry;  Laterality: Left;  . MINOR CLOSED MANIPULATION Left 05/17/2019   Procedure: MINOR CLOSED MANIPULATION;  Surgeon: Evelina Bucy, DPM;  Location: [place];  Service: Podiatry;   Laterality: Left;  . NO PAST SURGERIES      There were no vitals filed for this visit.  Subjective Assessment - 08/31/19 1459    Subjective  Sore in the  heel.  I may need to wear my compresison stockings more.    Currently in Pain?  Yes    Pain Score  2     Pain Location  Heel    Pain Orientation  Left    Pain Descriptors / Indicators  Sore    Pain Type  Surgical pain    Pain Onset  More than a month ago    Pain Frequency  Intermittent         [facility] Adult PT Treatment/Exercise - 08/31/19 0001      Knee/Hip Exercises: Standing   Side Lunges  Left;1 set;10 reps    Side Lunges Limitations  2 plates on Freemotion     SLS  static with therband UE row, flexion , very challenging but better than previous     Walking with Sports Cord  Freemotions 4 plates FW and back       Vasopneumatic   Number Minutes Vasopneumatic   15 minutes    Vasopnuematic Location   Ankle    Vasopneumatic Pressure  Low    Vasopneumatic Temperature   32 deg      Ankle Exercises: Aerobic   Elliptical  5 min L 10 ramp, L1 resistance  Ankle Exercises: Stretches   Slant Board Stretch  3 reps;30 seconds      Ankle Exercises: Standing   Heel Raises  Both;10 reps    Heel Walk (Round Trip)  used ball between ankles    Other Standing Ankle Exercises  weight shifts on step and then BOSU, focus on creating and maintaining an arch                PT Short Term Goals - 08/25/19 0924      PT SHORT TERM GOAL #1   Title  independent with initial HEP for L ankle AROM, strength    Status  Achieved      PT SHORT TERM GOAL #2   Title  Pt will be able to stand, walk in her home with pain min overall, least restrictive asst device as MD allows    Status  Achieved      PT SHORT TERM GOAL #3   Title  Pt will be able to perform SLS exercises on LLE, min dynamic for 30 sec to work toward full ankle function    Status  On-going      PT SHORT TERM GOAL #4   Title  Pt will note less swelling in LLE with  normal daily activities    Status  Achieved        PT Long Term Goals - 08/29/19 1521      PT LONG TERM GOAL #1   Title  Pt will improve FOTO score to less than 35% impaired    Baseline  60%      PT LONG TERM GOAL #2   Title  Pt will be able to squat with no pain (min tension) in LLE for fitness, functional mobility    Baseline  pulling    Status  On-going      PT LONG TERM GOAL #3   Title  report no difficulty with stairs and walking as needed in the community    Baseline  pain going down, tension    Status  On-going      PT LONG TERM GOAL #4   Title  independent with advanced HEP for LE    Status  On-going            Plan - 08/31/19 1605    Clinical Impression Statement  Pt late today, worked on standing, stability with and without shoes.  Improving although limp is significant.    PT Treatment/Interventions  ADLs/Self Care Home Management;Therapeutic activities;Patient/family education;Taping;Therapeutic exercise;Cryotherapy;Ultrasound;Moist Heat;Gait training;Stair training;Functional mobility training;Neuromuscular re-education;Manual techniques;Passive range of motion;Scar mobilization;Vasopneumatic Device    PT Next Visit Plan  ankle/foot ROM, manual , T-band, GAIT, SLS    PT Home Exercise Plan  ankle ROM, towel scrunch, eversion, seated PF, partial lunge, SLS    Consulted and Agree with Plan of Care  Patient       Patient will benefit from skilled therapeutic intervention in order to improve the following deficits and impairments:  Abnormal gait, Decreased activity tolerance, Decreased balance, Difficulty walking, Increased edema, Impaired flexibility, Decreased strength, Decreased range of motion, Hypomobility, Postural dysfunction, Decreased scar mobility, Increased fascial restricitons, Pain, Impaired sensation  Visit Diagnosis: Difficulty in walking, not elsewhere classified  Pain in left ankle and joints of left foot  Muscle weakness  (generalized)     Problem List There are no active problems to display for this patient.   Kerby Hockley 08/31/2019, 4:06 PM  [place]  [place], [place], [address] Phone: [phone]   Fax:  [phone]  Name: Cynthia Little MRN: [id number] Date of Birth: Jul 27, 1991  Raeford Razor, PT 08/31/19 4:06 PM Phone: [phone] Fax: [phone]

## 2019-09-05 ENCOUNTER — Ambulatory Visit: Payer: No Typology Code available for payment source | Admitting: Physical Therapy

## 2019-09-05 ENCOUNTER — Other Ambulatory Visit: Payer: Self-pay

## 2019-09-05 ENCOUNTER — Encounter: Payer: Self-pay | Admitting: Physical Therapy

## 2019-09-05 DIAGNOSIS — R262 Difficulty in walking, not elsewhere classified: Secondary | ICD-10-CM

## 2019-09-05 DIAGNOSIS — M25572 Pain in left ankle and joints of left foot: Secondary | ICD-10-CM

## 2019-09-05 DIAGNOSIS — M6281 Muscle weakness (generalized): Secondary | ICD-10-CM

## 2019-09-05 NOTE — Therapy (Signed)
[place], [place], [address] Phone: [phone]   Fax:  [phone]  Physical Therapy Treatment  Patient Details  Name: Cynthia Little MRN: [id number] Date of Birth: 09/21/1991 Referring Provider (PT): Dr. March Rummage    Encounter Date: 09/05/2019  PT End of Session - 09/05/19 1457    Visit Number  11    Number of Visits  15    Date for PT Re-Evaluation  09/29/19    Authorization Type  VA - 15 visits approved    PT Start Time  6384    PT Stop Time  1537    PT Time Calculation (min)  44 min    Activity Tolerance  Patient tolerated treatment well    Behavior During Therapy  [facility] for tasks assessed/performed       Past Medical History:  Diagnosis Date  . Anemia   . Asthma   . Bronchitis     Past Surgical History:  Procedure Laterality Date  . CALCANEAL OSTEOTOMY Left 05/17/2019   Procedure: CALCANEAL OSTECTOMY;  Surgeon: Evelina Bucy, DPM;  Location: [facility];  Service: Podiatry;  Laterality: Left;  . CESAREAN SECTION  08/11/2012   Procedure: CESAREAN SECTION;  Surgeon: Melina Schools, MD;  Location: [facility];  Service: Gynecology;  Laterality: N/A;  . CHEILECTOMY Left 05/17/2019   Procedure: CHEILECTOMY LEFT FOOT;  Surgeon: Evelina Bucy, DPM;  Location: [facility];  Service: Podiatry;  Laterality: Left;  Marland Kitchen GASTROC RECESSION EXTREMITY Left 05/17/2019   Procedure: GASTROCNEMIUS RECESS;  Surgeon: Evelina Bucy, DPM;  Location: [facility];  Service: Podiatry;  Laterality: Left;  . HAMMER TOE SURGERY Left 05/17/2019   Procedure: HAMMER TOE CORRECTION FIFTH;  Surgeon: Evelina Bucy, DPM;  Location: [facility];  Service: Podiatry;  Laterality: Left;  . MINOR CLOSED MANIPULATION Left 05/17/2019   Procedure: MINOR CLOSED MANIPULATION;  Surgeon: Evelina Bucy, DPM;  Location: [facility] [place];  Service: Podiatry;  Laterality: Left;   . NO PAST SURGERIES      There were no vitals filed for this visit.  Subjective Assessment - 09/05/19 1455    Subjective  Still sore.    Currently in Pain?  Yes    Pain Score  2     Pain Location  Heel    Pain Orientation  Left    Pain Descriptors / Indicators  Sore    Pain Type  Surgical pain    Pain Onset  More than a month ago    Pain Frequency  Intermittent         [facility] Adult PT Treatment/Exercise - 09/05/19 0001      Knee/Hip Exercises: Machines for Strengthening   Cybex Knee Extension       Cybex Leg Press  3 plates wide knees x 20, parallel x 20    Other Machine  heel raises 1 plate x 15    unable to do unilateral LLE      Cryotherapy   Number Minutes Cryotherapy  6 Minutes    Cryotherapy Location  Ankle    Type of Cryotherapy  Ice pack      Manual Therapy   Joint Mobilization  met heads, Gr toe extension PROM     Soft tissue mobilization  scar tissue , gastroc     Passive ROM  ankle DF, PF, Inv and Eversion       Ankle Exercises: Seated  Heel Raises  Left;20 reps    Other Seated Ankle Exercises  seated heel raise with 5 lbs and then 10 lbs    T band PF and Eversion blue band x 20 each               PT Short Term Goals - 08/25/19 1749      PT SHORT TERM GOAL #1   Title  independent with initial HEP for L ankle AROM, strength    Status  Achieved      PT SHORT TERM GOAL #2   Title  Pt will be able to stand, walk in her home with pain min overall, least restrictive asst device as MD allows    Status  Achieved      PT SHORT TERM GOAL #3   Title  Pt will be able to perform SLS exercises on LLE, min dynamic for 30 sec to work toward full ankle function    Status  On-going      PT SHORT TERM GOAL #4   Title  Pt will note less swelling in LLE with normal daily activities    Status  Achieved        PT Long Term Goals - 08/29/19 1521      PT LONG TERM GOAL #1   Title  Pt will improve FOTO score to less than 35% impaired    Baseline  60%       PT LONG TERM GOAL #2   Title  Pt will be able to squat with no pain (min tension) in LLE for fitness, functional mobility    Baseline  pulling    Status  On-going      PT LONG TERM GOAL #3   Title  report no difficulty with stairs and walking as needed in the community    Baseline  pain going down, tension    Status  On-going      PT LONG TERM GOAL #4   Title  independent with advanced HEP for LE    Status  On-going            Plan - 09/05/19 1649    Clinical Impression Statement  Pt with increased "rawness" along scar and soreness in heel. Worked on sitting PF with weight to work towards standing ability to improve gait. Applied cold pack directly to achilles in order to improve inflammation and reduce pain.    PT Treatment/Interventions  ADLs/Self Care Home Management;Therapeutic activities;Patient/family education;Taping;Therapeutic exercise;Cryotherapy;Ultrasound;Moist Heat;Gait training;Stair training;Functional mobility training;Neuromuscular re-education;Manual techniques;Passive range of motion;Scar mobilization;Vasopneumatic Device    PT Next Visit Plan  try [place] along scar , ankle/foot ROM, manual ,  GAIT, SLS, standing heel raises    PT Home Exercise Plan  ankle ROM, towel scrunch, eversion, seated PF, partial lunge, SLS    Consulted and Agree with Plan of Care  Patient       Patient will benefit from skilled therapeutic intervention in order to improve the following deficits and impairments:  Abnormal gait, Decreased activity tolerance, Decreased balance, Difficulty walking, Increased edema, Impaired flexibility, Decreased strength, Decreased range of motion, Hypomobility, Postural dysfunction, Decreased scar mobility, Increased fascial restricitons, Pain, Impaired sensation  Visit Diagnosis: Difficulty in walking, not elsewhere classified  Pain in left ankle and joints of left foot  Muscle weakness (generalized)     Problem List There are no active problems  to display for this patient.   Averill Pons 09/05/2019, 4:52 PM  [facility]  [place], [place], [address] Phone: [phone]   Fax:  [phone]  Name: Usha Slager MRN: [id number] Date of Birth: [birth year]  Raeford Razor, PT 09/05/19 4:52 PM Phone: [phone] Fax: [phone]

## 2019-09-07 ENCOUNTER — Encounter: Payer: Self-pay | Admitting: Physical Therapy

## 2019-09-07 ENCOUNTER — Ambulatory Visit: Payer: No Typology Code available for payment source | Admitting: Physical Therapy

## 2019-09-07 ENCOUNTER — Other Ambulatory Visit: Payer: Self-pay

## 2019-09-07 DIAGNOSIS — M6281 Muscle weakness (generalized): Secondary | ICD-10-CM

## 2019-09-07 DIAGNOSIS — R262 Difficulty in walking, not elsewhere classified: Secondary | ICD-10-CM

## 2019-09-07 DIAGNOSIS — M25572 Pain in left ankle and joints of left foot: Secondary | ICD-10-CM

## 2019-09-07 NOTE — Therapy (Signed)
[place], [place], [address] Phone: [phone]   Fax:  [phone]  Physical Therapy Treatment  Patient Details  Name: Cynthia Little MRN: [id number] Date of Birth: [birth year] Referring Provider (PT): Dr. March Rummage    Encounter Date: 09/07/2019  PT End of Session - 09/07/19 1519    Visit Number  12    Number of Visits  15    Date for PT Re-Evaluation  09/29/19    Authorization Type  VA - 15 visits approved    PT Start Time  1450    PT Stop Time  1530    PT Time Calculation (min)  40 min    Activity Tolerance  Patient tolerated treatment well    Behavior During Therapy  [facility] for tasks assessed/performed       Past Medical History:  Diagnosis Date  . Anemia   . Asthma   . Bronchitis     Past Surgical History:  Procedure Laterality Date  . CALCANEAL OSTEOTOMY Left 05/17/2019   Procedure: CALCANEAL OSTECTOMY;  Surgeon: Evelina Bucy, DPM;  Location: [place];  Service: Podiatry;  Laterality: Left;  . CESAREAN SECTION  08/11/2012   Procedure: CESAREAN SECTION;  Surgeon: Melina Schools, MD;  Location: [facility];  Service: Gynecology;  Laterality: N/A;  . CHEILECTOMY Left 05/17/2019   Procedure: CHEILECTOMY LEFT FOOT;  Surgeon: Evelina Bucy, DPM;  Location: [place];  Service: Podiatry;  Laterality: Left;  Marland Kitchen GASTROC RECESSION EXTREMITY Left 05/17/2019   Procedure: GASTROCNEMIUS RECESS;  Surgeon: Evelina Bucy, DPM;  Location: [place];  Service: Podiatry;  Laterality: Left;  . HAMMER TOE SURGERY Left 05/17/2019   Procedure: HAMMER TOE CORRECTION FIFTH;  Surgeon: Evelina Bucy, DPM;  [place] [facility] [facility];  Service: Podiatry;  Laterality: Left;  . MINOR CLOSED MANIPULATION Left 05/17/2019   Procedure: MINOR CLOSED MANIPULATION;  Surgeon: Evelina Bucy, DPM;  Location: [facility];  Service: Podiatry;  Laterality: Left;   . NO PAST SURGERIES      There were no vitals filed for this visit.  Subjective Assessment - 09/07/19 1453    Subjective  Sore, 6/10 heel.  I can telll it it tight when I wake up in the middle of night and try to move my ankle.    Currently in Pain?  Yes    Pain Score  6     Pain Location  Heel    Pain Orientation  Left    Pain Descriptors / Indicators  Sore    Pain Type  Surgical pain    Pain Onset  More than a month ago    Pain Frequency  Intermittent    Aggravating Factors   stretching, middle of the night    Pain Relieving Factors  RICE , massage         [facility] Adult PT Treatment/Exercise - 09/07/19 0001      Knee/Hip Exercises: Stretches   Gastroc Stretch  Left;3 reps    Soleus Stretch  Left;3 reps      Knee/Hip Exercises: Aerobic   Tread Mill  worked on gait pattern, heel strike , 1.4 mph, 0-5 % grade    8 min      Knee/Hip Exercises: Standing   Forward Lunges  Left;1 set;5 reps    Forward Lunges Limitations  10 sec on foam     Forward Step Up  Left;1 set;20 reps;Hand Hold: 1;Step  Height: 8"    Other Standing Knee Exercises  single leg hip hinge x 10 with UE assist       Ultrasound   Ultrasound Location  L achilles and heel, plantar aspect   used biofreeze    Ultrasound Parameters  1.2 W/cm2, 100% duty, 8 min     Ultrasound Goals  Pain      Manual Therapy   Passive ROM  ankle DF, PF, Inv and Eversion                PT Short Term Goals - 08/25/19 [id number]      PT SHORT TERM GOAL #1   Title  independent with initial HEP for L ankle AROM, strength    Status  Achieved      PT SHORT TERM GOAL #2   Title  Pt will be able to stand, walk in her home with pain min overall, least restrictive asst device as MD allows    Status  Achieved      PT SHORT TERM GOAL #3   Title  Pt will be able to perform SLS exercises on LLE, min dynamic for 30 sec to work toward full ankle function    Status  On-going      PT SHORT TERM GOAL #4   Title  Pt will note less  swelling in LLE with normal daily activities    Status  Achieved        PT Long Term Goals - 08/29/19 1521      PT LONG TERM GOAL #1   Title  Pt will improve FOTO score to less than 35% impaired    Baseline  60%      PT LONG TERM GOAL #2   Title  Pt will be able to squat with no pain (min tension) in LLE for fitness, functional mobility    Baseline  pulling    Status  On-going      PT LONG TERM GOAL #3   Title  report no difficulty with stairs and walking as needed in the community    Baseline  pain going down, tension    Status  On-going      PT LONG TERM GOAL #4   Title  independent with advanced HEP for LE    Status  On-going            Plan - 09/07/19 1541    Clinical Impression Statement  Patient continues to heel stiffness especially in ankle when moving after being still a longer period of time.  With increased arm swing she improved limp on the treadmill.  Reports greater ease with stairs.  Progressing towards goals.    PT Treatment/Interventions  ADLs/Self Care Home Management;Therapeutic activities;Patient/family education;Taping;Therapeutic exercise;Cryotherapy;Ultrasound;Moist Heat;Gait training;Stair training;Functional mobility training;Neuromuscular re-education;Manual techniques;Passive range of motion;Scar mobilization;Vasopneumatic Device    PT Next Visit Plan  repeat US?  ankle/foot ROM, manual ,  GAIT, SLS, standing heel raises    PT Home Exercise Plan  ankle ROM, towel scrunch, eversion, seated PF, partial lunge, SLS    Consulted and Agree with Plan of Care  Patient       Patient will benefit from skilled therapeutic intervention in order to improve the following deficits and impairments:  Abnormal gait, Decreased activity tolerance, Decreased balance, Difficulty walking, Increased edema, Impaired flexibility, Decreased strength, Decreased range of motion, Hypomobility, Postural dysfunction, Decreased scar mobility, Increased fascial restricitons, Pain,  Impaired sensation  Visit Diagnosis: Difficulty in walking, not elsewhere classified  Pain in left ankle and joints of left foot  Muscle weakness (generalized)     Problem List There are no active problems to display for this patient.   Cynthia Little 09/07/2019, 3:48 PM  [facility] [address] [place], [place], [address] Phone: [phone]   Fax:  [phone]  Name: Lennea Rumford MRN: [id number] Date of Birth: January 10, 1991  Raeford Razor, PT 09/07/19 3:48 PM Phone: [phone] Fax: [phone]

## 2019-09-11 ENCOUNTER — Ambulatory Visit: Payer: No Typology Code available for payment source | Admitting: Physical Therapy

## 2019-09-11 ENCOUNTER — Other Ambulatory Visit: Payer: Self-pay

## 2019-09-11 ENCOUNTER — Encounter: Payer: Self-pay | Admitting: Physical Therapy

## 2019-09-11 DIAGNOSIS — M6281 Muscle weakness (generalized): Secondary | ICD-10-CM

## 2019-09-11 DIAGNOSIS — R262 Difficulty in walking, not elsewhere classified: Secondary | ICD-10-CM | POA: Diagnosis not present

## 2019-09-11 DIAGNOSIS — M25572 Pain in left ankle and joints of left foot: Secondary | ICD-10-CM

## 2019-09-11 NOTE — Therapy (Signed)
Cynthia Little, [place], [address] Phone: [phone]   Fax:  [phone]  Physical Therapy Treatment  Patient Details  Name: Cynthia Little MRN: [id number] Date of Birth: 01-19-1991 Referring Provider (PT): Dr. March Rummage    Encounter Date: 09/11/2019  PT End of Session - 09/11/19 1503    Visit Number  13    Number of Visits  15    Date for PT Re-Evaluation  09/29/19    Authorization Type  [place] - 15 visits approved    PT Start Time  1455    PT Stop Time  1545    PT Time Calculation (min)  50 min    Activity Tolerance  Patient tolerated treatment well    Behavior During Therapy  [facility] for tasks assessed/performed       Past Medical History:  Diagnosis Date  . Anemia   . Asthma   . Bronchitis     Past Surgical History:  Procedure Laterality Date  . CALCANEAL OSTEOTOMY Left 05/17/2019   Procedure: CALCANEAL OSTECTOMY;  Surgeon: Evelina Bucy, DPM;  Location: [place];  Service: Podiatry;  Laterality: Left;  . CESAREAN SECTION  08/11/2012   Procedure: CESAREAN SECTION;  Surgeon: Melina Schools, MD;  Location: [facility];  Service: Gynecology;  Laterality: N/A;  . CHEILECTOMY Left 05/17/2019   Procedure: CHEILECTOMY LEFT FOOT;  Surgeon: Evelina Bucy, DPM;  Location: [place];  Service: Podiatry;  Laterality: Left;  Marland Kitchen GASTROC RECESSION EXTREMITY Left 05/17/2019   Procedure: GASTROCNEMIUS RECESS;  Surgeon: Evelina Bucy, DPM;  Location: [place];  Service: Podiatry;  Laterality: Left;  . HAMMER TOE SURGERY Left 05/17/2019   Procedure: HAMMER TOE CORRECTION FIFTH;  Surgeon: Evelina Bucy, DPM;  Location: [place];  Service: Podiatry;  Laterality: Left;  . MINOR CLOSED MANIPULATION Left 05/17/2019   Procedure: MINOR CLOSED MANIPULATION;  Surgeon: Evelina Bucy, DPM;  Location: [place];  Service: Podiatry;  Laterality: Left;   . NO PAST SURGERIES      There were no vitals filed for this visit.  Subjective Assessment - 09/11/19 1457    Subjective  Heel is  a little sore but its oK.  New sneakers.    Currently in Pain?  Yes    Pain Score  4     Pain Location  Heel    Pain Orientation  Left    Pain Descriptors / Indicators  Sore    Pain Type  Surgical pain    Pain Onset  More than a month ago    Pain Frequency  Intermittent    Aggravating Factors   pain at night, overdoing it    Pain Relieving Factors  massage, stretching it            [facility] Adult PT Treatment/Exercise - 09/11/19 0001      Knee/Hip Exercises: Aerobic   Elliptical  6 min L 1 resist, 10 ramp       Knee/Hip Exercises: Standing   Forward Step Up  Left;1 set;20 reps;Hand Hold: 0;Step Height: 6"    Step Down  Left;2 sets;15 reps;Hand Hold: 1;Step Height: 4"    Functional Squat Limitations  using 6 inch step 2 x 10 each leg     Wall Squat  1 set;10 reps    Wall Squat Limitations  plie squat x 10     Other Standing Knee Exercises  wide "horse pose"  squat alternating heel lift x 10     Other Standing Knee Exercises  wall for LE: mini squat with heel lifts       Vasopneumatic   Number Minutes Vasopneumatic   15 minutes    Vasopnuematic Location   Ankle    Vasopneumatic Pressure  Medium    Vasopneumatic Temperature   32 deg       Manual Therapy   Joint Mobilization  met heads    Soft tissue mobilization  calf, base of heel and Achilles     Passive ROM  ankle DF, PF, Inv and Eversion       Ankle Exercises: Standing   SLS  foam pad, added small ROM hip hinge with UE assist      Ankle Exercises: Stretches   Gastroc Stretch  3 reps;30 seconds    Other Stretch  at wall                PT Short Term Goals - 09/11/19 1505      PT SHORT TERM GOAL #1   Title  independent with initial HEP for L ankle AROM, strength    Status  Achieved      PT SHORT TERM GOAL #2   Title  Pt will be able to stand, walk in her home with pain  min overall, least restrictive asst device as MD allows    Status  Achieved      PT SHORT TERM GOAL #3   Title  Pt will be able to perform SLS exercises on LLE, min dynamic for 30 sec to work toward full ankle function    Status  On-going      PT SHORT TERM GOAL #4   Title  Pt will note less swelling in LLE with normal daily activities    Status  Achieved        PT Long Term Goals - 09/11/19 1503      PT LONG TERM GOAL #1   Title  Pt will improve FOTO score to less than 35% impaired    Status  On-going      PT LONG TERM GOAL #2   Title  Pt will be able to squat with no pain (min tension) in LLE for fitness, functional mobility    Status  On-going      PT LONG TERM GOAL #3   Title  report no difficulty with stairs and walking as needed in the community    Status  On-going      PT LONG TERM GOAL #4   Title  independent with advanced HEP for LE    Status  On-going            Plan - 09/11/19 1718    Clinical Impression Statement  Pt is progressing towards goals.  She was on time today but I was running late.  Cont to need work on L ankle ROM, knee control and proper gait. She may need 8 more visits , she plans to ask MD for more visits approved through the [place].  She did not start PT immediately after surgery, may be a biut delayed in progression.    PT Treatment/Interventions  ADLs/Self Care Home Management;Therapeutic activities;Patient/family education;Taping;Therapeutic exercise;Cryotherapy;Ultrasound;Moist Heat;Gait training;Stair training;Functional mobility training;Neuromuscular re-education;Manual techniques;Passive range of motion;Scar mobilization;Vasopneumatic Device    PT Next Visit Plan  repeat US?  ankle/foot ROM, manual ,  GAIT, SLS, standing heel raises    PT Home Exercise Plan  ankle ROM, towel scrunch,  eversion, seated PF, partial lunge, SLS    Consulted and Agree with Plan of Care  Patient       Patient will benefit from skilled therapeutic intervention  in order to improve the following deficits and impairments:  Abnormal gait, Decreased activity tolerance, Decreased balance, Difficulty walking, Increased edema, Impaired flexibility, Decreased strength, Decreased range of motion, Hypomobility, Postural dysfunction, Decreased scar mobility, Increased fascial restricitons, Pain, Impaired sensation  Visit Diagnosis: Difficulty in walking, not elsewhere classified  Muscle weakness (generalized)  Pain in left ankle and joints of left foot     Problem List There are no active problems to display for this patient.   PAA,JENNIFER 09/11/2019, 5:21 PM  [facility] [address] [place], [place], [address] Phone: [phone]   Fax:  [phone]  Name: Jalina Blowers MRN: [id number] Date of Birth: 11/18/1991  Raeford Razor, PT 09/11/19 5:22 PM Phone: [phone] Fax: [phone]

## 2019-09-13 ENCOUNTER — Other Ambulatory Visit: Payer: Self-pay

## 2019-09-13 ENCOUNTER — Ambulatory Visit: Payer: No Typology Code available for payment source | Admitting: Physical Therapy

## 2019-09-13 DIAGNOSIS — R262 Difficulty in walking, not elsewhere classified: Secondary | ICD-10-CM

## 2019-09-13 DIAGNOSIS — M25572 Pain in left ankle and joints of left foot: Secondary | ICD-10-CM

## 2019-09-13 DIAGNOSIS — M6281 Muscle weakness (generalized): Secondary | ICD-10-CM

## 2019-09-13 NOTE — Addendum Note (Signed)
Addended by: Raeford Razor L on: 09/13/2019 04:16 PM   Modules accepted: Orders

## 2019-09-13 NOTE — Therapy (Signed)
[place], [place], [address] Phone: [phone]   Fax:  [phone]  Physical Therapy Treatment  Patient Details  Name: Cynthia Little MRN: [id number] Date of Birth: 08-31-1991 Referring Provider (PT): Dr. March Rummage    Encounter Date: 09/13/2019  PT End of Session - 09/13/19 1454    Visit Number  14    Number of Visits  15    Date for PT Re-Evaluation  09/29/19    Authorization Type  [place] - 15 visits approved    PT Start Time  2353    PT Stop Time  1530    PT Time Calculation (min)  42 min       Past Medical History:  Diagnosis Date  . Anemia   . Asthma   . Bronchitis     Past Surgical History:  Procedure Laterality Date  . CALCANEAL OSTEOTOMY Left 05/17/2019   Procedure: CALCANEAL OSTECTOMY;  Surgeon: Evelina Bucy, DPM;  Location: [place];  Service: Podiatry;  Laterality: Left;  . CESAREAN SECTION  08/11/2012   Procedure: CESAREAN SECTION;  Surgeon: Melina Schools, MD;  Location: [facility];  Service: Gynecology;  Laterality: N/A;  . CHEILECTOMY Left 05/17/2019   Procedure: CHEILECTOMY LEFT FOOT;  Surgeon: Evelina Bucy, DPM;  Location: [place];  Service: Podiatry;  Laterality: Left;  Marland Kitchen GASTROC RECESSION EXTREMITY Left 05/17/2019   Procedure: GASTROCNEMIUS RECESS;  Surgeon: Evelina Bucy, DPM;  Location: [place];  Service: Podiatry;  Laterality: Left;  . HAMMER TOE SURGERY Left 05/17/2019   Procedure: HAMMER TOE CORRECTION FIFTH;  Surgeon: Evelina Bucy, DPM;  Location: [place];  Service: Podiatry;  Laterality: Left;  . MINOR CLOSED MANIPULATION Left 05/17/2019   Procedure: MINOR CLOSED MANIPULATION;  Surgeon: Evelina Bucy, DPM;  Location: [place];  Service: Podiatry;  Laterality: Left;  . NO PAST SURGERIES      There were no vitals filed for this visit.      [facility] PT Assessment - 09/13/19 0001       Figure 8 Edema   Figure 8 - Left   21 inch       Single Leg Stance   Comments  < 10 sec on LLE       AROM   Left Ankle Dorsiflexion  12    Left Ankle Plantar Flexion  35    Left Ankle Inversion  40    Left Ankle Eversion  21      Strength   Left Ankle Dorsiflexion  4+/5    Left Ankle Plantar Flexion  4/5    Left Ankle Inversion  5/5    Left Ankle Eversion  4+/5        [facility] Adult PT Treatment/Exercise - 09/13/19 0001      Knee/Hip Exercises: Aerobic   Tread Mill  2.2 mph , 2-3 % grade       Knee/Hip Exercises: Standing   Other Standing Knee Exercises  TRX curtsy lunge x 10  and single leg squat x 10 each leg     Other Standing Knee Exercises  TRX squats, single leg balance, using arms to challenge       Ultrasound   Ultrasound Location  L achilees and lateral to for swelling     Ultrasound Parameters  1.2 W/cm2, 50%     Ultrasound Goals  Pain      Manual Therapy   Manual  Therapy  Taping    Kinesiotex  Edema;Create Space      Kinesiotix   Edema  lateral post ankle L     Create Space  50% tension across achilles , 1 strip      Ankle Exercises: Standing   Heel Raises  Both;10 reps    Heel Walk (Round Trip)  unable to do with LLE                PT Short Term Goals - 09/13/19 1602      PT SHORT TERM GOAL #1   Title  independent with initial HEP for L ankle AROM, strength    Status  Achieved      PT SHORT TERM GOAL #2   Title  Pt will be able to stand, walk in her home with pain min overall, least restrictive asst device as MD allows    Status  Achieved      PT SHORT TERM GOAL #3   Title  Pt will be able to perform SLS exercises on LLE, min dynamic for 30 sec to work toward full ankle function    Baseline  improving    Status  Partially Met      PT SHORT TERM GOAL #4   Title  Pt will note less swelling in LLE with normal daily activities    Baseline  mild swelling    Status  Partially Met        PT Long Term Goals - 09/13/19 1602       PT LONG TERM GOAL #1   Title  Pt will improve FOTO score to less than 35% impaired    Baseline  60%    Status  On-going      PT LONG TERM GOAL #2   Title  Pt will be able to squat with no pain (min tension) in LLE for fitness, functional mobility    Status  Achieved      PT LONG TERM GOAL #3   Title  report no difficulty with stairs and walking as needed in the community    Baseline  still tight with descent "doesnt want to bend knee"    Status  Partially Met      PT LONG TERM GOAL #4   Title  independent with advanced HEP for LE    Status  On-going            Plan - 09/13/19 1455    Clinical Impression Statement  Patient continues to be consistent in her attendance and effort both in and out of the clinic. She is still struggling with single leg balance and proprioception.  Ankle ROM improved but strength still limited. SHe has min localized swelling along lateral aspect of ankle, Achilles. She will benefit from continued PT and 8 more visits are requested.    Examination-Participation Restrictions  Laundry;Shop;Cleaning;Community Activity;Yard Work;Interpersonal Relationship    PT Treatment/Interventions  ADLs/Self Care Home Management;Therapeutic activities;Patient/family education;Taping;Therapeutic exercise;Cryotherapy;Ultrasound;Moist Heat;Gait training;Stair training;Functional mobility training;Neuromuscular re-education;Manual techniques;Passive range of motion;Scar mobilization;Vasopneumatic Device    PT Next Visit Plan  how was tape, asked for 8 more visits, are they approved? , cont SLS and proprioception,work on calf raise eccentric    PT Home Exercise Plan  ankle ROM, towel scrunch, eversion, seated PF, partial lunge, SLS    Consulted and Agree with Plan of Care  Patient       Patient will benefit from skilled therapeutic intervention in order to improve the following deficits and  impairments:  Abnormal gait, Decreased activity tolerance, Decreased balance, Difficulty  walking, Increased edema, Impaired flexibility, Decreased strength, Decreased range of motion, Hypomobility, Postural dysfunction, Decreased scar mobility, Increased fascial restricitons, Pain, Impaired sensation  Visit Diagnosis: Difficulty in walking, not elsewhere classified  Muscle weakness (generalized)  Pain in left ankle and joints of left foot     Problem List There are no active problems to display for this patient.   PAA,JENNIFER 09/13/2019, 4:10 PM  [place], [place], [address] Phone: [phone]   Fax:  [phone]  Name: Dawnette Mione MRN: [id number] Date of Birth: [birth year]  Raeford Razor, PT 09/13/19 4:11 PM Phone: [phone] Fax: [phone]

## 2019-09-14 ENCOUNTER — Ambulatory Visit (INDEPENDENT_AMBULATORY_CARE_PROVIDER_SITE_OTHER): Payer: Non-veteran care | Admitting: Podiatry

## 2019-09-14 ENCOUNTER — Ambulatory Visit: Payer: Non-veteran care

## 2019-09-14 DIAGNOSIS — M2042 Other hammer toe(s) (acquired), left foot: Secondary | ICD-10-CM

## 2019-09-14 MED ORDER — METHYLPREDNISOLONE 4 MG PO TBPK: ORAL_TABLET | ORAL | 0 refills | Status: DC

## 2019-09-14 MED ORDER — CYCLOBENZAPRINE HCL 5 MG PO TABS: 5.0000 mg | ORAL_TABLET | Freq: Every evening | ORAL | 1 refills | Status: DC | PRN

## 2019-09-15 ENCOUNTER — Ambulatory Visit: Payer: No Typology Code available for payment source | Admitting: Physical Therapy

## 2019-09-19 ENCOUNTER — Ambulatory Visit: Payer: No Typology Code available for payment source | Admitting: Physical Therapy

## 2019-09-19 ENCOUNTER — Other Ambulatory Visit: Payer: Self-pay

## 2019-09-19 ENCOUNTER — Encounter: Payer: Self-pay | Admitting: Physical Therapy

## 2019-09-19 DIAGNOSIS — M25572 Pain in left ankle and joints of left foot: Secondary | ICD-10-CM

## 2019-09-19 DIAGNOSIS — M6281 Muscle weakness (generalized): Secondary | ICD-10-CM

## 2019-09-19 DIAGNOSIS — R262 Difficulty in walking, not elsewhere classified: Secondary | ICD-10-CM

## 2019-09-19 NOTE — Progress Notes (Signed)
Patient ID: Cynthia Little, female   DOB: Mar 13, 1991, 29 y.o.   MRN: [id number]  Subjective:  Patient presents for followup, states that she is doing rather well, has some tightness in the calf.  She is working with physical therapy and thinks that it is helping.  Has some occasional soreness in the back of the heel at the end of the day.  Objective:   Constitutional Well developed. Well nourished.  Musculoskeletal: All incisions healed to the left foot. Left first metatarsophalangeal joint with good range of motion without pain to palpation. Left foot hammer toe incision healed without pain to palpation. Good Achilles strength. Ankle range of motion only to 0-5 degrees. No palpable dell at the Achilles tendon.   Radiographs:  None.  Assessment/Plan:  Patient was evaluated and treated and all questions answered.  1.  Status post cheilectomy of left foot, calcaneal osteotomy, heel spur resection, left foot hammertoe repair, gastrocnemius recession. - Overall, the patient is doing very well.  I do think that she had some stalling of her progress due to physical therapy being delayed.  I advised her to continue physical therapy.  I think that her symptoms will subside with time.  Continue weightbearing as tolerated, normal shoe gear.  Follow up in four weeks for recheck.

## 2019-09-19 NOTE — Therapy (Signed)
[place], [place], [address] Phone: [phone]   Fax:  [phone]  Physical Therapy Treatment  Patient Details  Name: Cynthia Little MRN: [id number] Date of Birth: [birth year] Referring Provider (PT): Dr. March Rummage    Encounter Date: 09/19/2019  PT End of Session - 09/19/19 1557    Visit Number  15    Number of Visits  15    Date for PT Re-Evaluation  09/29/19    Authorization Type  VA - 15 visits approved    PT Start Time  8299   late   PT Stop Time  1633    PT Time Calculation (min)  51 min    Activity Tolerance  Patient tolerated treatment well    Behavior During Therapy  [facility] for tasks assessed/performed       Past Medical History:  Diagnosis Date  . Anemia   . Asthma   . Bronchitis     Past Surgical History:  Procedure Laterality Date  . CALCANEAL OSTEOTOMY Left 05/17/2019   Procedure: CALCANEAL OSTECTOMY;  Surgeon: Evelina Bucy, DPM;  Location: [place];  Service: Podiatry;  Laterality: Left;  . CESAREAN SECTION  08/11/2012   Procedure: CESAREAN SECTION;  Surgeon: Melina Schools, MD;  Location: [facility];  Service: Gynecology;  Laterality: N/A;  . CHEILECTOMY Left 05/17/2019   Procedure: CHEILECTOMY LEFT FOOT;  Surgeon: Evelina Bucy, DPM;  Location: [place];  Service: Podiatry;  Laterality: Left;  Marland Kitchen GASTROC RECESSION EXTREMITY Left 05/17/2019   Procedure: GASTROCNEMIUS RECESS;  Surgeon: Evelina Bucy, DPM;  Location: [place];  Service: Podiatry;  Laterality: Left;  . HAMMER TOE SURGERY Left 05/17/2019   Procedure: HAMMER TOE CORRECTION FIFTH;  Surgeon: Evelina Bucy, DPM;  Location: [place];  Service: Podiatry;  Laterality: Left;  . MINOR CLOSED MANIPULATION Left 05/17/2019   Procedure: MINOR CLOSED MANIPULATION;  Surgeon: Evelina Bucy, DPM;  Location: [place];  Service: Podiatry;  Laterality:  Left;  . NO PAST SURGERIES      There were no vitals filed for this visit.  Subjective Assessment - 09/19/19 1543    Subjective  On steriod, muscle relaxer (not yet), and also used pads on her feet to draw out the toxins recommended by her sister.          [facility] Adult PT Treatment/Exercise - 09/19/19 0001      Pilates   Pilates Reformer  Reformer for Footwork: 2 Red 1 Blue see note       Vasopneumatic   Number Minutes Vasopneumatic   15 minutes    Vasopnuematic Location   Ankle    Vasopneumatic Pressure  Medium    Vasopneumatic Temperature   32 deg        Footwork double leg press out 2 Red 1 Blue   Heels, Arch and Toe  Heel raises x 20 in parallel, turnout   Single leg heel raises 2 x 10; 2 Red and then 2 Red 1 Blue   Sidelying 2 Red:  single leg press out for hip stability , heels, toes, parallel and    turnout   Running, stretches for heel/ankle 2 Red 1 blue       PT Education - 09/19/19 1627    Education Details  POC, last visit approved, midline with heel raises and big toe ext    Person(s) Educated  Patient    Methods  Explanation    Comprehension  Verbalized understanding       PT Short Term Goals - 09/13/19 1602      PT SHORT TERM GOAL #1   Title  independent with initial HEP for L ankle AROM, strength    Status  Achieved      PT SHORT TERM GOAL #2   Title  Pt will be able to stand, walk in her home with pain min overall, least restrictive asst device as MD allows    Status  Achieved      PT SHORT TERM GOAL #3   Title  Pt will be able to perform SLS exercises on LLE, min dynamic for 30 sec to work toward full ankle function    Baseline  improving    Status  Partially Met      PT SHORT TERM GOAL #4   Title  Pt will note less swelling in LLE with normal daily activities    Baseline  mild swelling    Status  Partially Met        PT Long Term Goals - 09/13/19 1602      PT LONG TERM GOAL #1   Title  Pt will improve FOTO score to less than 35%  impaired    Baseline  60%    Status  On-going      PT LONG TERM GOAL #2   Title  Pt will be able to squat with no pain (min tension) in LLE for fitness, functional mobility    Status  Achieved      PT LONG TERM GOAL #3   Title  report no difficulty with stairs and walking as needed in the community    Baseline  still tight with descent "doesnt want to bend knee"    Status  Partially Met      PT LONG TERM GOAL #4   Title  independent with advanced HEP for LE    Status  On-going            Plan - 09/19/19 1628    Clinical Impression Statement  Pt a bit late, has felt improvement in the past few days with the application of a OTC "Chinese" Pad to detox.  She was able to improve her plantarflexion ROM and strength when in the supine position on Reformer, even with single leg. She has not received approval for more visits, will follow up and keep next appt.    PT Treatment/Interventions  ADLs/Self Care Home Management;Therapeutic activities;Patient/family education;Taping;Therapeutic exercise;Cryotherapy;Ultrasound;Moist Heat;Gait training;Stair training;Functional mobility training;Neuromuscular re-education;Manual techniques;Passive range of motion;Scar mobilization;Vasopneumatic Device    PT Next Visit Plan  how was tape, asked for 8 more visits, are they approved? , cont SLS and proprioception,work on calf raise eccentric    PT Home Exercise Plan  ankle ROM, towel scrunch, eversion, seated PF, partial lunge, SLS    Consulted and Agree with Plan of Care  Patient       Patient will benefit from skilled therapeutic intervention in order to improve the following deficits and impairments:  Abnormal gait, Decreased activity tolerance, Decreased balance, Difficulty walking, Increased edema, Impaired flexibility, Decreased strength, Decreased range of motion, Hypomobility, Postural dysfunction, Decreased scar mobility, Increased fascial restricitons, Pain, Impaired sensation  Visit  Diagnosis: Difficulty in walking, not elsewhere classified  Muscle weakness (generalized)  Pain in left ankle and joints of left foot     Problem List There are no active problems to display for this patient.   PAA,JENNIFER  09/19/2019, 4:44 PM  [place], [place], [address] Phone: [phone]   Fax:  [phone]  Name: Cynthia Little MRN: [id number] Date of Birth: Jul 11, 1991  Raeford Razor, PT 09/19/19 4:44 PM Phone: [phone] Fax: [phone]

## 2019-09-22 ENCOUNTER — Ambulatory Visit: Payer: No Typology Code available for payment source | Admitting: Physical Therapy

## 2019-09-25 ENCOUNTER — Ambulatory Visit: Payer: No Typology Code available for payment source | Admitting: Physical Therapy

## 2019-10-06 ENCOUNTER — Other Ambulatory Visit: Payer: Self-pay

## 2019-10-06 DIAGNOSIS — Z20822 Contact with and (suspected) exposure to covid-19: Secondary | ICD-10-CM

## 2019-10-08 LAB — NOVEL CORONAVIRUS, NAA: SARS-CoV-2, NAA: NOT DETECTED

## 2019-10-11 ENCOUNTER — Ambulatory Visit: Payer: No Typology Code available for payment source | Admitting: Physical Therapy
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
## 2019-10-18 ENCOUNTER — Encounter: Payer: Self-pay | Admitting: Physical Therapy

## 2019-10-18 ENCOUNTER — Ambulatory Visit: Payer: No Typology Code available for payment source | Admitting: Physical Therapy

## 2019-10-26 ENCOUNTER — Other Ambulatory Visit: Payer: Self-pay

## 2019-10-26 ENCOUNTER — Ambulatory Visit (INDEPENDENT_AMBULATORY_CARE_PROVIDER_SITE_OTHER): Payer: No Typology Code available for payment source | Admitting: Podiatry

## 2019-10-26 DIAGNOSIS — M7662 Achilles tendinitis, left leg: Secondary | ICD-10-CM

## 2019-10-26 DIAGNOSIS — M2042 Other hammer toe(s) (acquired), left foot: Secondary | ICD-10-CM | POA: Diagnosis not present

## 2019-10-26 DIAGNOSIS — M205X2 Other deformities of toe(s) (acquired), left foot: Secondary | ICD-10-CM

## 2019-10-26 DIAGNOSIS — L910 Hypertrophic scar: Secondary | ICD-10-CM | POA: Diagnosis not present

## 2019-10-26 DIAGNOSIS — M7732 Calcaneal spur, left foot: Secondary | ICD-10-CM

## 2019-10-26 NOTE — Progress Notes (Signed)
Patient ID: Cynthia Little, female   DOB: [birth year], 30 y.o.   MRN: [id number]  Chief Complaint  Patient presents with  . Foot Pain    pt is here for a f/u on right foot pain, pt states that the right foot is not getting better, and is currently not doing physical therapy    Subjective:  Patient presents for followup, doing well still having some tightness and some occasional pains does have pain from the thickness of the scar to the back of the left calf Objective:   Constitutional Well developed. Well nourished.  Musculoskeletal: All incisions healed to the left foot.  Slight hypertrophic scar to the posterior aspect of the Achilles tendon Left first metatarsophalangeal joint with good range of motion without pain to palpation. Left foot hammer toe incision healed without pain to palpation. Good Achilles strength. Ankle range of motion only to 0-5 degrees. No palpable dell at the Achilles tendon.   Radiographs:  None.  Assessment/Plan:  Patient was evaluated and treated and all questions answered.  1.  Status post cheilectomy of left foot, calcaneal osteotomy, heel spur resection, left foot hammertoe repair, gastrocnemius recession. -Patient states that she is doing okay she is still having some pain and feeling of tightness I think should benefit from additional therapy however her therapy is being held up by approval.  I discussed with patient that I will be happy to help with approval but she thinks that is being caught up at the [place].  She does have some pain from the hypertrophic scar and we did inject the hypertrophic scar today.  See injection note below.  We will have patient follow-up in 6 weeks for recheck.  Procedure: Injection skin lesion Consent: Verbal consent obtained. Location: left hypertrophic scar at achilles tendon Skin Prep: Alcohol. Injectate: 1 cc 0.5% marcaine plain, 0.5 cc kenalog 40. Disposition: Patient tolerated procedure well. Injection site dressed with  a band-aid.

## 2019-11-09 ENCOUNTER — Ambulatory Visit: Payer: No Typology Code available for payment source | Attending: Podiatry | Admitting: Physical Therapy

## 2019-12-07 ENCOUNTER — Ambulatory Visit (INDEPENDENT_AMBULATORY_CARE_PROVIDER_SITE_OTHER): Payer: No Typology Code available for payment source | Admitting: Podiatry

## 2019-12-07 ENCOUNTER — Other Ambulatory Visit: Payer: Self-pay

## 2019-12-07 DIAGNOSIS — M7732 Calcaneal spur, left foot: Secondary | ICD-10-CM | POA: Diagnosis not present

## 2019-12-07 DIAGNOSIS — M7662 Achilles tendinitis, left leg: Secondary | ICD-10-CM

## 2019-12-07 DIAGNOSIS — M2042 Other hammer toe(s) (acquired), left foot: Secondary | ICD-10-CM | POA: Diagnosis not present

## 2019-12-07 DIAGNOSIS — L910 Hypertrophic scar: Secondary | ICD-10-CM | POA: Diagnosis not present

## 2019-12-08 ENCOUNTER — Other Ambulatory Visit: Payer: Self-pay

## 2019-12-11 ENCOUNTER — Other Ambulatory Visit: Payer: Self-pay

## 2019-12-13 ENCOUNTER — Ambulatory Visit: Payer: Commercial Managed Care - PPO | Attending: Internal Medicine

## 2019-12-13 DIAGNOSIS — U071 COVID-19: Secondary | ICD-10-CM | POA: Insufficient documentation

## 2019-12-13 DIAGNOSIS — R238 Other skin changes: Secondary | ICD-10-CM | POA: Insufficient documentation

## 2019-12-14 ENCOUNTER — Other Ambulatory Visit: Payer: Self-pay

## 2019-12-15 LAB — NOVEL CORONAVIRUS, NAA: SARS-CoV-2, NAA: DETECTED — AB

## 2019-12-24 NOTE — Progress Notes (Signed)
Patient ID: Cynthia Little, female   DOB: 10-Jan-1991, 29 y.o.   MRN: [id number]  Chief Complaint  Patient presents with  . Foot Pain    pt is here for a 6 week f/u of the left foot pain, pt states that her left foot, is doing a lot better since the last time she was here, pt also states that the injection she recieved last time has helped as well    Subjective:  Patient presents for followup, doing very well still having some pain from the tightness and having some pain from her scar. Objective:   Constitutional Well developed. Well nourished.  Musculoskeletal: All incisions healed to the left foot.  Slight hypertrophic scar to the posterior aspect of the Achilles tendon Left first metatarsophalangeal joint with good range of motion without pain to palpation. Left foot hammer toe incision healed without pain to palpation. Good Achilles strength. Ankle range of motion only to 0-5 degrees. No palpable dell at the Achilles tendon.   Radiographs:  None.  Assessment/Plan:  Patient was evaluated and treated and all questions answered.  1.  Status post cheilectomy of left foot, calcaneal osteotomy, heel spur resection, left foot hammertoe repair, gastrocnemius recession. -Patient is doing well she states that the injection did help but she still having some pain at the scar. I have ordered a second injection today which she elected to proceed. I do think she would benefit from therapy to increase her range of motion she is waiting on approval from the [place] for this. I offered her any assistance I could to expedite this to let us know how we can help her. Follow-up as needed  Procedure: Injection skin lesion Consent: Verbal consent obtained. Location: achilles left Skin Prep: Alcohol. Injectate: 1 cc 0.5% marcaine plain, 0.5 cc kenalog 40. Disposition: Patient tolerated procedure well. Injection site dressed with a band-aid.

## 2020-01-09 ENCOUNTER — Ambulatory Visit: Payer: No Typology Code available for payment source | Attending: Podiatry | Admitting: Physical Therapy

## 2020-01-09 ENCOUNTER — Other Ambulatory Visit: Payer: Self-pay

## 2020-01-09 ENCOUNTER — Encounter: Payer: Self-pay | Admitting: Physical Therapy

## 2020-01-09 DIAGNOSIS — R262 Difficulty in walking, not elsewhere classified: Secondary | ICD-10-CM | POA: Diagnosis not present

## 2020-01-09 DIAGNOSIS — M25572 Pain in left ankle and joints of left foot: Secondary | ICD-10-CM | POA: Diagnosis present

## 2020-01-09 DIAGNOSIS — M6281 Muscle weakness (generalized): Secondary | ICD-10-CM | POA: Diagnosis present

## 2020-01-09 NOTE — Addendum Note (Signed)
Addended by: Raeford Razor L on: 01/09/2020 05:18 PM   Modules accepted: Orders

## 2020-01-09 NOTE — Therapy (Signed)
[facility] [address] [place], [place], [address] Phone: [phone]   Fax:  [phone]  Physical Therapy Treatment  Patient Details  Name: Cynthia Little MRN: [id number] Date of Birth: 05/20/1991 Referring Provider (PT): Dr. Price    Encounter Date: 01/09/2020  PT End of Session - 01/09/20 1703    Visit Number  16    Number of Visits  31    Date for PT Re-Evaluation  03/05/20    Authorization Type  VA - 15 more visits approved    PT Start Time  0919    PT Stop Time  1000    PT Time Calculation (min)  41 min    Activity Tolerance  Patient tolerated treatment well    Behavior During Therapy  WFL for tasks assessed/performed       Past Medical History:  Diagnosis Date  . Anemia   . Asthma   . Bronchitis     Past Surgical History:  Procedure Laterality Date  . CALCANEAL OSTEOTOMY Left 05/17/2019   Procedure: CALCANEAL OSTECTOMY;  Surgeon: Price, Michael J, DPM;  Location: [facility];  Service: Podiatry;  Laterality: Left;  . CESAREAN SECTION  08/11/2012   Procedure: CESAREAN SECTION;  Surgeon: Thomas F Henley, MD;  Location: [facility];  Service: Gynecology;  Laterality: N/A;  . CHEILECTOMY Left 05/17/2019   Procedure: CHEILECTOMY LEFT FOOT;  Surgeon: Price, Michael J, DPM;  Location: [facility];  Service: Podiatry;  Laterality: Left;  . GASTROC RECESSION EXTREMITY Left 05/17/2019   Procedure: GASTROCNEMIUS RECESS;  Surgeon: Price, Michael J, DPM;  Location: [facility];  Service: Podiatry;  Laterality: Left;  . HAMMER TOE SURGERY Left 05/17/2019   Procedure: HAMMER TOE CORRECTION FIFTH;  Surgeon: Price, Michael J, DPM;  Location: [facility];  Service: Podiatry;  Laterality: Left;  . MINOR CLOSED MANIPULATION Left 05/17/2019   Procedure: MINOR CLOSED MANIPULATION;  Surgeon: Price, Michael J, DPM;  Location: [facility];  Service: Podiatry;  Laterality:  Left;  . NO PAST SURGERIES      There were no vitals filed for this visit.  Subjective Assessment - 01/09/20 0928    Subjective  Returns for ERO, reevaluation visit.  finally got her VA visits approved.  Has had injections to break up the scar tissue, which has helped.  She cont to have tightness in L foot, ankle and has swelling intermittently.  Has trouble when she has to sit at her desk and then goes to stand.  Limps when up on it too long.    Limitations  Sitting;Walking;Lifting;House hold activities;Standing    How long can you stand comfortably?  1 hour    How long can you walk comfortably?  1 hour or so    Currently in Pain?  Yes   tightness   Pain Score  3     Pain Location  Ankle    Pain Orientation  Left    Pain Descriptors / Indicators  Tightness    Pain Type  Surgical pain    Pain Onset  More than a month ago    Pain Frequency  Intermittent    Aggravating Factors   sit too long, stand too long    Pain Relieving Factors  rest, stretching    Effect of Pain on Daily Activities  limits activity and work         [facility] PT Assessment - 01/09/20 0001        Figure 8 Edema   Figure 8 - Right   20 1/2 inch     Figure 8 - Left   20 5/8      AROM   Left Ankle Dorsiflexion  15    Left Ankle Plantar Flexion  40    Left Ankle Inversion  45    Left Ankle Eversion  22      Strength   Left Ankle Dorsiflexion  5/5    Left Ankle Plantar Flexion  4/5    Left Ankle Inversion  5/5    Left Ankle Eversion  5/5        [facility] Adult PT Treatment/Exercise - 01/09/20 0001      Knee/Hip Exercises: Standing   Heel Raises  Both;2 sets;10 reps    Heel Raises Limitations  1 set with tennis ball between ankles     Step Down  Left;1 set;20 reps;Hand Hold: 1;Step Height: 4"    Step Down Limitations  hip compensation, no pain just tight     Functional Squat  1 set;15 reps    Functional Squat Limitations  10 lb KB no difficulty good form         PT Education - 01/09/20 1703    Education  Details  HEP, renewal    Person(s) Educated  Patient    Methods  Explanation    Comprehension  Verbalized understanding       PT Short Term Goals - 09/13/19 1602      PT SHORT TERM GOAL #1   Title  independent with initial HEP for L ankle AROM, strength    Status  Achieved      PT SHORT TERM GOAL #2   Title  Pt will be able to stand, walk in her home with pain min overall, least restrictive asst device as MD allows    Status  Achieved      PT SHORT TERM GOAL #3   Title  Pt will be able to perform SLS exercises on LLE, min dynamic for 30 sec to work toward full ankle function    Baseline  improving    Status  Partially Met      PT SHORT TERM GOAL #4   Title  Pt will note less swelling in LLE with normal daily activities    Baseline  mild swelling    Status  Partially Met        PT Long Term Goals - 01/09/20 1000      PT LONG TERM GOAL #1   Title  Pt will improve FOTO score to less than 35% impaired    Baseline  48%    Status  On-going    Target Date  03/05/20      PT LONG TERM GOAL #2   Title  Pt will be able to squat 25 lbs  with no pain (min tension) in LLE for fitness, functional mobility    Time  8    Period  Weeks    Status  Revised    Target Date  03/05/20      PT LONG TERM GOAL #3   Title  Pt will be able to walk quickly up and down stairs multiple times without use of hands, increased confidence.    Time  8    Period  Weeks    Status  Revised    Target Date  03/05/20      PT LONG TERM GOAL #4   Title  independent with advanced   HEP for LE    Time  8    Period  Weeks    Status  On-going    Target Date  03/05/20      PT LONG TERM GOAL #5   Title  Pt will perform dynamic balance activities in SLS without LOB or pain    Time  8    Period  Weeks    Status  New    Target Date  03/05/20            Plan - 01/09/20 1710    Clinical Impression Statement  Pt returns following several months with new aproval of visits for 15 visits.  She will likely  not need this many, expect 8-10 max.  She is unable to perform single leg heel raise on her affected leg.  Her balance is impaired in SLS for dynamic activity.  She compensates for weakness in step downs.  She will benefit from more PT to further improve her functional mobility.    Examination-Activity Limitations  Bathing;Squat;Stairs;Lift;Bed Mobility;Stand;Carry;Transfers;Sit    Examination-Participation Restrictions  Laundry;Shop;Cleaning;Community Activity;Yard Work;Interpersonal Relationship    Rehab Potential  Excellent    PT Frequency  2x / week    PT Duration  8 weeks   may only need 4-5 weeks   PT Treatment/Interventions  ADLs/Self Care Home Management;Therapeutic activities;Patient/family education;Taping;Therapeutic exercise;Cryotherapy;Ultrasound;Moist Heat;Gait training;Stair training;Functional mobility training;Neuromuscular re-education;Manual techniques;Passive range of motion;Scar mobilization;Vasopneumatic Device    PT Next Visit Plan  SLS, step downs, Reformer for footwork, manual for scar tissue/IASTM    PT Home Exercise Plan  ankle ROM, towel scrunch, eversion, seated PF, partial lunge, SLS    Consulted and Agree with Plan of Care  Patient       Patient will benefit from skilled therapeutic intervention in order to improve the following deficits and impairments:  Abnormal gait, Decreased activity tolerance, Decreased balance, Difficulty walking, Increased edema, Impaired flexibility, Decreased strength, Decreased range of motion, Hypomobility, Postural dysfunction, Decreased scar mobility, Increased fascial restricitons, Pain, Impaired sensation  Visit Diagnosis: Difficulty in walking, not elsewhere classified  Muscle weakness (generalized)  Pain in left ankle and joints of left foot     Problem List There are no problems to display for this patient.   Durinda Buzzelli 01/09/2020, 5:15 PM  [facility] [place], [place], [address] Phone: [phone]   Fax:  [phone]  Name: Yasmin Bronaugh MRN: [id number] Date of Birth: 07-19-1991  Raeford Razor, PT 01/09/20 5:15 PM Phone: [phone] Fax: [phone]

## 2020-01-18 ENCOUNTER — Ambulatory Visit: Payer: Self-pay | Admitting: Podiatry

## 2020-01-22 ENCOUNTER — Other Ambulatory Visit: Payer: Self-pay

## 2020-01-22 ENCOUNTER — Ambulatory Visit: Payer: No Typology Code available for payment source | Attending: Podiatry | Admitting: Physical Therapy

## 2020-01-22 ENCOUNTER — Encounter: Payer: Self-pay | Admitting: Physical Therapy

## 2020-01-22 DIAGNOSIS — M25561 Pain in right knee: Secondary | ICD-10-CM | POA: Insufficient documentation

## 2020-01-22 DIAGNOSIS — M25572 Pain in left ankle and joints of left foot: Secondary | ICD-10-CM

## 2020-01-22 DIAGNOSIS — M25562 Pain in left knee: Secondary | ICD-10-CM | POA: Insufficient documentation

## 2020-01-22 DIAGNOSIS — R262 Difficulty in walking, not elsewhere classified: Secondary | ICD-10-CM | POA: Insufficient documentation

## 2020-01-22 DIAGNOSIS — G8929 Other chronic pain: Secondary | ICD-10-CM | POA: Insufficient documentation

## 2020-01-22 DIAGNOSIS — M6281 Muscle weakness (generalized): Secondary | ICD-10-CM | POA: Insufficient documentation

## 2020-01-22 NOTE — Therapy (Signed)
[facility] [place], [place], [address] Phone: [phone]   Fax:  [phone]  Physical Therapy Treatment  Patient Details  Name: Cynthia Little MRN: [id number] Date of Birth: Jan 17, 1991 Referring Provider (PT): Dr. March Rummage    Encounter Date: 01/22/2020  PT End of Session - 01/22/20 1227    Visit Number  17    Number of Visits  31    Date for PT Re-Evaluation  03/05/20    Authorization Type  VA - 15 more visits approved    PT Start Time  1138    PT Stop Time  1216    PT Time Calculation (min)  38 min    Activity Tolerance  Patient tolerated treatment well    Behavior During Therapy  [facility] for tasks assessed/performed       Past Medical History:  Diagnosis Date  . Anemia   . Asthma   . Bronchitis     Past Surgical History:  Procedure Laterality Date  . CALCANEAL OSTEOTOMY Left 05/17/2019   Procedure: CALCANEAL OSTECTOMY;  Surgeon: Evelina Bucy, DPM;  Location: [facility];  Service: Podiatry;  Laterality: Left;  . CESAREAN SECTION  08/11/2012   Procedure: CESAREAN SECTION;  Surgeon: Melina Schools, MD;  Location: [facility];  Service: Gynecology;  Laterality: N/A;  . CHEILECTOMY Left 05/17/2019   Procedure: CHEILECTOMY LEFT FOOT;  Surgeon: Evelina Bucy, DPM;  Location: [facility];  Service: Podiatry;  Laterality: Left;  Marland Kitchen GASTROC RECESSION EXTREMITY Left 05/17/2019   Procedure: GASTROCNEMIUS RECESS;  Surgeon: Evelina Bucy, DPM;  Location: [facility];  Service: Podiatry;  Laterality: Left;  . HAMMER TOE SURGERY Left 05/17/2019   Procedure: HAMMER TOE CORRECTION FIFTH;  Surgeon: Evelina Bucy, DPM;  Location: [facility];  Service: Podiatry;  Laterality: Left;  . MINOR CLOSED MANIPULATION Left 05/17/2019   Procedure: MINOR CLOSED MANIPULATION;  Surgeon: Evelina Bucy, DPM;  Location: [facility];  Service: Podiatry;  Laterality:  Left;  . NO PAST SURGERIES      There were no vitals filed for this visit.  Subjective Assessment - 01/22/20 1140    Subjective  Arr a bit late. Was able to walk in the park a bit 1- 1.5 miles.  Gets tight.  Was sore last night, soaked in salts.          [facility] Adult PT Treatment/Exercise - 01/22/20 0001      Knee/Hip Exercises: Standing   Forward Step Up  Left;2 sets;15 reps;Hand Hold: 0;Step Height: 8"    Step Down  Left;2 sets;15 reps;Hand Hold: 1;Hand Hold: 0;Step Height: 6"    Step Down Limitations  used theraband to pull knee laterally to challenge medial knee        Manual Therapy   Soft tissue mobilization  gastroc/soleus, IASTM throughout, Achilles scar tissue cross friction    Passive ROM  ankle DF       Kinesiotix   Create Space  Achilles (short I transverse) and then 1 long I the length of the calf (25% stretch)       Ankle Exercises: Aerobic   Elliptical  5 min L1 resit. L10 ramp       Ankle Exercises: Standing   Heel Raises  Both;20 reps    Heel Walk (Round Trip)  with tennis ball between heels       Ankle Exercises: Stretches   Soleus Stretch  2 reps;30  seconds    Slant Board Stretch  3 reps;30 seconds             PT Education - 01/22/20 1227    Education Details  KT tape    Person(s) Educated  Patient    Methods  Explanation;Demonstration    Comprehension  Verbalized understanding;Returned demonstration       PT Short Term Goals - 01/22/20 1228      PT SHORT TERM GOAL #1   Title  independent with initial HEP for L ankle AROM, strength    Status  Achieved      PT SHORT TERM GOAL #2   Title  Pt will be able to stand, walk in her home with pain min overall, least restrictive asst device as MD allows    Status  Achieved      PT SHORT TERM GOAL #3   Title  Pt will be able to perform SLS exercises on LLE, min dynamic for 30 sec to work toward full ankle function    Status  On-going      PT SHORT TERM GOAL #4   Title  Pt will note less  swelling in LLE with normal daily activities    Status  Partially Met        PT Long Term Goals - 01/22/20 1229      PT LONG TERM GOAL #1   Title  Pt will improve FOTO score to less than 35% impaired    Status  On-going      PT LONG TERM GOAL #2   Title  Pt will be able to squat 25 lbs  with no pain (min tension) in LLE for fitness, functional mobility    Status  On-going      PT LONG TERM GOAL #3   Title  Pt will be able to walk quickly up and down stairs multiple times without use of hands, increased confidence.    Status  On-going      PT LONG TERM GOAL #4   Title  independent with advanced HEP for LE    Status  On-going      PT LONG TERM GOAL #5   Title  Pt will perform dynamic balance activities in SLS without LOB or pain    Status  On-going            Plan - 01/22/20 1229    Clinical Impression Statement  Worked in closed chain for balance and control with step ups.  Applied KT tape to support ankle and create less tension on scar/incision.    PT Treatment/Interventions  ADLs/Self Care Home Management;Therapeutic activities;Patient/family education;Taping;Therapeutic exercise;Cryotherapy;Ultrasound;Moist Heat;Gait training;Stair training;Functional mobility training;Neuromuscular re-education;Manual techniques;Passive range of motion;Scar mobilization;Vasopneumatic Device    PT Next Visit Plan  SLS, step downs, Reformer for footwork, manual for scar tissue/IASTM    PT Home Exercise Plan  ankle ROM, towel scrunch, eversion, seated PF, partial lunge, SLS    Consulted and Agree with Plan of Care  Patient       Patient will benefit from skilled therapeutic intervention in order to improve the following deficits and impairments:  Abnormal gait, Decreased activity tolerance, Decreased balance, Difficulty walking, Increased edema, Impaired flexibility, Decreased strength, Decreased range of motion, Hypomobility, Postural dysfunction, Decreased scar mobility, Increased  fascial restricitons, Pain, Impaired sensation  Visit Diagnosis: Difficulty in walking, not elsewhere classified  Muscle weakness (generalized)  Pain in left ankle and joints of left foot     Problem List There are  no problems to display for this patient.   Cynthia Little 01/22/2020, 12:37 PM  [facility] [address] [place], [place], [address] Phone: [phone]   Fax:  [phone]  Name: Cynthia Little MRN: [id number] Date of Birth: 11/19/1991  Raeford Razor, PT 01/22/20 12:37 PM Phone: [phone] Fax: [phone]

## 2020-01-23 ENCOUNTER — Ambulatory Visit (HOSPITAL_COMMUNITY)
Admission: EM | Admit: 2020-01-23 | Discharge: 2020-01-23 | Disposition: A | Discharge status: Home / Self Care | Payer: No Typology Code available for payment source | Attending: Family Medicine | Admitting: Family Medicine

## 2020-01-23 ENCOUNTER — Other Ambulatory Visit: Payer: Self-pay

## 2020-01-23 ENCOUNTER — Encounter (HOSPITAL_COMMUNITY): Payer: Self-pay

## 2020-01-23 DIAGNOSIS — Z3202 Encounter for pregnancy test, result negative: Secondary | ICD-10-CM

## 2020-01-23 DIAGNOSIS — B373 Candidiasis of vulva and vagina: Secondary | ICD-10-CM | POA: Diagnosis not present

## 2020-01-23 DIAGNOSIS — B3731 Acute candidiasis of vulva and vagina: Secondary | ICD-10-CM

## 2020-01-23 LAB — POCT URINALYSIS DIP (DEVICE)
Bilirubin Urine: NEGATIVE
Glucose, UA: NEGATIVE mg/dL
Hgb urine dipstick: NEGATIVE
Ketones, ur: NEGATIVE mg/dL
Nitrite: NEGATIVE
Protein, ur: NEGATIVE mg/dL
Specific Gravity, Urine: >=1.03 (ref 1.005–1.030)
Urobilinogen, UA: 0.2 mg/dL (ref 0.0–1.0)
pH: 5.5 (ref 5.0–8.0)

## 2020-01-23 LAB — POCT PREGNANCY, URINE: Preg Test, Ur: NEGATIVE

## 2020-01-23 LAB — POC URINE PREG, ED: Preg Test, Ur: NEGATIVE

## 2020-01-23 MED ORDER — FLUCONAZOLE 150 MG PO TABS: 150.0000 mg | ORAL_TABLET | Freq: Every day | ORAL | 0 refills | Status: DC

## 2020-01-23 NOTE — Discharge Instructions (Signed)
Take the diflucan as directed Supplement vitamin D 2000 IU daily See a PCP about the sweats

## 2020-01-23 NOTE — ED Provider Notes (Signed)
[facility]    CSN: [id number] Arrival date & time: 01/23/20  [id number]      History   Chief Complaint Chief Complaint  Patient presents with  . vaginal pain    HPI Cynthia Little is a 29 y.o. female.   HPI  Patient has burning with urination.  She states that she has irritation on the outer vaginal region and it hurts when the urine hits this area.  No vaginal discharge.  No itching.  No odor.  No concern for STD.  She is not having any urinary frequency.  Only the pain with urination She states that she always gets some cramping after her menstrual period, but this month she is having cramping for 2 weeks after her period instead of just 1 month.  No vaginal bleeding.  She does not feel like she is pregnant. She also states that she has been having night sweats over the last couple of months.  It is not every night.  Usually it is just her head down.  One night she states her whole body felt down.  Is on no new medicines, supplements, foods, and does not have any other symptoms.  I explained her that she needs to see a primary care doctor for issues such as night sweats.  Past Medical History:  Diagnosis Date  . Anemia   . Asthma   . Bronchitis     There are no problems to display for this patient.   Past Surgical History:  Procedure Laterality Date  . CALCANEAL OSTEOTOMY Left 05/17/2019   Procedure: CALCANEAL OSTECTOMY;  Surgeon: Evelina Bucy, DPM;  Location: [facility];  Service: Podiatry;  Laterality: Left;  . CESAREAN SECTION  08/11/2012   Procedure: CESAREAN SECTION;  Surgeon: Melina Schools, MD;  Location: [facility];  Service: Gynecology;  Laterality: N/A;  . CHEILECTOMY Left 05/17/2019   Procedure: CHEILECTOMY LEFT FOOT;  Surgeon: Evelina Bucy, DPM;  Location: [facility];  Service: Podiatry;  Laterality: Left;  Marland Kitchen GASTROC RECESSION EXTREMITY Left 05/17/2019   Procedure: GASTROCNEMIUS RECESS;  Surgeon: Evelina Bucy, DPM;   Location: [facility];  Service: Podiatry;  Laterality: Left;  . HAMMER TOE SURGERY Left 05/17/2019   Procedure: HAMMER TOE CORRECTION FIFTH;  Surgeon: Evelina Bucy, DPM;  Location: [facility];  Service: Podiatry;  Laterality: Left;  . MINOR CLOSED MANIPULATION Left 05/17/2019   Procedure: MINOR CLOSED MANIPULATION;  Surgeon: Evelina Bucy, DPM;  Location: [facility];  Service: Podiatry;  Laterality: Left;  . NO PAST SURGERIES      OB History    Gravida  2   Para  1   Term  1   Preterm  0   AB  1   Living  1     SAB  1   TAB  0   Ectopic  0   Multiple  0   Live Births  1            Home Medications    Prior to Admission medications   Medication Sig Start Date End Date Taking? Authorizing Provider  Apple Cider Vinegar 500 MG TABS Take by mouth.    Joline Salt, RN  Collagen 500 MG CAPS Take by mouth.    Joline Salt, RN  fluconazole (DIFLUCAN) 150 MG tablet Take 1 tablet (150 mg total) by mouth daily. Repeat in 1 week if needed 01/23/20   Meda Coffee,  Jennette Banker, MD  Multiple Vitamins-Minerals (ONE DAILY MULTIVITAMIN WOMEN PO) Take by mouth.    Joline Salt, RN  Specialty Vitamins Products (BIOTIN PLUS KERATIN) 10000-100 MCG-MG TABS Take by mouth.    Joline Salt, RN  enoxaparin (LOVENOX) 40 MG/0.4ML injection Inject 0.4 mLs (40 mg total) into the skin every 12 (twelve) hours. 05/17/19 01/23/20  Evelina Bucy, DPM    Family History Family History  Problem Relation Age of Onset  . Diabetes Father     Social History Social History   Tobacco Use  . Smoking status: Never Smoker  . Smokeless tobacco: Never Used  Substance Use Topics  . Alcohol use: No    Comment: rare  . Drug use: No     Allergies   Cephalexin   Review of Systems Review of Systems  Constitutional: Positive for diaphoresis. Negative for activity change, appetite change, chills, fatigue and fever.       Night sweats    Genitourinary: Positive for dysuria. Negative for frequency, vaginal bleeding and vaginal discharge.  Skin: Negative for rash.     Physical Exam Triage Vital Signs ED Triage Vitals  Enc Vitals Group     BP 01/23/20 0957 (!) 106/58     Pulse Rate 01/23/20 0957 72     Resp 01/23/20 0957 16     Temp 01/23/20 0957 98.8 F (37.1 C)     Temp Source 01/23/20 0957 Oral     SpO2 01/23/20 0957 100 %     Weight --      Height --      Head Circumference --      Peak Flow --      Pain Score 01/23/20 1016 4     Pain Loc --      Pain Edu? --      Excl. in [place]? --    No data found.  Updated Vital Signs BP (!) 106/58   Pulse 72   Temp 98.8 F (37.1 C) (Oral)   Resp 16   LMP 01/03/2020   SpO2 100%      Physical Exam Constitutional:      General: She is not in acute distress.    Appearance: She is well-developed. She is obese.  HENT:     Head: Normocephalic and atraumatic.  Eyes:     Conjunctiva/sclera: Conjunctivae normal.     Pupils: Pupils are equal, round, and reactive to light.  Cardiovascular:     Rate and Rhythm: Normal rate.  Pulmonary:     Effort: Pulmonary effort is normal. No respiratory distress.  Abdominal:     General: Abdomen is flat. There is no distension.     Palpations: Abdomen is soft.     Tenderness: There is no abdominal tenderness.  Genitourinary:    Comments: Labia minora some external genitalia has some thick white discharge with mild irritation.  No lesion or rash Musculoskeletal:        General: Normal range of motion.     Cervical back: Normal range of motion.  Skin:    General: Skin is warm and dry.  Neurological:     Mental Status: She is alert.  Psychiatric:        Mood and Affect: Mood normal.        Behavior: Behavior normal.      UC Treatments / Results  Labs (all labs ordered are listed, but only abnormal results are displayed) Labs Reviewed  POCT URINALYSIS DIP (DEVICE) - Abnormal;  Notable for the following components:       Result Value   Leukocytes,Ua SMALL (*)    All other components within normal limits  POCT PREGNANCY, URINE  POC URINE PREG, ED   Pregnancy test is negative.  Urinalysis is negative. EKG   Radiology No results found.  Procedures Procedures (including critical care time)  Medications Ordered in UC Medications - No data to display  Initial Impression / Assessment and Plan / UC Course  I have reviewed the triage vital signs and the nursing notes.  Pertinent labs & imaging results that were available during my care of the patient were reviewed by me and considered in my medical decision making (see chart for details).      Final Clinical Impressions(s) / UC Diagnoses   Final diagnoses:  Vulvovaginitis due to yeast     Discharge Instructions     Take the diflucan as directed Supplement vitamin D 2000 IU daily See a PCP about the sweats   ED Prescriptions    Medication Sig Dispense Auth. Provider   fluconazole (DIFLUCAN) 150 MG tablet Take 1 tablet (150 mg total) by mouth daily. Repeat in 1 week if needed 2 tablet Raylene Everts, MD     PDMP not reviewed this encounter.   Raylene Everts, MD 01/23/20 1248

## 2020-01-23 NOTE — ED Triage Notes (Signed)
Pt c/o vaginal burning/irritation since yesterday. Denies vag discharge or abnormal bleeding. Denies fever, chills, dysuria sx. Also c/o abd pain lower area intermittently for past couple weeks.  Also states she has had periodic night sweats over past couple months, denies cough, hemoptysis or weight loss.

## 2020-01-24 ENCOUNTER — Other Ambulatory Visit: Payer: Self-pay

## 2020-01-24 ENCOUNTER — Ambulatory Visit: Payer: No Typology Code available for payment source | Admitting: Physical Therapy

## 2020-01-24 DIAGNOSIS — M6281 Muscle weakness (generalized): Secondary | ICD-10-CM

## 2020-01-24 DIAGNOSIS — R262 Difficulty in walking, not elsewhere classified: Secondary | ICD-10-CM | POA: Diagnosis not present

## 2020-01-24 DIAGNOSIS — M25572 Pain in left ankle and joints of left foot: Secondary | ICD-10-CM

## 2020-01-24 NOTE — Therapy (Signed)
[place], [place], [address] Phone: [phone]   Fax:  [phone]  Physical Therapy Treatment  Patient Details  Name: Cynthia Little MRN: [id number] Date of Birth: December 14, 1991 Referring Provider (PT): Dr. March Rummage    Encounter Date: 01/24/2020  PT End of Session - 01/24/20 1228    Visit Number  18    Number of Visits  31    Date for PT Re-Evaluation  03/05/20    Authorization Type  [place] - 15 more visits approved    Authorization - Visit Number  3    Authorization - Number of Visits  15    PT Start Time  1004    PT Stop Time  1045    PT Time Calculation (min)  41 min    Activity Tolerance  Patient tolerated treatment well    Behavior During Therapy  [facility] for tasks assessed/performed       Past Medical History:  Diagnosis Date  . Anemia   . Asthma   . Bronchitis     Past Surgical History:  Procedure Laterality Date  . CALCANEAL OSTEOTOMY Left 05/17/2019   Procedure: CALCANEAL OSTECTOMY;  Surgeon: Evelina Bucy, DPM;  Location: [place];  Service: Podiatry;  Laterality: Left;  . CESAREAN SECTION  08/11/2012   Procedure: CESAREAN SECTION;  Surgeon: Melina Schools, MD;  Location: [facility];  Service: Gynecology;  Laterality: N/A;  . CHEILECTOMY Left 05/17/2019   Procedure: CHEILECTOMY LEFT FOOT;  Surgeon: Evelina Bucy, DPM;  Location: [place];  Service: Podiatry;  Laterality: Left;  Marland Kitchen GASTROC RECESSION EXTREMITY Left 05/17/2019   Procedure: GASTROCNEMIUS RECESS;  Surgeon: Evelina Bucy, DPM;  Location: [place];  Service: Podiatry;  Laterality: Left;  . HAMMER TOE SURGERY Left 05/17/2019   Procedure: HAMMER TOE CORRECTION FIFTH;  Surgeon: Evelina Bucy, DPM;  Location: [place];  Service: Podiatry;  Laterality: Left;  . MINOR CLOSED MANIPULATION Left 05/17/2019   Procedure: MINOR CLOSED MANIPULATION;  Surgeon: Evelina Bucy,  DPM;  Location: [place];  Service: Podiatry;  Laterality: Left;  . NO PAST SURGERIES      There were no vitals filed for this visit.  Subjective Assessment - 01/24/20 1019    Subjective  That tape really helped. No pain .    Currently in Pain?  No/denies         [facility] Adult PT Treatment/Exercise - 01/24/20 0001      Manual Therapy   Soft tissue mobilization  gastroc/soleus, IASTM throughout, Achilles scar tissue cross friction    Passive ROM  ankle DF , INV, EV       Kinesiotix   Create Space  Achilles (short I transverse) and then 1 long I the length of the calf (25% stretch)       Ankle Exercises: Aerobic   Elliptical  5 min L1 resit. L10 ramp       Ankle Exercises: Standing   Rebounder  deep squat x 10 , SLS 2 x 20 with min balance disturbance     Other Standing Ankle Exercises  reverse lunge with towel slide x 10     Other Standing Ankle Exercises  blue band hip hinge x 10 each leg double leg then single leg       Ankle Exercises: Stretches   Slant Board Stretch  3 reps;30 seconds  PT Short Term Goals - 01/22/20 1228      PT SHORT TERM GOAL #1   Title  independent with initial HEP for L ankle AROM, strength    Status  Achieved      PT SHORT TERM GOAL #2   Title  Pt will be able to stand, walk in her home with pain min overall, least restrictive asst device as MD allows    Status  Achieved      PT SHORT TERM GOAL #3   Title  Pt will be able to perform SLS exercises on LLE, min dynamic for 30 sec to work toward full ankle function    Status  On-going      PT SHORT TERM GOAL #4   Title  Pt will note less swelling in LLE with normal daily activities    Status  Partially Met        PT Long Term Goals - 01/22/20 1229      PT LONG TERM GOAL #1   Title  Pt will improve FOTO score to less than 35% impaired    Status  On-going      PT LONG TERM GOAL #2   Title  Pt will be able to squat 25 lbs  with no pain (min tension) in LLE  for fitness, functional mobility    Status  On-going      PT LONG TERM GOAL #3   Title  Pt will be able to walk quickly up and down stairs multiple times without use of hands, increased confidence.    Status  On-going      PT LONG TERM GOAL #4   Title  independent with advanced HEP for LE    Status  On-going      PT LONG TERM GOAL #5   Title  Pt will perform dynamic balance activities in SLS without LOB or pain    Status  On-going            Plan - 01/24/20 1229    Clinical Impression Statement  Repeated KT tape as previous as it reduced pain and tension throughout ankle.  Worked on LLE control and stability. Walks without limp and with normal gait speed.    PT Treatment/Interventions  ADLs/Self Care Home Management;Therapeutic activities;Patient/family education;Taping;Therapeutic exercise;Cryotherapy;Ultrasound;Moist Heat;Gait training;Stair training;Functional mobility training;Neuromuscular re-education;Manual techniques;Passive range of motion;Scar mobilization;Vasopneumatic Device    PT Next Visit Plan  SLS, step downs, Reformer for footwork, manual for scar tissue/IASTM    PT Home Exercise Plan  ankle ROM, towel scrunch, eversion, seated PF, partial lunge, SLS    Consulted and Agree with Plan of Care  Patient       Patient will benefit from skilled therapeutic intervention in order to improve the following deficits and impairments:  Abnormal gait, Decreased activity tolerance, Decreased balance, Difficulty walking, Increased edema, Impaired flexibility, Decreased strength, Decreased range of motion, Hypomobility, Postural dysfunction, Decreased scar mobility, Increased fascial restricitons, Pain, Impaired sensation  Visit Diagnosis: Difficulty in walking, not elsewhere classified  Muscle weakness (generalized)  Pain in left ankle and joints of left foot     Problem List There are no problems to display for this patient.   , 01/24/2020, 12:36 PM  [place], [place], [address] Phone: [phone]   Fax:  [phone]  Name: Karle Desrosier MRN: [id number] Date of Birth: 1991-11-08  Raeford Razor, PT 01/24/20 12:36 PM Phone: [phone] Fax: [phone]

## 2020-01-26 ENCOUNTER — Ambulatory Visit: Payer: Self-pay | Admitting: Podiatry

## 2020-01-29 ENCOUNTER — Encounter: Payer: Self-pay | Admitting: Physical Therapy

## 2020-01-29 ENCOUNTER — Other Ambulatory Visit: Payer: Self-pay

## 2020-01-29 ENCOUNTER — Ambulatory Visit: Payer: No Typology Code available for payment source | Admitting: Physical Therapy

## 2020-01-29 DIAGNOSIS — M6281 Muscle weakness (generalized): Secondary | ICD-10-CM

## 2020-01-29 DIAGNOSIS — R262 Difficulty in walking, not elsewhere classified: Secondary | ICD-10-CM | POA: Diagnosis not present

## 2020-01-29 DIAGNOSIS — M25572 Pain in left ankle and joints of left foot: Secondary | ICD-10-CM

## 2020-01-29 NOTE — Therapy (Signed)
[place], [place], [address] Phone: [phone]   Fax:  [phone]  Physical Therapy Treatment  Patient Details  Name: Cynthia Little MRN: [id number] Date of Birth: Aug 04, 1991 Referring Provider (PT): Dr. March Rummage    Encounter Date: 01/29/2020  PT End of Session - 01/29/20 0755    Visit Number  19    Number of Visits  31    Date for PT Re-Evaluation  03/05/20    Authorization Type  [place] - 15 more visits approved    Authorization - Visit Number  4    Authorization - Number of Visits  15    PT Start Time  0745    PT Stop Time  0828    PT Time Calculation (min)  43 min    Activity Tolerance  Patient tolerated treatment well    Behavior During Therapy  [facility] for tasks assessed/performed       Past Medical History:  Diagnosis Date  . Anemia   . Asthma   . Bronchitis     Past Surgical History:  Procedure Laterality Date  . CALCANEAL OSTEOTOMY Left 05/17/2019   Procedure: CALCANEAL OSTECTOMY;  Surgeon: Evelina Bucy, DPM;  Location: [place];  Service: Podiatry;  Laterality: Left;  . CESAREAN SECTION  08/11/2012   Procedure: CESAREAN SECTION;  Surgeon: Melina Schools, MD;  Location: [facility] [facility];  Service: Gynecology;  Laterality: N/A;  . CHEILECTOMY Left 05/17/2019   Procedure: CHEILECTOMY LEFT FOOT;  Surgeon: Evelina Bucy, DPM;  Location: [place];  Service: Podiatry;  Laterality: Left;  Marland Kitchen GASTROC RECESSION EXTREMITY Left 05/17/2019   Procedure: GASTROCNEMIUS RECESS;  Surgeon: Evelina Bucy, DPM;  Location: [place];  Service: Podiatry;  Laterality: Left;  . HAMMER TOE SURGERY Left 05/17/2019   Procedure: HAMMER TOE CORRECTION FIFTH;  Surgeon: Evelina Bucy, DPM;  Location: [place];  Service: Podiatry;  Laterality: Left;  . MINOR CLOSED MANIPULATION Left 05/17/2019   Procedure: MINOR CLOSED MANIPULATION;  Surgeon: Evelina Bucy,  DPM;  Location: [place];  Service: Podiatry;  Laterality: Left;  . NO PAST SURGERIES      There were no vitals filed for this visit.  Subjective Assessment - 01/29/20 0752    Subjective  The tape is helping to flatten out the scar.  A tad tight, not unbearable.    Currently in Pain?  No/denies       [facility] Adult PT Treatment/Exercise - 01/29/20 0001      Pilates   Pilates Reformer  Footwork Heel raises 3 x 10,  2 Red 1 Green in parallel, turn in and turn out.  Double leg press out x 15, Single leg stretching to each leg, prancing for dynamic stretch       Knee/Hip Exercises: Aerobic   Elliptical  5 min L6 resist and L6 ramp       Knee/Hip Exercises: Standing   Hip Flexion Limitations  green band x10     Abduction Limitations  green band x 15 each rep     Forward Step Up  Left;1 set;20 reps;Hand Hold: 0;Step Height: 6"    Step Down  Left;2 sets;15 reps;Hand Hold: 2;Hand Hold: 0;Step Height: 4";Step Height: 6"    Step Down Limitations  used UE assist for higher step       Kinesiotix   Create Space  1 transverse strip  PT Short Term Goals - 01/22/20 1228      PT SHORT TERM GOAL #1   Title  independent with initial HEP for L ankle AROM, strength    Status  Achieved      PT SHORT TERM GOAL #2   Title  Pt will be able to stand, walk in her home with pain min overall, least restrictive asst device as MD allows    Status  Achieved      PT SHORT TERM GOAL #3   Title  Pt will be able to perform SLS exercises on LLE, min dynamic for 30 sec to work toward full ankle function    Status  On-going      PT SHORT TERM GOAL #4   Title  Pt will note less swelling in LLE with normal daily activities    Status  Partially Met        PT Long Term Goals - 01/22/20 1229      PT LONG TERM GOAL #1   Title  Pt will improve FOTO score to less than 35% impaired    Status  On-going      PT LONG TERM GOAL #2   Title  Pt will be able to squat 25 lbs   with no pain (min tension) in LLE for fitness, functional mobility    Status  On-going      PT LONG TERM GOAL #3   Title  Pt will be able to walk quickly up and down stairs multiple times without use of hands, increased confidence.    Status  On-going      PT LONG TERM GOAL #4   Title  independent with advanced HEP for LE    Status  On-going      PT LONG TERM GOAL #5   Title  Pt will perform dynamic balance activities in SLS without LOB or pain    Status  On-going            Plan - 01/29/20 0819    Clinical Impression Statement  Exercise was well tolerated.  She lacks full plantarflexion strength on Reformer.  Scar tissue notably "flatter" on Achilles following tape. Demonstrated how she can do the tape at home , chose to just do the transverse strip to see if that alone would benefit.    PT Treatment/Interventions  ADLs/Self Care Home Management;Therapeutic activities;Patient/family education;Taping;Therapeutic exercise;Cryotherapy;Ultrasound;Moist Heat;Gait training;Stair training;Functional mobility training;Neuromuscular re-education;Manual techniques;Passive range of motion;Scar mobilization;Vasopneumatic Device    PT Next Visit Plan  SLS, step downs, Reformer for footwork, manual for scar tissue/IASTM and tape?    PT Home Exercise Plan  (ankle ROM, towel scrunch, eversion, seated PF ALL DC ), partial lunge, SLS, standing heel raise and calf stretching    Consulted and Agree with Plan of Care  Patient       Patient will benefit from skilled therapeutic intervention in order to improve the following deficits and impairments:  Abnormal gait, Decreased activity tolerance, Decreased balance, Difficulty walking, Increased edema, Impaired flexibility, Decreased strength, Decreased range of motion, Hypomobility, Postural dysfunction, Decreased scar mobility, Increased fascial restricitons, Pain, Impaired sensation  Visit Diagnosis: Difficulty in walking, not elsewhere  classified  Muscle weakness (generalized)  Pain in left ankle and joints of left foot     Problem List There are no problems to display for this patient.   Tanyia Grabbe 01/29/2020, 8:35 AM  [facility] [address] [place], [place], [address] Phone: [phone]  Fax:  [phone]  Name: Abbygayle Helfand MRN: [id number] Date of Birth: 14-Nov-1991  Raeford Razor, PT 01/29/20 8:35 AM Phone: [phone] Fax: [phone]

## 2020-02-09 ENCOUNTER — Ambulatory Visit: Payer: No Typology Code available for payment source | Admitting: Physical Therapy

## 2020-02-09 ENCOUNTER — Other Ambulatory Visit: Payer: Self-pay

## 2020-02-09 ENCOUNTER — Encounter: Payer: Self-pay | Admitting: Physical Therapy

## 2020-02-09 DIAGNOSIS — R262 Difficulty in walking, not elsewhere classified: Secondary | ICD-10-CM

## 2020-02-09 DIAGNOSIS — M25562 Pain in left knee: Secondary | ICD-10-CM

## 2020-02-09 DIAGNOSIS — G8929 Other chronic pain: Secondary | ICD-10-CM

## 2020-02-09 DIAGNOSIS — M25572 Pain in left ankle and joints of left foot: Secondary | ICD-10-CM

## 2020-02-09 DIAGNOSIS — M6281 Muscle weakness (generalized): Secondary | ICD-10-CM

## 2020-02-09 NOTE — Therapy (Signed)
[place], [place], [address] Phone: [phone]   Fax:  [phone]  Physical Therapy Treatment  Patient Details  Name: Cynthia Little MRN: [id number] Date of Birth: Jan 03, 1991 Referring Provider (PT): Dr. March Rummage    Encounter Date: 02/09/2020  PT End of Session - 02/09/20 1228    Visit Number  20    Number of Visits  31    Date for PT Re-Evaluation  03/05/20    Authorization Type  [place] - 15 more visits approved    Authorization - Visit Number  5    Authorization - Number of Visits  15    PT Start Time  2376    PT Stop Time  1305    PT Time Calculation (min)  45 min    Activity Tolerance  Patient tolerated treatment well    Behavior During Therapy  [facility] for tasks assessed/performed       Past Medical History:  Diagnosis Date  . Anemia   . Asthma   . Bronchitis     Past Surgical History:  Procedure Laterality Date  . CALCANEAL OSTEOTOMY Left 05/17/2019   Procedure: CALCANEAL OSTECTOMY;  Surgeon: Evelina Bucy, DPM;  Location: [place];  Service: Podiatry;  Laterality: Left;  . CESAREAN SECTION  08/11/2012   Procedure: CESAREAN SECTION;  Surgeon: Melina Schools, MD;  Location: [facility];  Service: Gynecology;  Laterality: N/A;  . CHEILECTOMY Left 05/17/2019   Procedure: CHEILECTOMY LEFT FOOT;  Surgeon: Evelina Bucy, DPM;  Location: [place];  Service: Podiatry;  Laterality: Left;  Marland Kitchen GASTROC RECESSION EXTREMITY Left 05/17/2019   Procedure: GASTROCNEMIUS RECESS;  Surgeon: Evelina Bucy, DPM;  Location: [place];  Service: Podiatry;  Laterality: Left;  . HAMMER TOE SURGERY Left 05/17/2019   Procedure: HAMMER TOE CORRECTION FIFTH;  Surgeon: Evelina Bucy, DPM;  Location: [place];  Service: Podiatry;  Laterality: Left;  . MINOR CLOSED MANIPULATION Left 05/17/2019   Procedure: MINOR CLOSED MANIPULATION;  Surgeon: Evelina Bucy,  DPM;  Location: [place];  Service: Podiatry;  Laterality: Left;  . NO PAST SURGERIES      There were no vitals filed for this visit.  Subjective Assessment - 02/09/20 1226    Subjective  I'm doing pretty good, did some jumprope.    Currently in Pain?  Yes    Pain Score  4     Pain Location  Ankle    Pain Orientation  Left    Pain Descriptors / Indicators  Tightness    Pain Type  Chronic pain    Pain Onset  More than a month ago           [facility] Adult PT Treatment/Exercise - 02/09/20 0001      Knee/Hip Exercises: Aerobic   Elliptical  6 min L6 resist and L6 ramp        Pilates Reformer used for LE/core strength, postural strength, lumbopelvic disassociation and core control.  Exercises included:  Footwork 2 Red 1 blue   Double leg press out On toes, add heel raise   heel raises slow and with control x 15   Running Wide double leg press out 2 Red 1 blue   Bridge 2 Red 1 blue x 10, unable to get full hip extension   Hip stretching 1 Red bilateral hamstring, ITB and hip flexor  Jump board 2 RED (change to 1 red 1  blue or single leg )  Parallel narrow Parallel wide Alt wide and narrow Narrow V Narrow V with adduction (heel tap) Single leg Alt set x 5   All reps done about 10 -15 unless noted      PT Short Term Goals - 01/22/20 1228      PT SHORT TERM GOAL #1   Title  independent with initial HEP for L ankle AROM, strength    Status  Achieved      PT SHORT TERM GOAL #2   Title  Pt will be able to stand, walk in her home with pain min overall, least restrictive asst device as MD allows    Status  Achieved      PT SHORT TERM GOAL #3   Title  Pt will be able to perform SLS exercises on LLE, min dynamic for 30 sec to work toward full ankle function    Status  On-going      PT SHORT TERM GOAL #4   Title  Pt will note less swelling in LLE with normal daily activities    Status  Partially Met        PT Long Term Goals - 01/22/20 1229       PT LONG TERM GOAL #1   Title  Pt will improve FOTO score to less than 35% impaired    Status  On-going      PT LONG TERM GOAL #2   Title  Pt will be able to squat 25 lbs  with no pain (min tension) in LLE for fitness, functional mobility    Status  On-going      PT LONG TERM GOAL #3   Title  Pt will be able to walk quickly up and down stairs multiple times without use of hands, increased confidence.    Status  On-going      PT LONG TERM GOAL #4   Title  independent with advanced HEP for LE    Status  On-going      PT LONG TERM GOAL #5   Title  Pt will perform dynamic balance activities in SLS without LOB or pain    Status  On-going            Plan - 02/09/20 1247    Clinical Impression Statement  Pt improving, able to perform more demanding activities without increased lasting pain. Worked on endurance today, used jumpboard on Reformer.  She lacks full knee extension/hip adduction in jump which is a product of a novel task as well as weakness.    PT Treatment/Interventions  ADLs/Self Care Home Management;Therapeutic activities;Patient/family education;Taping;Therapeutic exercise;Cryotherapy;Ultrasound;Moist Heat;Gait training;Stair training;Functional mobility training;Neuromuscular re-education;Manual techniques;Passive range of motion;Scar mobilization;Vasopneumatic Device    PT Next Visit Plan  SLS, step downs, Reformer for footwork, manual for scar tissue/IASTM and tape?    PT Home Exercise Plan  (ankle ROM, towel scrunch, eversion, seated PF ALL DC ), partial lunge, SLS, standing heel raise and calf stretching    Consulted and Agree with Plan of Care  Patient       Patient will benefit from skilled therapeutic intervention in order to improve the following deficits and impairments:  Abnormal gait, Decreased activity tolerance, Decreased balance, Difficulty walking, Increased edema, Impaired flexibility, Decreased strength, Decreased range of motion, Hypomobility, Postural  dysfunction, Decreased scar mobility, Increased fascial restricitons, Pain, Impaired sensation  Visit Diagnosis: Difficulty in walking, not elsewhere classified  Muscle weakness (generalized)  Pain in left ankle and joints of left foot  Chronic pain of right knee  Chronic pain of left knee     Problem List There are no problems to display for this patient.   Ethelyn Cerniglia 02/09/2020, 1:10 PM  [facility] [address] [place], [place], [address] Phone: [phone]   Fax:  [phone]  Name: Cynthia Little MRN: [id number] Date of Birth: Aug 18, 1991  Raeford Razor, PT 02/09/20 1:12 PM Phone: [phone] Fax: [phone]

## 2020-02-12 ENCOUNTER — Ambulatory Visit: Payer: No Typology Code available for payment source | Admitting: Physical Therapy

## 2020-02-12 ENCOUNTER — Encounter: Payer: Self-pay | Admitting: Physical Therapy

## 2020-02-12 ENCOUNTER — Other Ambulatory Visit: Payer: Self-pay

## 2020-02-12 DIAGNOSIS — M6281 Muscle weakness (generalized): Secondary | ICD-10-CM

## 2020-02-12 DIAGNOSIS — M25572 Pain in left ankle and joints of left foot: Secondary | ICD-10-CM

## 2020-02-12 DIAGNOSIS — R262 Difficulty in walking, not elsewhere classified: Secondary | ICD-10-CM | POA: Diagnosis not present

## 2020-02-12 NOTE — Therapy (Signed)
[place], [place], [address] Phone: [phone]   Fax:  [phone]  Physical Therapy Treatment  Patient Details  Name: Cynthia Little MRN: [id number] Date of Birth: Mar 17, 1991 Referring Provider (PT): Dr. March Rummage    Encounter Date: 02/12/2020  PT End of Session - 02/12/20 0845    Visit Number  21    Number of Visits  31    Date for PT Re-Evaluation  03/05/20    Authorization Type  [place] - 15 more visits approved    Authorization - Visit Number  6    Authorization - Number of Visits  15    PT Start Time  0830    PT Stop Time  0918    PT Time Calculation (min)  48 min    Activity Tolerance  Patient tolerated treatment well    Behavior During Therapy  [facility] for tasks assessed/performed       Past Medical History:  Diagnosis Date  . Anemia   . Asthma   . Bronchitis     Past Surgical History:  Procedure Laterality Date  . CALCANEAL OSTEOTOMY Left 05/17/2019   Procedure: CALCANEAL OSTECTOMY;  Surgeon: Evelina Bucy, DPM;  Location: [place];  Service: Podiatry;  Laterality: Left;  . CESAREAN SECTION  08/11/2012   Procedure: CESAREAN SECTION;  Surgeon: Melina Schools, MD;  Location: [facility];  Service: Gynecology;  Laterality: N/A;  . CHEILECTOMY Left 05/17/2019   Procedure: CHEILECTOMY LEFT FOOT;  Surgeon: Evelina Bucy, DPM;  Location: [place];  Service: Podiatry;  Laterality: Left;  Marland Kitchen GASTROC RECESSION EXTREMITY Left 05/17/2019   Procedure: GASTROCNEMIUS RECESS;  Surgeon: Evelina Bucy, DPM;  Location: [place];  Service: Podiatry;  Laterality: Left;  . HAMMER TOE SURGERY Left 05/17/2019   Procedure: HAMMER TOE CORRECTION FIFTH;  Surgeon: Evelina Bucy, DPM;  Location: [place];  Service: Podiatry;  Laterality: Left;  . MINOR CLOSED MANIPULATION Left 05/17/2019   Procedure: MINOR CLOSED MANIPULATION;  Surgeon: Evelina Bucy,  DPM;  Location: [place];  Service: Podiatry;  Laterality: Left;  . NO PAST SURGERIES      There were no vitals filed for this visit.  Subjective Assessment - 02/12/20 0843    Subjective  Was a little sore.  Has been doing her exercises.    Currently in Pain?  No/denies         [facility] Adult PT Treatment/Exercise - 02/12/20 0001      Knee/Hip Exercises: Aerobic   Elliptical  6 min L 10 ramp and L 10 resist.       Knee/Hip Exercises: Standing   Forward Lunges Limitations  front lunge and row 10 lbs KB on foam     Functional Squat Limitations  squat 10 lbs KB add knee lift for balance       Cryotherapy   Number Minutes Cryotherapy  6 Minutes    Cryotherapy Location  Ankle    Type of Cryotherapy  Ice pack      Manual Therapy   Soft tissue mobilization  calf, L achilles     Passive ROM  ankle     Kinesiotex  Create Space;Inhibit Muscle      Kinesiotix   Create Space  Achilles scar (transverse )     Inhibit Muscle   gastroc       Ankle Exercises: Standing   SLS  various activities for balance  with 5- 10 lbs weights shoulder press out  and small ROM rotation , very challenging              PT Education - 02/12/20 0922    Education Details  balance and stability    Person(s) Educated  Patient    Methods  Explanation;Demonstration    Comprehension  Verbalized understanding       PT Short Term Goals - 01/22/20 1228      PT SHORT TERM GOAL #1   Title  independent with initial HEP for L ankle AROM, strength    Status  Achieved      PT SHORT TERM GOAL #2   Title  Pt will be able to stand, walk in her home with pain min overall, least restrictive asst device as MD allows    Status  Achieved      PT SHORT TERM GOAL #3   Title  Pt will be able to perform SLS exercises on LLE, min dynamic for 30 sec to work toward full ankle function    Status  On-going      PT SHORT TERM GOAL #4   Title  Pt will note less swelling in LLE with normal daily  activities    Status  Partially Met        PT Long Term Goals - 01/22/20 1229      PT LONG TERM GOAL #1   Title  Pt will improve FOTO score to less than 35% impaired    Status  On-going      PT LONG TERM GOAL #2   Title  Pt will be able to squat 25 lbs  with no pain (min tension) in LLE for fitness, functional mobility    Status  On-going      PT LONG TERM GOAL #3   Title  Pt will be able to walk quickly up and down stairs multiple times without use of hands, increased confidence.    Status  On-going      PT LONG TERM GOAL #4   Title  independent with advanced HEP for LE    Status  On-going      PT LONG TERM GOAL #5   Title  Pt will perform dynamic balance activities in SLS without LOB or pain    Status  On-going            Plan - 02/12/20 1696    Clinical Impression Statement  Pt very unstable on LLE when dynamic activity added with light weights.  Better off the foam pad.  Used IASTM tool and tape for L ankle support and reduction of scar tissue buildup.  No pain post. Used ice post as she has 2 small pockets of swelling lateral and medial to scar.    PT Treatment/Interventions  ADLs/Self Care Home Management;Therapeutic activities;Patient/family education;Taping;Therapeutic exercise;Cryotherapy;Ultrasound;Moist Heat;Gait training;Stair training;Functional mobility training;Neuromuscular re-education;Manual techniques;Passive range of motion;Scar mobilization;Vasopneumatic Device    PT Next Visit Plan  SLS, step downs, Reformer for footwork, manual for scar tissue/IASTM and tape?    PT Home Exercise Plan  (ankle ROM, towel scrunch, eversion, seated PF ALL DC ), partial lunge, SLS, standing heel raise and calf stretching    Consulted and Agree with Plan of Care  Patient       Patient will benefit from skilled therapeutic intervention in order to improve the following deficits and impairments:  Abnormal gait, Decreased activity tolerance, Decreased balance, Difficulty  walking, Increased edema, Impaired flexibility, Decreased strength,  Decreased range of motion, Hypomobility, Postural dysfunction, Decreased scar mobility, Increased fascial restricitons, Pain, Impaired sensation  Visit Diagnosis: Difficulty in walking, not elsewhere classified  Muscle weakness (generalized)  Pain in left ankle and joints of left foot     Problem List There are no problems to display for this patient.   Cynthia Little 02/12/2020, 10:02 AM  [facility] [address] [place], [place], [address] Phone: [phone]   Fax:  [phone]  Name: Cynthia Little MRN: [id number] Date of Birth: [birth year]  Raeford Razor, PT 02/12/20 10:02 AM Phone: [phone] Fax: [phone]

## 2020-02-14 ENCOUNTER — Other Ambulatory Visit: Payer: Self-pay

## 2020-02-14 ENCOUNTER — Encounter: Payer: Self-pay | Admitting: Physical Therapy

## 2020-02-14 ENCOUNTER — Ambulatory Visit: Payer: No Typology Code available for payment source | Admitting: Physical Therapy

## 2020-02-14 DIAGNOSIS — M6281 Muscle weakness (generalized): Secondary | ICD-10-CM

## 2020-02-14 DIAGNOSIS — R262 Difficulty in walking, not elsewhere classified: Secondary | ICD-10-CM | POA: Diagnosis not present

## 2020-02-14 DIAGNOSIS — M25572 Pain in left ankle and joints of left foot: Secondary | ICD-10-CM

## 2020-02-14 NOTE — Therapy (Signed)
[place], [place], [address] Phone: [phone]   Fax:  [phone]  Physical Therapy Treatment  Patient Details  Name: Cynthia Little MRN: [id number] Date of Birth: [birth year] Referring Provider (PT): Dr. March Rummage    Encounter Date: 02/14/2020  PT End of Session - 02/14/20 0913    Visit Number  22    Number of Visits  31    Date for PT Re-Evaluation  03/05/20    Authorization Type  [place] - 15 more visits approved    Authorization - Visit Number  7    Authorization - Number of Visits  15    PT Start Time  0829    PT Stop Time  0913    PT Time Calculation (min)  44 min    Activity Tolerance  Patient tolerated treatment well    Behavior During Therapy  [facility] for tasks assessed/performed       Past Medical History:  Diagnosis Date  . Anemia   . Asthma   . Bronchitis     Past Surgical History:  Procedure Laterality Date  . CALCANEAL OSTEOTOMY Left 05/17/2019   Procedure: CALCANEAL OSTECTOMY;  Surgeon: Evelina Bucy, DPM;  Location: [place];  Service: Podiatry;  Laterality: Left;  . CESAREAN SECTION  08/11/2012   Procedure: CESAREAN SECTION;  Surgeon: Melina Schools, MD;  Location: [facility];  Service: Gynecology;  Laterality: N/A;  . CHEILECTOMY Left 05/17/2019   Procedure: CHEILECTOMY LEFT FOOT;  Surgeon: Evelina Bucy, DPM;  Location: [place];  Service: Podiatry;  Laterality: Left;  Marland Kitchen GASTROC RECESSION EXTREMITY Left 05/17/2019   Procedure: GASTROCNEMIUS RECESS;  Surgeon: Evelina Bucy, DPM;  Location: [place];  Service: Podiatry;  Laterality: Left;  . HAMMER TOE SURGERY Left 05/17/2019   Procedure: HAMMER TOE CORRECTION FIFTH;  Surgeon: Evelina Bucy, DPM;  Location: [place];  Service: Podiatry;  Laterality: Left;  . MINOR CLOSED MANIPULATION Left 05/17/2019   Procedure: MINOR CLOSED MANIPULATION;  Surgeon: Evelina Bucy,  DPM;  Location: [place];  Service: Podiatry;  Laterality: Left;  . NO PAST SURGERIES      There were no vitals filed for this visit.  Subjective Assessment - 02/14/20 0834    Subjective  L ankle was a littte puffy yesterday,  no pain really    Currently in Pain?  No/denies            [facility] Adult PT Treatment/Exercise - 02/14/20 0001      Knee/Hip Exercises: Aerobic   Elliptical  6 min L 10 ramp and L 10 resist.       Knee/Hip Exercises: Machines for Strengthening   Cybex Leg Press  double leg 2 plates x 20, single leg x 10 and then heel raises double and single leg    2 plates for double leg heel raise     Knee/Hip Exercises: Plyometrics   Unilateral Jumping Limitations  --      Manual Therapy   Soft tissue mobilization  calf, L achilles       Ankle Exercises: Standing   Rebounder  on foam LLE static and min dynamic (back toe touch and hold)     Warrior I  front lunge rebounder x10                PT Short Term Goals - 01/22/20 1228      PT SHORT TERM GOAL #  1   Title  independent with initial HEP for L ankle AROM, strength    Status  Achieved      PT SHORT TERM GOAL #2   Title  Pt will be able to stand, walk in her home with pain min overall, least restrictive asst device as MD allows    Status  Achieved      PT SHORT TERM GOAL #3   Title  Pt will be able to perform SLS exercises on LLE, min dynamic for 30 sec to work toward full ankle function    Status  On-going      PT SHORT TERM GOAL #4   Title  Pt will note less swelling in LLE with normal daily activities    Status  Partially Met        PT Long Term Goals - 01/22/20 1229      PT LONG TERM GOAL #1   Title  Pt will improve FOTO score to less than 35% impaired    Status  On-going      PT LONG TERM GOAL #2   Title  Pt will be able to squat 25 lbs  with no pain (min tension) in LLE for fitness, functional mobility    Status  On-going      PT LONG TERM GOAL #3   Title  Pt  will be able to walk quickly up and down stairs multiple times without use of hands, increased confidence.    Status  On-going      PT LONG TERM GOAL #4   Title  independent with advanced HEP for LE    Status  On-going      PT LONG TERM GOAL #5   Title  Pt will perform dynamic balance activities in SLS without LOB or pain    Status  On-going            Plan - 02/14/20 0914    Clinical Impression Statement  Pt had dfficulty with single leg heel raises.  DId well with more dynamic SLB activities.  Tightness relieve with stretching and scar tissue massage.    PT Treatment/Interventions  ADLs/Self Care Home Management;Therapeutic activities;Patient/family education;Taping;Therapeutic exercise;Cryotherapy;Ultrasound;Moist Heat;Gait training;Stair training;Functional mobility training;Neuromuscular re-education;Manual techniques;Passive range of motion;Scar mobilization;Vasopneumatic Device    PT Next Visit Plan  MD visit? SLS, step down , single leg heel raise, scar tissue and tape    PT Home Exercise Plan  (ankle ROM, towel scrunch, eversion, seated PF ALL DC ), partial lunge, SLS, standing heel raise and calf stretching, single leg heel raise    Consulted and Agree with Plan of Care  Patient       Patient will benefit from skilled therapeutic intervention in order to improve the following deficits and impairments:  Abnormal gait, Decreased activity tolerance, Decreased balance, Difficulty walking, Increased edema, Impaired flexibility, Decreased strength, Decreased range of motion, Hypomobility, Postural dysfunction, Decreased scar mobility, Increased fascial restricitons, Pain, Impaired sensation  Visit Diagnosis: Difficulty in walking, not elsewhere classified  Muscle weakness (generalized)  Pain in left ankle and joints of left foot     Problem List There are no problems to display for this patient.   Khloey Chern 02/14/2020, 9:16 AM  [facility] [address] [place], [place], [address] Phone: [phone]   Fax:  [phone]  Name: Cynthia Little MRN: [id number] Date of Birth: 08-12-1991 Raeford Razor, PT 02/14/20 9:16 AM Phone: [phone] Fax: [phone]

## 2020-02-15 ENCOUNTER — Ambulatory Visit: Payer: Self-pay | Admitting: Podiatry

## 2020-02-16 ENCOUNTER — Ambulatory Visit: Payer: Commercial Managed Care - PPO | Attending: Internal Medicine

## 2020-02-16 DIAGNOSIS — Z20822 Contact with and (suspected) exposure to covid-19: Secondary | ICD-10-CM

## 2020-02-17 LAB — NOVEL CORONAVIRUS, NAA: SARS-CoV-2, NAA: NOT DETECTED

## 2020-02-19 ENCOUNTER — Encounter: Payer: Self-pay | Admitting: Physical Therapy

## 2020-02-19 ENCOUNTER — Other Ambulatory Visit: Payer: Self-pay

## 2020-02-19 ENCOUNTER — Ambulatory Visit: Payer: No Typology Code available for payment source | Attending: Podiatry | Admitting: Physical Therapy

## 2020-02-19 DIAGNOSIS — M25572 Pain in left ankle and joints of left foot: Secondary | ICD-10-CM | POA: Diagnosis present

## 2020-02-19 DIAGNOSIS — R262 Difficulty in walking, not elsewhere classified: Secondary | ICD-10-CM | POA: Insufficient documentation

## 2020-02-19 DIAGNOSIS — M25561 Pain in right knee: Secondary | ICD-10-CM | POA: Diagnosis present

## 2020-02-19 DIAGNOSIS — M6281 Muscle weakness (generalized): Secondary | ICD-10-CM | POA: Diagnosis present

## 2020-02-19 DIAGNOSIS — G8929 Other chronic pain: Secondary | ICD-10-CM | POA: Insufficient documentation

## 2020-02-19 NOTE — Therapy (Signed)
[place], [place], [address] Phone: [phone]   Fax:  [phone]  Physical Therapy Treatment  Patient Details  Name: Cynthia Little MRN: [id number] Date of Birth: November 17, 1991 Referring Provider (PT): Dr. March Rummage    Encounter Date: 02/19/2020  PT End of Session - 02/19/20 0805    Visit Number  23    Number of Visits  31    Date for PT Re-Evaluation  03/05/20    Authorization Type  VA - 15 more visits approved    Authorization - Visit Number  8    Authorization - Number of Visits  15    PT Start Time  0755   arr 10 min late   PT Stop Time  0828    PT Time Calculation (min)  33 min    Activity Tolerance  Patient tolerated treatment well    Behavior During Therapy  [facility] for tasks assessed/performed       Past Medical History:  Diagnosis Date  . Anemia   . Asthma   . Bronchitis     Past Surgical History:  Procedure Laterality Date  . CALCANEAL OSTEOTOMY Left 05/17/2019   Procedure: CALCANEAL OSTECTOMY;  Surgeon: Evelina Bucy, DPM;  Location: [place];  Service: Podiatry;  Laterality: Left;  . CESAREAN SECTION  08/11/2012   Procedure: CESAREAN SECTION;  Surgeon: Melina Schools, MD;  Location: [facility];  Service: Gynecology;  Laterality: N/A;  . CHEILECTOMY Left 05/17/2019   Procedure: CHEILECTOMY LEFT FOOT;  Surgeon: Evelina Bucy, DPM;  Location: [place];  Service: Podiatry;  Laterality: Left;  Marland Kitchen GASTROC RECESSION EXTREMITY Left 05/17/2019   Procedure: GASTROCNEMIUS RECESS;  Surgeon: Evelina Bucy, DPM;  Location: [place];  Service: Podiatry;  Laterality: Left;  . HAMMER TOE SURGERY Left 05/17/2019   Procedure: HAMMER TOE CORRECTION FIFTH;  Surgeon: Evelina Bucy, DPM;  Location: [place];  Service: Podiatry;  Laterality: Left;  . MINOR CLOSED MANIPULATION Left 05/17/2019   Procedure: MINOR CLOSED MANIPULATION;  Surgeon:  Evelina Bucy, DPM;  Location: [place];  Service: Podiatry;  Laterality: Left;  . NO PAST SURGERIES      There were no vitals filed for this visit.  Subjective Assessment - 02/19/20 0803    Subjective  No pain.  Leaves for [place] Thursday. Arrives late    Currently in Pain?  No/denies         [facility] Adult PT Treatment/Exercise - 02/19/20 0001      Knee/Hip Exercises: Stretches   Gastroc Stretch  Both;5 reps    Gastroc Stretch Limitations  slant board     Other Knee/Hip Stretches  lunge for ankle DF mobility lunge on high table       Manual Therapy   Manual therapy comments  scar tissue massage , XFF    Soft tissue mobilization  calf, L achilles     Passive ROM  ankle DF    Kinesiotex  Barrister's clerk;Inhibit Muscle      Kinesiotix   Create Space  transverse at scar    Inhibit Muscle   longitudinal gastroc with 25% stretch to heel      Ankle Exercises: Standing   Rocker Board  5 minutes    Balance Master: Limits for Stability  worked on medial/lateral stability and A/P with light UE exercises , controlled stability in both planes  PT Short Term Goals - 01/22/20 1228      PT SHORT TERM GOAL #1   Title  independent with initial HEP for L ankle AROM, strength    Status  Achieved      PT SHORT TERM GOAL #2   Title  Pt will be able to stand, walk in her home with pain min overall, least restrictive asst device as MD allows    Status  Achieved      PT SHORT TERM GOAL #3   Title  Pt will be able to perform SLS exercises on LLE, min dynamic for 30 sec to work toward full ankle function    Status  On-going      PT SHORT TERM GOAL #4   Title  Pt will note less swelling in LLE with normal daily activities    Status  Partially Met        PT Long Term Goals - 01/22/20 1229      PT LONG TERM GOAL #1   Title  Pt will improve FOTO score to less than 35% impaired    Status  On-going      PT LONG TERM GOAL #2   Title  Pt will be  able to squat 25 lbs  with no pain (min tension) in LLE for fitness, functional mobility    Status  On-going      PT LONG TERM GOAL #3   Title  Pt will be able to walk quickly up and down stairs multiple times without use of hands, increased confidence.    Status  On-going      PT LONG TERM GOAL #4   Title  independent with advanced HEP for LE    Status  On-going      PT LONG TERM GOAL #5   Title  Pt will perform dynamic balance activities in SLS without LOB or pain    Status  On-going            Plan - 02/19/20 0841    Clinical Impression Statement  Shortened session due to late arrival.  Worked on controlling balance and weight shifts in A/P and M/L directions.  Re-taped for L calf and scar.    PT Treatment/Interventions  ADLs/Self Care Home Management;Therapeutic activities;Patient/family education;Taping;Therapeutic exercise;Cryotherapy;Ultrasound;Moist Heat;Gait training;Stair training;Functional mobility training;Neuromuscular re-education;Manual techniques;Passive range of motion;Scar mobilization;Vasopneumatic Device    PT Next Visit Plan  MD visit? SLS, step down , single leg heel raise, scar tissue and tape    PT Home Exercise Plan  (ankle ROM, towel scrunch, eversion, seated PF ALL DC ), partial lunge, SLS, standing heel raise and calf stretching, single leg heel raise    Consulted and Agree with Plan of Care  Patient       Patient will benefit from skilled therapeutic intervention in order to improve the following deficits and impairments:  Abnormal gait, Decreased activity tolerance, Decreased balance, Difficulty walking, Increased edema, Impaired flexibility, Decreased strength, Decreased range of motion, Hypomobility, Postural dysfunction, Decreased scar mobility, Increased fascial restricitons, Pain, Impaired sensation  Visit Diagnosis: Difficulty in walking, not elsewhere classified  Muscle weakness (generalized)  Pain in left ankle and joints of left  foot  Chronic pain of right knee     Problem List There are no problems to display for this patient.   Cynthia Little 02/19/2020, 8:44 AM  [facility] [address] [place], [place], [address] Phone: [phone]   Fax:  [phone]  Name: Cynthia Little  MRN: [id number] Date of Birth: 15-Sep-1991  Raeford Razor, PT 02/19/20 8:45 AM Phone: [phone] Fax: [phone]

## 2020-02-21 ENCOUNTER — Other Ambulatory Visit: Payer: Self-pay

## 2020-02-21 ENCOUNTER — Encounter: Payer: Self-pay | Admitting: Physical Therapy

## 2020-02-21 ENCOUNTER — Ambulatory Visit: Payer: No Typology Code available for payment source | Admitting: Physical Therapy

## 2020-02-21 DIAGNOSIS — M6281 Muscle weakness (generalized): Secondary | ICD-10-CM

## 2020-02-21 DIAGNOSIS — R262 Difficulty in walking, not elsewhere classified: Secondary | ICD-10-CM | POA: Diagnosis not present

## 2020-02-21 DIAGNOSIS — M25572 Pain in left ankle and joints of left foot: Secondary | ICD-10-CM

## 2020-02-21 NOTE — Therapy (Signed)
[place], [place], [address] Phone: [phone]   Fax:  [phone]  Physical Therapy Treatment  Patient Details  Name: Cynthia Little MRN: [id number] Date of Birth: 1991-08-12 Referring Provider (PT): Dr. March Rummage    Encounter Date: 02/21/2020  PT End of Session - 02/21/20 0853    Visit Number  24    Number of Visits  31    Date for PT Re-Evaluation  03/05/20    PT Start Time  0837    PT Stop Time  0920    PT Time Calculation (min)  43 min    Activity Tolerance  Patient tolerated treatment well    Behavior During Therapy  [facility] for tasks assessed/performed       Past Medical History:  Diagnosis Date  . Anemia   . Asthma   . Bronchitis     Past Surgical History:  Procedure Laterality Date  . CALCANEAL OSTEOTOMY Left 05/17/2019   Procedure: CALCANEAL OSTECTOMY;  Surgeon: Evelina Bucy, DPM;  Location: [place];  Service: Podiatry;  Laterality: Left;  . CESAREAN SECTION  08/11/2012   Procedure: CESAREAN SECTION;  Surgeon: Melina Schools, MD;  Location: [facility];  Service: Gynecology;  Laterality: N/A;  . CHEILECTOMY Left 05/17/2019   Procedure: CHEILECTOMY LEFT FOOT;  Surgeon: Evelina Bucy, DPM;  Location: [place];  Service: Podiatry;  Laterality: Left;  Marland Kitchen GASTROC RECESSION EXTREMITY Left 05/17/2019   Procedure: GASTROCNEMIUS RECESS;  Surgeon: Evelina Bucy, DPM;  Location: [place];  Service: Podiatry;  Laterality: Left;  . HAMMER TOE SURGERY Left 05/17/2019   Procedure: HAMMER TOE CORRECTION FIFTH;  Surgeon: Evelina Bucy, DPM;  Location: [place];  Service: Podiatry;  Laterality: Left;  . MINOR CLOSED MANIPULATION Left 05/17/2019   Procedure: MINOR CLOSED MANIPULATION;  Surgeon: Evelina Bucy, DPM;  Location: [place];  Service: Podiatry;  Laterality: Left;  . NO PAST SURGERIES      There were no vitals  filed for this visit.  Subjective Assessment - 02/21/20 0849    Subjective  Mild pain L lateral ankle, same place.    Currently in Pain?  Yes    Pain Score  1     Pain Location  Ankle    Pain Orientation  Left    Pain Descriptors / Indicators  Tightness    Pain Type  Chronic pain    Pain Onset  More than a month ago    Pain Frequency  Intermittent    Aggravating Factors   overactivity    Pain Relieving Factors  rest, stretch, massage            [facility] Adult PT Treatment/Exercise - 02/21/20 0001      Knee/Hip Exercises: Aerobic   Elliptical  5 min L 5-10 ramp, L 5-10 resistance       Knee/Hip Exercises: Machines for Strengthening   Cybex Leg Press  1 plate heel raises x 20 , min pain  double leg    single leg very challenging for pt.      Knee/Hip Exercises: Standing   Forward Step Up  Both;1 set;20 reps;Hand Hold: 0;Step Height: 8"    Step Down  Left;1 set;15 reps    Step Down Limitations  min to no UE support, smaller rom when needed       Manual Therapy   Passive ROM  ankle DF    Kinesiotex  Barrister's clerk;Inhibit Muscle      Kinesiotix   Create Space  transverse at scar    Inhibit Muscle   longitudinal gastroc with 25% stretch to heel      Ankle Exercises: Stretches   Plantar Fascia Stretch  3 reps;30 seconds      Ankle Exercises: Standing   Heel Raises  Both;10 reps    Heel Walk (Round Trip)  off step, control with L       Ankle Exercises: Seated   Heel Raises  Both;15 reps   15 lbs KB    Other Seated Ankle Exercises  black band circles, x 10 , and PF black band x 20                PT Short Term Goals - 01/22/20 1228      PT SHORT TERM GOAL #1   Title  independent with initial HEP for L ankle AROM, strength    Status  Achieved      PT SHORT TERM GOAL #2   Title  Pt will be able to stand, walk in her home with pain min overall, least restrictive asst device as MD allows    Status  Achieved      PT SHORT TERM GOAL #3   Title  Pt will be able  to perform SLS exercises on LLE, min dynamic for 30 sec to work toward full ankle function    Status  On-going      PT SHORT TERM GOAL #4   Title  Pt will note less swelling in LLE with normal daily activities    Status  Partially Met        PT Long Term Goals - 01/22/20 1229      PT LONG TERM GOAL #1   Title  Pt will improve FOTO score to less than 35% impaired    Status  On-going      PT LONG TERM GOAL #2   Title  Pt will be able to squat 25 lbs  with no pain (min tension) in LLE for fitness, functional mobility    Status  On-going      PT LONG TERM GOAL #3   Title  Pt will be able to walk quickly up and down stairs multiple times without use of hands, increased confidence.    Status  On-going      PT LONG TERM GOAL #4   Title  independent with advanced HEP for LE    Status  On-going      PT LONG TERM GOAL #5   Title  Pt will perform dynamic balance activities in SLS without LOB or pain    Status  On-going            Plan - 02/21/20 0939    Clinical Impression Statement  Pt able to "run" up and down steps without difficulty no UE support.  Worked on heel raises, single and double leg.  Quite difficult for patient to do (shoes off).  Retaped and gave her 2 strips for her to take on her trip.    PT Treatment/Interventions  ADLs/Self Care Home Management;Therapeutic activities;Patient/family education;Taping;Therapeutic exercise;Cryotherapy;Ultrasound;Moist Heat;Gait training;Stair training;Functional mobility training;Neuromuscular re-education;Manual techniques;Passive range of motion;Scar mobilization;Vasopneumatic Device    PT Next Visit Plan  SLS, step down , single leg heel raise, scar tissue and tape, hopping, jumping?    PT Home Exercise Plan  (ankle ROM, towel scrunch, eversion, seated PF ALL DC ), partial lunge, , black  band PF, SLS, standing heel raise and calf stretching, single leg heel raise    Consulted and Agree with Plan of Care  Patient       Patient  will benefit from skilled therapeutic intervention in order to improve the following deficits and impairments:  Abnormal gait, Decreased activity tolerance, Decreased balance, Difficulty walking, Increased edema, Impaired flexibility, Decreased strength, Decreased range of motion, Hypomobility, Postural dysfunction, Decreased scar mobility, Increased fascial restricitons, Pain, Impaired sensation  Visit Diagnosis: Difficulty in walking, not elsewhere classified  Muscle weakness (generalized)  Pain in left ankle and joints of left foot     Problem List There are no problems to display for this patient.   Michaeljoseph Revolorio 02/21/2020, 9:46 AM  [facility] [address] [place], [place], [address] Phone: [phone]   Fax:  [phone]  Name: Cynthia Little MRN: [id number] Date of Birth: [birth year]  Raeford Razor, PT 02/21/20 9:46 AM Phone: [phone] Fax: [phone]

## 2020-02-28 ENCOUNTER — Ambulatory Visit: Payer: No Typology Code available for payment source | Admitting: Physical Therapy

## 2020-02-28 ENCOUNTER — Encounter: Payer: Self-pay | Admitting: Physical Therapy

## 2020-02-28 ENCOUNTER — Other Ambulatory Visit: Payer: Self-pay

## 2020-02-28 DIAGNOSIS — M6281 Muscle weakness (generalized): Secondary | ICD-10-CM

## 2020-02-28 DIAGNOSIS — R262 Difficulty in walking, not elsewhere classified: Secondary | ICD-10-CM | POA: Diagnosis not present

## 2020-02-28 DIAGNOSIS — M25572 Pain in left ankle and joints of left foot: Secondary | ICD-10-CM

## 2020-02-28 NOTE — Therapy (Addendum)
[place], [place], [address] Phone: [phone]   Fax:  [phone]  Physical Therapy Treatment/Discharge  Patient Details  Name: Cynthia Little MRN: [id number] Date of Birth: 1991-09-15 Referring Provider (PT): Dr. March Rummage    Encounter Date: 02/28/2020  PT End of Session - 02/28/20 0940    Visit Number  25    Number of Visits  31    Date for PT Re-Evaluation  03/05/20    Authorization Type  [facility] - 15 more visits approved    Authorization - Visit Number  9    Authorization - Number of Visits  15    PT Start Time  0831    PT Stop Time  0856    PT Time Calculation (min)  25 min    Activity Tolerance  Patient tolerated treatment well    Behavior During Therapy  [facility] for tasks assessed/performed       Past Medical History:  Diagnosis Date  . Anemia   . Asthma   . Bronchitis     Past Surgical History:  Procedure Laterality Date  . CALCANEAL OSTEOTOMY Left 05/17/2019   Procedure: CALCANEAL OSTECTOMY;  Surgeon: Evelina Bucy, DPM;  Location: [place];  Service: Podiatry;  Laterality: Left;  . CESAREAN SECTION  08/11/2012   Procedure: CESAREAN SECTION;  Surgeon: Melina Schools, MD;  Location: [facility];  Service: Gynecology;  Laterality: N/A;  . CHEILECTOMY Left 05/17/2019   Procedure: CHEILECTOMY LEFT FOOT;  Surgeon: Evelina Bucy, DPM;  Location: [place];  Service: Podiatry;  Laterality: Left;  Marland Kitchen GASTROC RECESSION EXTREMITY Left 05/17/2019   Procedure: GASTROCNEMIUS RECESS;  Surgeon: Evelina Bucy, DPM;  Location: [place];  Service: Podiatry;  Laterality: Left;  . HAMMER TOE SURGERY Left 05/17/2019   Procedure: HAMMER TOE CORRECTION FIFTH;  Surgeon: Evelina Bucy, DPM;  Location: [place];  Service: Podiatry;  Laterality: Left;  . MINOR CLOSED MANIPULATION Left 05/17/2019   Procedure: MINOR CLOSED MANIPULATION;  Surgeon: Evelina Bucy, DPM;  Location: [place];  Service: Podiatry;  Laterality: Left;  . NO PAST SURGERIES      There were no vitals filed for this visit.  Subjective Assessment - 02/28/20 0935    Subjective  Back from [place].  No problems, I have to leave at 8:55.    Currently in Pain?  No/denies           [facility] Adult PT Treatment/Exercise - 02/28/20 0001      Manual Therapy   Manual therapy comments  XFF scar     Soft tissue mobilization  scar tissue, Achilles, gastrocsoleus  IASTM     Passive ROM  ankle DF      Ankle Exercises: Aerobic   Elliptical  5 min L5 resist, 10 ramp      Ankle Exercises: Stretches   Plantar Fascia Stretch  5 reps;30 seconds    Other Stretch  doubel and single , off step, 3 inch then 6 inch.       Ankle Exercises: Standing   Heel Raises  Both;20 reps   off step, then  LLE with knee sl flexed, x 20 toe on step              PT Short Term Goals - 01/22/20 1228      PT SHORT TERM GOAL #1   Title  independent with initial HEP for L ankle  AROM, strength    Status  Achieved      PT SHORT TERM GOAL #2   Title  Pt will be able to stand, walk in her home with pain min overall, least restrictive asst device as MD allows    Status  Achieved      PT SHORT TERM GOAL #3   Title  Pt will be able to perform SLS exercises on LLE, min dynamic for 30 sec to work toward full ankle function    Status  On-going      PT SHORT TERM GOAL #4   Title  Pt will note less swelling in LLE with normal daily activities    Status  Partially Met        PT Long Term Goals - 01/22/20 1229      PT LONG TERM GOAL #1   Title  Pt will improve FOTO score to less than 35% impaired    Status  On-going      PT LONG TERM GOAL #2   Title  Pt will be able to squat 25 lbs  with no pain (min tension) in LLE for fitness, functional mobility    Status  On-going      PT LONG TERM GOAL #3   Title  Pt will be able to walk quickly up and down stairs multiple times  without use of hands, increased confidence.    Status  On-going      PT LONG TERM GOAL #4   Title  independent with advanced HEP for LE    Status  On-going      PT LONG TERM GOAL #5   Title  Pt will perform dynamic balance activities in SLS without LOB or pain    Status  On-going            Plan - 02/28/20 0940    Clinical Impression Statement  Shortened session due to pt request.  Intensive stretching and ankle strengthening with toes elevated for greater ROM.    PT Treatment/Interventions  ADLs/Self Care Home Management;Therapeutic activities;Patient/family education;Taping;Therapeutic exercise;Cryotherapy;Ultrasound;Moist Heat;Gait training;Stair training;Functional mobility training;Neuromuscular re-education;Manual techniques;Passive range of motion;Scar mobilization;Vasopneumatic Device    PT Next Visit Plan  SLS, step down , single leg heel raise, scar tissue and tape, hopping, jumping? This was her last appt, needs 5-6 more.    PT Home Exercise Plan  (ankle ROM, towel scrunch, eversion, seated PF ALL DC ), partial lunge, , black band PF, SLS, standing heel raise and calf stretching, single leg heel raise    Consulted and Agree with Plan of Care  Patient       Patient will benefit from skilled therapeutic intervention in order to improve the following deficits and impairments:  Abnormal gait, Decreased activity tolerance, Decreased balance, Difficulty walking, Increased edema, Impaired flexibility, Decreased strength, Decreased range of motion, Hypomobility, Postural dysfunction, Decreased scar mobility, Increased fascial restricitons, Pain, Impaired sensation  Visit Diagnosis: Difficulty in walking, not elsewhere classified  Muscle weakness (generalized)  Pain in left ankle and joints of left foot     Problem List There are no problems to display for this patient.   Talley Casco 02/28/2020, 9:42 AM  [facility] [address] [place], [place], [address] Phone: [phone]   Fax:  [phone]  Name: Cynthia Little MRN: [id number] Date of Birth: [birth year]   Raeford Razor, PT 02/28/20 9:43 AM Phone: [phone] Fax: [phone]  PHYSICAL THERAPY DISCHARGE SUMMARY  Visits from Start of Care: 25  Current functional level related to goals / functional outcomes: Unknown, see above for most recent info    Remaining deficits: Unknown, was progressing with endurance, stability and strength.  Lacked PF strength as of last visit    Education / Equipment: HEP, gait, posture, RICE   Plan: Patient agrees to discharge.  Patient goals were partially met. Patient is being discharged due to not returning since the last visit.  ?????    Raeford Razor, PT 04/23/20 8:23 AM Phone: [phone] Fax: [phone]

## 2020-03-14 ENCOUNTER — Ambulatory Visit: Payer: Commercial Managed Care - PPO | Attending: Internal Medicine

## 2020-03-14 DIAGNOSIS — Z23 Encounter for immunization: Secondary | ICD-10-CM

## 2020-03-14 NOTE — Progress Notes (Signed)
   Covid-19 Vaccination Clinic  Name:  Cynthia Little    MRN: [id number] DOB: 09-06-1991  03/14/2020  [name] was observed post Covid-19 immunization for 30 minutes based on pre-vaccination screening without incident. She was provided with Vaccine Information Sheet and instruction to access the V-Safe system.   [name] was instructed to call 911 with any severe reactions post vaccine: Marland Kitchen Difficulty breathing  . Swelling of face and throat  . A fast heartbeat  . A bad rash all over body  . Dizziness and weakness   Immunizations Administered    Name Date Dose VIS Date Route   Pfizer COVID-19 Vaccine 03/14/2020  2:24 PM 0.3 mL 12/01/2019 Intramuscular   Manufacturer: [place]   Lot: [id number]   [place]: [id number]

## 2020-04-09 ENCOUNTER — Ambulatory Visit: Payer: Commercial Managed Care - PPO | Attending: Internal Medicine

## 2020-04-09 DIAGNOSIS — Z23 Encounter for immunization: Secondary | ICD-10-CM

## 2020-04-09 NOTE — Progress Notes (Signed)
   Covid-19 Vaccination Clinic  Name:  Cynthia Little    MRN: [id number] DOB: 1991-11-05  04/09/2020  [name] was observed post Covid-19 immunization for 15 minutes without incident. She was provided with Vaccine Information Sheet and instruction to access the V-Safe system.   [name] was instructed to call 911 with any severe reactions post vaccine: Marland Kitchen Difficulty breathing  . Swelling of face and throat  . A fast heartbeat  . A bad rash all over body  . Dizziness and weakness   Immunizations Administered    Name Date Dose VIS Date Route   Pfizer COVID-19 Vaccine 04/09/2020  8:04 AM 0.3 mL 02/14/2019 Intramuscular   Manufacturer: [place]   Lot: [id number]   [place]: [id number]

## 2020-06-17 ENCOUNTER — Ambulatory Visit: Payer: No Typology Code available for payment source | Attending: Podiatry | Admitting: Physical Therapy

## 2020-06-17 ENCOUNTER — Encounter: Payer: Self-pay | Admitting: Physical Therapy

## 2020-06-17 ENCOUNTER — Other Ambulatory Visit: Payer: Self-pay

## 2020-06-17 DIAGNOSIS — M25672 Stiffness of left ankle, not elsewhere classified: Secondary | ICD-10-CM

## 2020-06-17 DIAGNOSIS — R262 Difficulty in walking, not elsewhere classified: Secondary | ICD-10-CM | POA: Insufficient documentation

## 2020-06-17 DIAGNOSIS — M6281 Muscle weakness (generalized): Secondary | ICD-10-CM | POA: Insufficient documentation

## 2020-06-17 DIAGNOSIS — M25572 Pain in left ankle and joints of left foot: Secondary | ICD-10-CM | POA: Insufficient documentation

## 2020-06-17 NOTE — Patient Instructions (Signed)
Access Code: [email or web address]: [email or web address]: 06/28/2021Prepared by: Anderson Malta PaaExercises  Lateral Step Up - 1 x daily - 7 x weekly - 2 sets - 10 reps  Forward Step Up - 1 x daily - 7 x weekly - 2 sets - 10 reps  Runner's Step Up/Down - 1 x daily - 7 x weekly - 2 sets - 10 reps  Forward Step Down - 1 x daily - 7 x weekly - 2 sets - 10 reps  Half Kneeling Dorsiflexion Stretch at Wall - 1 x daily - 7 x weekly - 1 sets - 10 reps - 10 hold

## 2020-06-18 NOTE — Therapy (Signed)
[place], [place], [address] Phone: [phone]   Fax:  [phone]  Physical Therapy Evaluation  Patient Details  Name: Cynthia Little MRN: [id number] Date of Birth: 04-07-1991 Referring Provider (PT): Sherral Hammers, NP    Encounter Date: 06/17/2020   PT End of Session - 06/18/20 6720    Visit Number 1    Number of Visits 6    Date for PT Re-Evaluation 07/30/20    Authorization Type VA- 6 visits approved    Authorization - Visit Number 1    Authorization - Number of Visits 6    PT Start Time 1310    PT Stop Time 1345    PT Time Calculation (min) 35 min    Activity Tolerance Patient tolerated treatment well    Behavior During Therapy [facility] for tasks assessed/performed           Past Medical History:  Diagnosis Date  . Anemia   . Asthma   . Bronchitis     Past Surgical History:  Procedure Laterality Date  . CALCANEAL OSTEOTOMY Left 05/17/2019   Procedure: CALCANEAL OSTECTOMY;  Surgeon: Evelina Bucy, DPM;  Location: [place];  Service: Podiatry;  Laterality: Left;  . CESAREAN SECTION  08/11/2012   Procedure: CESAREAN SECTION;  Surgeon: Melina Schools, MD;  Location: [facility];  Service: Gynecology;  Laterality: N/A;  . CHEILECTOMY Left 05/17/2019   Procedure: CHEILECTOMY LEFT FOOT;  Surgeon: Evelina Bucy, DPM;  Location: [place];  Service: Podiatry;  Laterality: Left;  Marland Kitchen GASTROC RECESSION EXTREMITY Left 05/17/2019   Procedure: GASTROCNEMIUS RECESS;  Surgeon: Evelina Bucy, DPM;  Location: [place];  Service: Podiatry;  Laterality: Left;  . HAMMER TOE SURGERY Left 05/17/2019   Procedure: HAMMER TOE CORRECTION FIFTH;  Surgeon: Evelina Bucy, DPM;  Location: [place];  Service: Podiatry;  Laterality: Left;  . MINOR CLOSED MANIPULATION Left 05/17/2019   Procedure: MINOR CLOSED MANIPULATION;  Surgeon: Evelina Bucy, DPM;  Location: [place];  Service: Podiatry;  Laterality: Left;  . NO PAST SURGERIES      There were no vitals filed for this visit.    Subjective Assessment - 06/17/20 1313    Subjective Returns for L ankle pain.  She has a catch in her ankle at times.  She has stiffness intermittently with prolonged sitting.  Is noticing a limp a little more than previous. Hard to do yardwork.    Pertinent History bone spurs bil heels, bil knee pain    Limitations Sitting;Walking;Lifting;House hold activities;Standing    Diagnostic tests Cheilectomy Left foot, Calcaneal ostectomy with heel spur resection, left 5th hammertoe 5th repair, gastroc recession.    Patient Stated Goals To strengthen it.  Stand on my toes    Currently in Pain? Yes    Pain Score 3     Pain Location Ankle    Pain Orientation Left   lateral , medial and also bottom of foot   Pain Descriptors / Indicators Tightness    Pain Type Chronic pain    Pain Onset More than a month ago    Pain Frequency Intermittent    Aggravating Factors  overactivity    Pain Relieving Factors rest, stretching, massaging it              [facility] PT Assessment - 06/18/20 0001      Assessment   Medical Diagnosis L ankle  pain    Referring Provider (PT) Sherral Hammers, NP     Onset Date/Surgical Date 05/17/19    Prior Therapy Yes       Precautions   Precautions None      Restrictions   Weight Bearing Restrictions No      Home Environment   Living Environment Private residence    Living Arrangements Children    Additional Comments one level; 1/2 step to enter;      Prior Function   Level of Independence Independent    Vocation Full time employment    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    Leisure walking, running, likes being outdoors , son is 7      Cognition   Overall Cognitive Status Within Functional Limits for tasks assessed      Observation/Other Assessments   Focus  on Therapeutic Outcomes (FOTO)  67%      Figure 8 Edema   Figure 8 - Right  20 1/2 inch     Figure 8 - Left  20 5/8 inch       Squat   Comments no pain, heel lift on L       Single Leg Squat   Comments cues , min difficulty       Single Leg Stance   Comments increased difficulty LLE > 20 sec       Strength   Left Ankle Dorsiflexion 4+/5    Left Ankle Plantar Flexion 4/5    Left Ankle Inversion 5/5    Left Ankle Eversion 5/5      Palpation   Palpation comment scar along achilles tight and elevated, irregular. Pocket of edema post to lateral malleolus            Objective measurements completed on examination: See above findings.      PT Education - 06/18/20 [phone]    Education Details ankle mobility , HEP, PT and POC    Person(s) Educated Patient    Methods Explanation;Demonstration;Handout    Comprehension Verbalized understanding;Returned demonstration;Verbal cues required               PT Long Term Goals - 06/18/20 0641      PT LONG TERM GOAL #1   Title Pt will improve FOTO score to less than 40% impaired    Baseline 67%    Time 6    Period Weeks    Status New    Target Date 07/29/20      PT LONG TERM GOAL #2   Title Pt will be able to squat 25 lbs  with no pain (min tension) in LLE for fitness, functional mobility    Time 6    Period Weeks    Status New    Target Date 07/29/20      PT LONG TERM GOAL #3   Title Pt will be able to walk quickly up and down stairs multiple times without use of hands, increased confidence.    Time 6    Period Weeks    Status New    Target Date 07/29/20      PT LONG TERM GOAL #4   Title independent with advanced HEP for LE    Time 6    Period Weeks    Status New    Target Date 07/29/20      PT LONG TERM GOAL #5   Title Pt will be able to walk/jog intervals for 30 min ,  3 x per week without increased pain in ankle/foot.    Baseline inconsistent    Time 6    Period Weeks    Status New    Target Date 07/29/20                   Plan - 06/18/20 0644    Clinical Impression Statement Patient returns for continued issues in L ankle/foot after surgery 04/2019.  She continues to have tightness and poor scar mobility which is limiting her ability to walk, jog and do yardwork.  Swelling does not fluctuate but is present. She is unable to do a single leg calf raise effectively. She has lost AROM in ankle since her last visit many months ago. She will be benefit from PT for intensive stretching and strengthening program.    Personal Factors and Comorbidities Time since onset of injury/illness/exacerbation    Examination-Activity Limitations Bathing;Squat;Stairs;Lift;Bed Mobility;Stand;Carry;Transfers;Sit    Examination-Participation Restrictions Laundry;Shop;Cleaning;Community Activity;Yard Work;Interpersonal Relationship    Stability/Clinical Decision Making Evolving/Moderate complexity    Clinical Decision Making Moderate    Rehab Potential Excellent    PT Frequency 2x / week    PT Duration 6 weeks   6 visits, will miss 1-2 weeks for trip   PT Treatment/Interventions ADLs/Self Care Home Management;Therapeutic activities;Patient/family education;Taping;Therapeutic exercise;Cryotherapy;Ultrasound;Moist Heat;Gait training;Stair training;Functional mobility training;Neuromuscular re-education;Manual techniques;Passive range of motion;Scar mobilization;Vasopneumatic Device;Electrical Stimulation;Dry needling    PT Next Visit Plan SLS, step down , single leg heel raise, scar tissue and tape, hopping, jumping?    PT Home Exercise Plan step ups, downs, SLS and 1/2 kneeling DF near wall    Consulted and Agree with Plan of Care Patient           Patient will benefit from skilled therapeutic intervention in order to improve the following deficits and impairments:  Abnormal gait, Decreased activity tolerance, Decreased balance, Difficulty walking, Increased edema, Impaired flexibility, Decreased strength,  Decreased range of motion, Hypomobility, Postural dysfunction, Decreased scar mobility, Increased fascial restricitons, Pain  Visit Diagnosis: Difficulty in walking, not elsewhere classified  Pain in left ankle and joints of left foot  Muscle weakness (generalized)  Stiffness of left ankle, not elsewhere classified     Problem List There are no problems to display for this patient.   Min Collymore 06/18/2020, 6:50 AM  [facility] [address] [place], [place], [address] Phone: [phone]   Fax:  [phone]  Name: Cynthia Little MRN: [id number] Date of Birth: 1991/11/09   Raeford Razor, PT 06/18/20 12:38 PM Phone: [phone] Fax: [phone]

## 2020-06-19 ENCOUNTER — Other Ambulatory Visit: Payer: Self-pay

## 2020-06-19 ENCOUNTER — Encounter: Payer: Self-pay | Admitting: Physical Therapy

## 2020-06-19 ENCOUNTER — Ambulatory Visit: Payer: No Typology Code available for payment source | Admitting: Physical Therapy

## 2020-06-19 DIAGNOSIS — M25672 Stiffness of left ankle, not elsewhere classified: Secondary | ICD-10-CM

## 2020-06-19 DIAGNOSIS — R262 Difficulty in walking, not elsewhere classified: Secondary | ICD-10-CM | POA: Diagnosis not present

## 2020-06-19 DIAGNOSIS — M25572 Pain in left ankle and joints of left foot: Secondary | ICD-10-CM

## 2020-06-19 DIAGNOSIS — M6281 Muscle weakness (generalized): Secondary | ICD-10-CM

## 2020-06-19 NOTE — Therapy (Signed)
[place], [place], [address] Phone: [phone]   Fax:  [phone]  Physical Therapy Treatment  Patient Details  Name: Cynthia Little MRN: [id number] Date of Birth: Jun 02, 1991 Referring Provider (PT): Sherral Hammers, NP    Encounter Date: 06/19/2020   PT End of Session - 06/19/20 0758    Visit Number 2    Number of Visits 7    Date for PT Re-Evaluation 07/30/20    Authorization Type VA- 6 visits approved    Authorization - Visit Number 2    Authorization - Number of Visits 7    PT Start Time 0746    PT Stop Time 0834    PT Time Calculation (min) 48 min    Activity Tolerance Patient tolerated treatment well    Behavior During Therapy [facility] for tasks assessed/performed           Past Medical History:  Diagnosis Date  . Anemia   . Asthma   . Bronchitis     Past Surgical History:  Procedure Laterality Date  . CALCANEAL OSTEOTOMY Left 05/17/2019   Procedure: CALCANEAL OSTECTOMY;  Surgeon: Evelina Bucy, DPM;  Location: [place];  Service: Podiatry;  Laterality: Left;  . CESAREAN SECTION  08/11/2012   Procedure: CESAREAN SECTION;  Surgeon: Melina Schools, MD;  Location: [facility];  Service: Gynecology;  Laterality: N/A;  . CHEILECTOMY Left 05/17/2019   Procedure: CHEILECTOMY LEFT FOOT;  Surgeon: Evelina Bucy, DPM;  Location: [place];  Service: Podiatry;  Laterality: Left;  Marland Kitchen GASTROC RECESSION EXTREMITY Left 05/17/2019   Procedure: GASTROCNEMIUS RECESS;  Surgeon: Evelina Bucy, DPM;  Location: [place];  Service: Podiatry;  Laterality: Left;  . HAMMER TOE SURGERY Left 05/17/2019   Procedure: HAMMER TOE CORRECTION FIFTH;  Surgeon: Evelina Bucy, DPM;  Location: [place];  Service: Podiatry;  Laterality: Left;  . MINOR CLOSED MANIPULATION Left 05/17/2019   Procedure: MINOR CLOSED MANIPULATION;  Surgeon: Evelina Bucy, DPM;  Location: [place];  Service: Podiatry;  Laterality: Left;  . NO PAST SURGERIES      There were no vitals filed for this visit.   Subjective Assessment - 06/19/20 0757    Subjective Had alot of pain yesterday after walking and mowing the grass.  Had to take Tylenol and thats unusual    Currently in Pain? No/denies                 [facility] Adult PT Treatment/Exercise - 06/19/20 0001      Knee/Hip Exercises: Aerobic   Elliptical 5 min L 2 ramp and resist for warm up       Knee/Hip Exercises: Machines for Strengthening   Cybex Leg Press 2 plates x 20 double then 2 plates single leg x 10 each       Knee/Hip Exercises: Standing   Forward Step Up Left;1 set;20 reps;Hand Hold: 0;Step Height: 8"    Step Down Left;2 sets;10 reps;Hand Hold: 1;Step Height: 6"      Manual Therapy   Manual therapy comments XFF scar     Soft tissue mobilization scar tissue, Achilles, gastrocsoleus  IASTM     Passive ROM ankle DF      Kinesiotix   Create Space horizonal , small piece with 50% stretch mid tendon    Inhibit Muscle  longitudinal along gastroc       Ankle Exercises: Stretches  Slant Board Stretch 3 reps;30 seconds      Ankle Exercises: Standing   Heel Raises Both;20 reps    Heel Raises Limitations off step                     PT Short Term Goals - 01/22/20 1228      PT SHORT TERM GOAL #1   Title independent with initial HEP for L ankle AROM, strength    Status Achieved      PT SHORT TERM GOAL #2   Title Pt will be able to stand, walk in her home with pain min overall, least restrictive asst device as MD allows    Status Achieved      PT SHORT TERM GOAL #3   Title Pt will be able to perform SLS exercises on LLE, min dynamic for 30 sec to work toward full ankle function    Status On-going      PT SHORT TERM GOAL #4   Title Pt will note less swelling in LLE with normal daily activities    Status Partially Met             PT  Long Term Goals - 06/18/20 0641      PT LONG TERM GOAL #1   Title Pt will improve FOTO score to less than 40% impaired    Baseline 67%    Time 6    Period Weeks    Status New    Target Date 07/29/20      PT LONG TERM GOAL #2   Title Pt will be able to squat 25 lbs  with no pain (min tension) in LLE for fitness, functional mobility    Time 6    Period Weeks    Status New    Target Date 07/29/20      PT LONG TERM GOAL #3   Title Pt will be able to walk quickly up and down stairs multiple times without use of hands, increased confidence.    Time 6    Period Weeks    Status New    Target Date 07/29/20      PT LONG TERM GOAL #4   Title independent with advanced HEP for LE    Time 6    Period Weeks    Status New    Target Date 07/29/20      PT LONG TERM GOAL #5   Title Pt will be able to walk/jog intervals for 30 min , 3 x per week without increased pain in ankle/foot.    Baseline inconsistent    Time 6    Period Weeks    Status New    Target Date 07/29/20                 Plan - 06/19/20 1127    Clinical Impression Statement Patient able to perform stretches and mobility for L ankle today with mild irritation in Achilles and lateral ankle. Decreased stability noted. Scar tissue softened with IASTM, taped to create space under tendon.    PT Treatment/Interventions ADLs/Self Care Home Management;Therapeutic activities;Patient/family education;Taping;Therapeutic exercise;Cryotherapy;Ultrasound;Moist Heat;Gait training;Stair training;Functional mobility training;Neuromuscular re-education;Manual techniques;Passive range of motion;Scar mobilization;Vasopneumatic Device;Electrical Stimulation;Dry needling    PT Next Visit Plan SLS, step down , single leg heel raise, scar tissue and tape, hopping, jumping?    PT Home Exercise Plan step ups, downs, SLS and 1/2 kneeling DF near wall    Consulted and Agree with Plan of Care Patient  Patient will benefit from  skilled therapeutic intervention in order to improve the following deficits and impairments:  Abnormal gait, Decreased activity tolerance, Decreased balance, Difficulty walking, Increased edema, Impaired flexibility, Decreased strength, Decreased range of motion, Hypomobility, Postural dysfunction, Decreased scar mobility, Increased fascial restricitons, Pain  Visit Diagnosis: Difficulty in walking, not elsewhere classified  Pain in left ankle and joints of left foot  Muscle weakness (generalized)  Stiffness of left ankle, not elsewhere classified     Problem List There are no problems to display for this patient.   Cynthia Little 06/19/2020, 11:31 AM  [facility] [address] [place], [place], [address] Phone: [phone]   Fax:  [phone]  Name: Cynthia Little MRN: [id number] Date of Birth: October 31, 1991   Raeford Razor, PT 06/19/20 11:32 AM Phone: [phone] Fax: [phone]

## 2020-06-21 ENCOUNTER — Other Ambulatory Visit: Payer: Self-pay | Admitting: Podiatry

## 2020-06-21 ENCOUNTER — Other Ambulatory Visit: Payer: Self-pay

## 2020-06-21 ENCOUNTER — Ambulatory Visit (INDEPENDENT_AMBULATORY_CARE_PROVIDER_SITE_OTHER): Payer: Commercial Managed Care - PPO | Admitting: Podiatry

## 2020-06-21 ENCOUNTER — Ambulatory Visit (INDEPENDENT_AMBULATORY_CARE_PROVIDER_SITE_OTHER): Payer: Commercial Managed Care - PPO

## 2020-06-21 DIAGNOSIS — L910 Hypertrophic scar: Secondary | ICD-10-CM

## 2020-06-21 DIAGNOSIS — M722 Plantar fascial fibromatosis: Secondary | ICD-10-CM | POA: Diagnosis not present

## 2020-06-21 DIAGNOSIS — R52 Pain, unspecified: Secondary | ICD-10-CM | POA: Insufficient documentation

## 2020-06-21 NOTE — Progress Notes (Signed)
  Subjective:  Patient ID: Cynthia Little, female    DOB: 09/07/1991,  MRN: [id number]  Chief Complaint  Patient presents with  . Foot Pain    Pt states left foot medial and lateral pain 2 month duration, no known duration. Tightness with plantar flexion.   29 y.o. female presents with the above complaint. History confirmed with patient.   Objective:  Physical Exam: warm, good capillary refill, no trophic changes or ulcerative lesions, normal DP and PT pulses and normal sensory exam. Left Foot: point tenderness over the heel pad no bony pain posterior heel, firm scar tissue and keloid scar posterior.  No images are attached to the encounter.  Radiographs: X-ray of the left foot: no fracture, dislocation, swelling or degenerative changes noted evidence of prior posterior heel resection Assessment:   1. Plantar fasciitis   2. Hypertrophic scar      Plan:  Patient was evaluated and treated and all questions answered.  Plantar Fasciitis -Injection delivered to the plantar fascia of the left foot.  Procedure: Injection Tendon/Ligament Consent: Verbal consent obtained. Location: Left plantar fascia at the glabrous junction; medial approach. Skin Prep: Alcohol. Injectate: 1 cc 0.5% marcaine plain, 1 cc dexamethasone phosphate, 0.5 cc kenalog 10. Disposition: Patient tolerated procedure well. Injection site dressed with a band-aid.  Keloid Scar -Injection delivered to decrease scar tissue  Procedure: Injection skin lesion Consent: Verbal consent obtained. Location: left heel posteriod Skin Prep: Alcohol. Injectate: 1 cc 0.5% marcaine plain, 0.5 cc kenalog 40. Disposition: Patient tolerated procedure well. Injection site dressed with a band-aid.   Return in about 4 weeks (around 07/19/2020) for Plantar fasciitis, keloid.

## 2020-06-25 ENCOUNTER — Ambulatory Visit: Payer: No Typology Code available for payment source | Attending: Podiatry | Admitting: Physical Therapy

## 2020-06-25 ENCOUNTER — Other Ambulatory Visit: Payer: Self-pay

## 2020-06-25 ENCOUNTER — Encounter: Payer: Self-pay | Admitting: Physical Therapy

## 2020-06-25 DIAGNOSIS — R262 Difficulty in walking, not elsewhere classified: Secondary | ICD-10-CM | POA: Diagnosis not present

## 2020-06-25 DIAGNOSIS — M25572 Pain in left ankle and joints of left foot: Secondary | ICD-10-CM | POA: Diagnosis present

## 2020-06-25 DIAGNOSIS — M6281 Muscle weakness (generalized): Secondary | ICD-10-CM | POA: Diagnosis present

## 2020-06-25 DIAGNOSIS — M25672 Stiffness of left ankle, not elsewhere classified: Secondary | ICD-10-CM | POA: Diagnosis present

## 2020-06-25 NOTE — Therapy (Signed)
[place], [place], [address] Phone: [phone]   Fax:  [phone]  Physical Therapy Treatment  Patient Details  Name: Cynthia Little MRN: [id number] Date of Birth: 1990-12-25 Referring Provider (PT): Sherral Hammers, NP    Encounter Date: 06/25/2020   PT End of Session - 06/25/20 0904    Visit Number 3    Number of Visits 7    Date for PT Re-Evaluation 07/30/20    Authorization Type VA- 6 visits approved    Authorization - Visit Number 3    Authorization - Number of Visits 7    PT Start Time [phone]    PT Stop Time 0852    PT Time Calculation (min) 20 min    Activity Tolerance Patient tolerated treatment well    Behavior During Therapy [facility] for tasks assessed/performed           Past Medical History:  Diagnosis Date  . Anemia   . Asthma   . Bronchitis     Past Surgical History:  Procedure Laterality Date  . CALCANEAL OSTEOTOMY Left 05/17/2019   Procedure: CALCANEAL OSTECTOMY;  Surgeon: Evelina Bucy, DPM;  Location: [place];  Service: Podiatry;  Laterality: Left;  . CESAREAN SECTION  08/11/2012   Procedure: CESAREAN SECTION;  Surgeon: Melina Schools, MD;  Location: [facility];  Service: Gynecology;  Laterality: N/A;  . CHEILECTOMY Left 05/17/2019   Procedure: CHEILECTOMY LEFT FOOT;  Surgeon: Evelina Bucy, DPM;  Location: [place];  Service: Podiatry;  Laterality: Left;  Marland Kitchen GASTROC RECESSION EXTREMITY Left 05/17/2019   Procedure: GASTROCNEMIUS RECESS;  Surgeon: Evelina Bucy, DPM;  Location: [place];  Service: Podiatry;  Laterality: Left;  . HAMMER TOE SURGERY Left 05/17/2019   Procedure: HAMMER TOE CORRECTION FIFTH;  Surgeon: Evelina Bucy, DPM;  Location: [place];  Service: Podiatry;  Laterality: Left;  . MINOR CLOSED MANIPULATION Left 05/17/2019   Procedure: MINOR CLOSED MANIPULATION;  Surgeon: Evelina Bucy, DPM;  Location: [place];  Service: Podiatry;  Laterality: Left;  . NO PAST SURGERIES      There were no vitals filed for this visit.   Subjective Assessment - 06/25/20 0900    Subjective They said they might have to go back in to take out the keloid scar, see him in 4 weeks. Has to leave early today.    Currently in Pain? No/denies            [facility] Adult PT Treatment/Exercise - 06/25/20 0001      Knee/Hip Exercises: Stretches   Gastroc Stretch 5 reps    Gastroc Stretch Limitations 30 sec on slant board, 2 levels       Manual Therapy   Manual therapy comments XFF scar     Soft tissue mobilization scar tissue, Achilles, gastrocsoleus  IASTM     Passive ROM ankle DF               PT Short Term Goals - 01/22/20 1228      PT SHORT TERM GOAL #1   Title independent with initial HEP for L ankle AROM, strength    Status Achieved      PT SHORT TERM GOAL #2   Title Pt will be able to stand, walk in her home with pain min overall, least restrictive asst device as MD allows    Status Achieved      PT  SHORT TERM GOAL #3   Title Pt will be able to perform SLS exercises on LLE, min dynamic for 30 sec to work toward full ankle function    Status On-going      PT SHORT TERM GOAL #4   Title Pt will note less swelling in LLE with normal daily activities    Status Partially Met             PT Long Term Goals - 06/18/20 0641      PT LONG TERM GOAL #1   Title Pt will improve FOTO score to less than 40% impaired    Baseline 67%    Time 6    Period Weeks    Status New    Target Date 07/29/20      PT LONG TERM GOAL #2   Title Pt will be able to squat 25 lbs  with no pain (min tension) in LLE for fitness, functional mobility    Time 6    Period Weeks    Status New    Target Date 07/29/20      PT LONG TERM GOAL #3   Title Pt will be able to walk quickly up and down stairs multiple times without use of hands, increased confidence.    Time 6     Period Weeks    Status New    Target Date 07/29/20      PT LONG TERM GOAL #4   Title independent with advanced HEP for LE    Time 6    Period Weeks    Status New    Target Date 07/29/20      PT LONG TERM GOAL #5   Title Pt will be able to walk/jog intervals for 30 min , 3 x per week without increased pain in ankle/foot.    Baseline inconsistent    Time 6    Period Weeks    Status New    Target Date 07/29/20                 Plan - 06/25/20 0904    Clinical Impression Statement Shortened session due to patient request.  Focused on intense stretching following manual to Lt calf, ankle.  Well tolerated.    PT Treatment/Interventions ADLs/Self Care Home Management;Therapeutic activities;Patient/family education;Taping;Therapeutic exercise;Cryotherapy;Ultrasound;Moist Heat;Gait training;Stair training;Functional mobility training;Neuromuscular re-education;Manual techniques;Passive range of motion;Scar mobilization;Vasopneumatic Device;Electrical Stimulation;Dry needling    PT Next Visit Plan SLS, step down , single leg heel raise, scar tissue and tape, hopping, jumping?    PT Home Exercise Plan step ups, downs, SLS and 1/2 kneeling DF near wall           Patient will benefit from skilled therapeutic intervention in order to improve the following deficits and impairments:  Abnormal gait, Decreased activity tolerance, Decreased balance, Difficulty walking, Increased edema, Impaired flexibility, Decreased strength, Decreased range of motion, Hypomobility, Postural dysfunction, Decreased scar mobility, Increased fascial restricitons, Pain  Visit Diagnosis: Difficulty in walking, not elsewhere classified  Pain in left ankle and joints of left foot  Muscle weakness (generalized)  Stiffness of left ankle, not elsewhere classified     Problem List Patient Active Problem List   Diagnosis Date Noted  . Tenderness 06/21/2020    PAA,JENNIFER 06/25/2020, 9:06 AM  [facility] [address] [place], [place], [address] Phone: [phone]   Fax:  [phone]  Name: Cynthia Little MRN: [id number] Date of Birth: 11/21/1991  Raeford Razor, PT 06/25/20 9:06 AM  Phone: [phone] Fax: [phone]

## 2020-07-03 ENCOUNTER — Ambulatory Visit: Payer: No Typology Code available for payment source | Admitting: Physical Therapy

## 2020-07-05 ENCOUNTER — Ambulatory Visit (HOSPITAL_COMMUNITY)
Admission: EM | Admit: 2020-07-05 | Discharge: 2020-07-05 | Disposition: A | Discharge status: Home / Self Care | Payer: No Typology Code available for payment source | Attending: Urgent Care | Admitting: Urgent Care

## 2020-07-05 ENCOUNTER — Encounter (HOSPITAL_COMMUNITY): Payer: Self-pay | Admitting: Emergency Medicine

## 2020-07-05 ENCOUNTER — Other Ambulatory Visit: Payer: Self-pay

## 2020-07-05 ENCOUNTER — Ambulatory Visit (HOSPITAL_COMMUNITY)
Admission: RE | Admit: 2020-07-05 | Disposition: A | Discharge status: Other Acute Inpatient Hospital | Payer: Commercial Managed Care - PPO | Source: Ambulatory Visit

## 2020-07-05 DIAGNOSIS — R110 Nausea: Secondary | ICD-10-CM

## 2020-07-05 DIAGNOSIS — R197 Diarrhea, unspecified: Secondary | ICD-10-CM

## 2020-07-05 DIAGNOSIS — A09 Infectious gastroenteritis and colitis, unspecified: Secondary | ICD-10-CM

## 2020-07-05 MED ORDER — ONDANSETRON 8 MG PO TBDP: 8.0000 mg | ORAL_TABLET | Freq: Three times a day (TID) | ORAL | 0 refills | Status: DC | PRN

## 2020-07-05 MED ORDER — LOPERAMIDE HCL 2 MG PO CAPS: 2.0000 mg | ORAL_CAPSULE | Freq: Two times a day (BID) | ORAL | 0 refills | Status: DC | PRN

## 2020-07-05 NOTE — ED Provider Notes (Signed)
[place]   MRN: [id number] DOB: 1991/08/16  Subjective:   Cynthia Little is a 29 y.o. female presenting for 3-day history of watery diarrhea with nausea without vomiting. Patient was traveling in [place], came back Tuesday.  Denies fever, abdominal pain, bloody stools.  Has tried Pepto and Imodium.  Imodium has given her some relief.  No current facility-administered medications for this encounter.  Current Outpatient Medications:  .  Apple Cider Vinegar 500 MG TABS, Take by mouth., Disp: , Rfl:  .  B Complex-C-Folic Acid (B COMPLEX-VITAMIN C-FOLIC ACID) 1 MG tablet, Take by mouth., Disp: , Rfl:  .  Collagen 500 MG CAPS, Take by mouth., Disp: , Rfl:  .  fluconazole (DIFLUCAN) 150 MG tablet, Take 1 tablet (150 mg total) by mouth daily. Repeat in 1 week if needed (Patient not taking: Reported on 07/05/2020), Disp: 2 tablet, Rfl: 0 .  ibuprofen (ADVIL) 600 MG tablet, ibuprofen 600 mg tablet, Disp: , Rfl:  .  Multiple Vitamins-Minerals (ONE DAILY MULTIVITAMIN WOMEN PO), Take by mouth., Disp: , Rfl:  .  naproxen (NAPROSYN) 500 MG tablet, naproxen 500 mg tablet, Disp: , Rfl:  .  Specialty Vitamins Products (BIOTIN PLUS KERATIN) 10000-100 MCG-MG TABS, Take by mouth., Disp: , Rfl:    Allergies  Allergen Reactions  . Cephalexin Hives and Other (See Comments)    Hyperventilate     Past Medical History:  Diagnosis Date  . Anemia   . Asthma   . Bronchitis      Past Surgical History:  Procedure Laterality Date  . CALCANEAL OSTEOTOMY Left 05/17/2019   Procedure: CALCANEAL OSTECTOMY;  Surgeon: Evelina Bucy, DPM;  Location: [place];  Service: Podiatry;  Laterality: Left;  . CESAREAN SECTION  08/11/2012   Procedure: CESAREAN SECTION;  Surgeon: Melina Schools, MD;  Location: [facility];  Service: Gynecology;  Laterality: N/A;  . CHEILECTOMY Left 05/17/2019   Procedure: CHEILECTOMY LEFT FOOT;  Surgeon: Evelina Bucy, DPM;  Location: [place];   Service: Podiatry;  Laterality: Left;  Marland Kitchen GASTROC RECESSION EXTREMITY Left 05/17/2019   Procedure: GASTROCNEMIUS RECESS;  Surgeon: Evelina Bucy, DPM;  Location: [place];  Service: Podiatry;  Laterality: Left;  . HAMMER TOE SURGERY Left 05/17/2019   Procedure: HAMMER TOE CORRECTION FIFTH;  Surgeon: Evelina Bucy, DPM;  Location: [place];  Service: Podiatry;  Laterality: Left;  . MINOR CLOSED MANIPULATION Left 05/17/2019   Procedure: MINOR CLOSED MANIPULATION;  Surgeon: Evelina Bucy, DPM;  Location: [place];  Service: Podiatry;  Laterality: Left;  . NO PAST SURGERIES      Family History  Problem Relation Age of Onset  . Diabetes Father     Social History   Tobacco Use  . Smoking status: Never Smoker  . Smokeless tobacco: Never Used  Vaping Use  . Vaping Use: Never used  Substance Use Topics  . Alcohol use: No    Comment: rare  . Drug use: No    ROS   Objective:   Vitals: BP 106/68 (BP Location: Right Arm)   Pulse 87   Temp 98.4 F (36.9 C) (Oral)   Resp 18   SpO2 95%   Physical Exam Constitutional:      General: She is not in acute distress.    Appearance: Normal appearance. She is well-developed and normal weight. She is not ill-appearing, toxic-appearing or diaphoretic.  HENT:     Head: Normocephalic and atraumatic.  Right Ear: External ear normal.     Left Ear: External ear normal.     Nose: Nose normal.     Mouth/Throat:     Mouth: Mucous membranes are moist.     Pharynx: Oropharynx is clear.  Eyes:     General: No scleral icterus.    Extraocular Movements: Extraocular movements intact.     Pupils: Pupils are equal, round, and reactive to light.  Cardiovascular:     Rate and Rhythm: Normal rate and regular rhythm.     Pulses: Normal pulses.     Heart sounds: Normal heart sounds. No murmur heard.  No friction rub. No gallop.   Pulmonary:     Effort: Pulmonary effort is normal. No  respiratory distress.     Breath sounds: Normal breath sounds. No stridor. No wheezing, rhonchi or rales.  Abdominal:     General: Bowel sounds are normal. There is no distension.     Palpations: Abdomen is soft. There is no mass.     Tenderness: There is abdominal tenderness (Generalized and mild over lower abdomen). There is no right CVA tenderness, left CVA tenderness, guarding or rebound.  Skin:    General: Skin is warm and dry.     Coloration: Skin is not pale.     Findings: No rash.  Neurological:     General: No focal deficit present.     Mental Status: She is alert and oriented to person, place, and time.  Psychiatric:        Mood and Affect: Mood normal.        Behavior: Behavior normal.        Thought Content: Thought content normal.        Judgment: Judgment normal.     Assessment and Plan :   PDMP not reviewed this encounter.  1. Traveler's diarrhea   2. Diarrhea, unspecified type   3. Nausea     Will manage for suspected traveler's diarrhea with supportive care.  Recommended patient hydrate well, eat light meals and maintain electrolytes.  Will use Zofran and Imodium for nausea, vomiting and diarrhea. Counseled patient on potential for adverse effects with medications prescribed/recommended today, ER and return-to-clinic precautions discussed, patient verbalized understanding.    Jaynee Eagles, PA-C 07/05/20 1127

## 2020-07-05 NOTE — ED Triage Notes (Signed)
Pt here for diarrhea x 3 days; pt denies vomiting and sts diarrhea is watery

## 2020-07-05 NOTE — Discharge Instructions (Signed)

## 2020-07-18 ENCOUNTER — Ambulatory Visit: Payer: No Typology Code available for payment source | Admitting: Physical Therapy

## 2020-07-19 ENCOUNTER — Ambulatory Visit (INDEPENDENT_AMBULATORY_CARE_PROVIDER_SITE_OTHER): Payer: Commercial Managed Care - PPO | Admitting: Podiatry

## 2020-07-19 ENCOUNTER — Other Ambulatory Visit: Payer: Self-pay

## 2020-07-19 DIAGNOSIS — L910 Hypertrophic scar: Secondary | ICD-10-CM

## 2020-07-19 DIAGNOSIS — L905 Scar conditions and fibrosis of skin: Secondary | ICD-10-CM | POA: Diagnosis not present

## 2020-07-19 DIAGNOSIS — M722 Plantar fascial fibromatosis: Secondary | ICD-10-CM | POA: Diagnosis not present

## 2020-07-19 DIAGNOSIS — L989 Disorder of the skin and subcutaneous tissue, unspecified: Secondary | ICD-10-CM | POA: Diagnosis not present

## 2020-07-19 NOTE — Progress Notes (Signed)
  Subjective:  Patient ID: Cynthia Little, female    DOB: November 29, 1991,  MRN: [id number]  Chief Complaint  Patient presents with  . Foot Pain    Left posterior heel is still sore.   . Foot Pain    Pt states left plantar foot is no longer painful.   29 y.o. female presents with the above complaint. History confirmed with patient.   Objective:  Physical Exam: warm, good capillary refill, no trophic changes or ulcerative lesions, normal DP and PT pulses and normal sensory exam. Left Foot: no point tenderness over the heel pad no bony pain posterior heel, firm scar tissue and keloid scar posterior.  Keloid measuring 8cm  Radiographs: none Assessment:   1. Plantar fasciitis   2. Hypertrophic scar   3. Scar contracture   4. Benign skin lesion      Plan:  Patient was evaluated and treated and all questions answered.  Plantar Fasciitis -Improved. No injectiontoday  Keloid Scar -Still with pain, injection did not improve pain. She has difficulty with shoegear from the thickening of the scar. She has failed multiple injections. The scar is in a highly mobile area and the contracted nature of the scar causes her a lot of pain. -Discussed there are no guarantees that the keloid will not return. -Discussed surgical intervention consisting of excision of the keloid scar with plastic skin closure. Patient amenable and would like to proceed. -Patient has failed all conservative therapy and wishes to proceed with surgical intervention. All risks, benefits, and alternatives discussed with patient. No guarantees given. Consent reviewed and signed by patient. -Planned procedures: Excision of keloid scar, application of cast   No follow-ups on file.

## 2020-07-23 ENCOUNTER — Ambulatory Visit: Payer: No Typology Code available for payment source | Admitting: Physical Therapy

## 2020-07-30 ENCOUNTER — Ambulatory Visit: Payer: No Typology Code available for payment source | Attending: Podiatry | Admitting: Physical Therapy

## 2020-07-30 ENCOUNTER — Other Ambulatory Visit: Payer: Self-pay

## 2020-07-30 DIAGNOSIS — M25561 Pain in right knee: Secondary | ICD-10-CM | POA: Diagnosis present

## 2020-07-30 DIAGNOSIS — M6281 Muscle weakness (generalized): Secondary | ICD-10-CM | POA: Insufficient documentation

## 2020-07-30 DIAGNOSIS — G8929 Other chronic pain: Secondary | ICD-10-CM | POA: Insufficient documentation

## 2020-07-30 DIAGNOSIS — M25572 Pain in left ankle and joints of left foot: Secondary | ICD-10-CM | POA: Insufficient documentation

## 2020-07-30 DIAGNOSIS — M25672 Stiffness of left ankle, not elsewhere classified: Secondary | ICD-10-CM | POA: Insufficient documentation

## 2020-07-30 DIAGNOSIS — R262 Difficulty in walking, not elsewhere classified: Secondary | ICD-10-CM | POA: Diagnosis not present

## 2020-07-30 DIAGNOSIS — M25552 Pain in left hip: Secondary | ICD-10-CM | POA: Diagnosis present

## 2020-07-30 DIAGNOSIS — M25562 Pain in left knee: Secondary | ICD-10-CM | POA: Insufficient documentation

## 2020-07-31 ENCOUNTER — Encounter: Payer: Self-pay | Admitting: Physical Therapy

## 2020-07-31 NOTE — Therapy (Signed)
[place], [place], [address] Phone: [phone]   Fax:  [phone]  Physical Therapy Treatment  Patient Details  Name: Cynthia Little MRN: [id number] Date of Birth: [birth year] Referring Provider (PT): Sherral Hammers, NP    Encounter Date: 07/30/2020   PT End of Session - 07/30/20 1704    Visit Number 4    Number of Visits 7    Date for PT Re-Evaluation 08/20/20    Authorization Type VA- 6 visits approved    Authorization - Visit Number 4    Authorization - Number of Visits 7    PT Start Time 1324    PT Stop Time 1746    PT Time Calculation (min) 44 min    Activity Tolerance Patient tolerated treatment well    Behavior During Therapy [facility] for tasks assessed/performed           Past Medical History:  Diagnosis Date  . Anemia   . Asthma   . Bronchitis     Past Surgical History:  Procedure Laterality Date  . CALCANEAL OSTEOTOMY Left 05/17/2019   Procedure: CALCANEAL OSTECTOMY;  Surgeon: Evelina Bucy, DPM;  Location: [place];  Service: Podiatry;  Laterality: Left;  . CESAREAN SECTION  08/11/2012   Procedure: CESAREAN SECTION;  Surgeon: Melina Schools, MD;  Location: [facility];  Service: Gynecology;  Laterality: N/A;  . CHEILECTOMY Left 05/17/2019   Procedure: CHEILECTOMY LEFT FOOT;  Surgeon: Evelina Bucy, DPM;  Location: [place];  Service: Podiatry;  Laterality: Left;  Marland Kitchen GASTROC RECESSION EXTREMITY Left 05/17/2019   Procedure: GASTROCNEMIUS RECESS;  Surgeon: Evelina Bucy, DPM;  Location: [place];  Service: Podiatry;  Laterality: Left;  . HAMMER TOE SURGERY Left 05/17/2019   Procedure: HAMMER TOE CORRECTION FIFTH;  Surgeon: Evelina Bucy, DPM;  Location: [place];  Service: Podiatry;  Laterality: Left;  . MINOR CLOSED MANIPULATION Left 05/17/2019   Procedure: MINOR CLOSED MANIPULATION;  Surgeon: Evelina Bucy, DPM;  Location: [place];  Service: Podiatry;  Laterality: Left;  . NO PAST SURGERIES      There were no vitals filed for this visit.   Subjective Assessment - 07/30/20 1705    Subjective I am having surgery to remove the keloid. Had a GI bug after my trip. No pain just tight.    Currently in Pain? Yes    Pain Score 1    tight   Pain Location Ankle    Pain Orientation Left    Pain Descriptors / Indicators Tightness    Pain Type Chronic pain    Pain Onset More than a month ago    Pain Frequency Intermittent              [facility] Adult PT Treatment/Exercise - 07/30/20 0001      Knee/Hip Exercises: Standing   Heel Raises Both;1 set;15 reps    Heel Raises Limitations slow eccentric     Forward Step Up Left;2 sets;10 reps;Hand Hold: 2;Hand Hold: 0;Step Height: 8"    Forward Step Up Limitations up and over x 1 set, then just up x 10 without UE assist     Step Down Left;1 set;10 reps      Ankle Exercises: Plyometrics   Bilateral Jumping 2 sets;10 reps   A/P and lateral    Unilateral Jumping 2 sets;10 reps    Box [employer] --    Plyometric Exercises  single leg hop with lateral direction and balance on landing, multiple reps       Ankle Exercises: Stretches   Slant Board Stretch 3 reps;30 seconds      Ankle Exercises: Aerobic   Elliptical 5 min L1 resist, level 10 ramp                   PT Education - 07/31/20 0526    Education Details plyometrics, landing soft    Person(s) Educated Patient    Methods Explanation;Demonstration    Comprehension Returned demonstration;Verbalized understanding            PT Short Term Goals - 07/31/20 0528      PT SHORT TERM GOAL #1   Title independent with initial HEP for L ankle AROM, strength    Status Achieved      PT SHORT TERM GOAL #2   Title Pt will be able to stand, walk in her home with pain min overall, least restrictive asst device as MD allows    Status Achieved      PT SHORT TERM GOAL #3    Title Pt will be able to perform SLS exercises on LLE, min dynamic for 30 sec to work toward full ankle function    Status On-going      PT SHORT TERM GOAL #4   Title Pt will note less swelling in LLE with normal daily activities    Status On-going             PT Long Term Goals - 07/30/20 1713      PT LONG TERM GOAL #1   Title Pt will improve FOTO score to less than 40% impaired    Baseline 43%    Status On-going      PT LONG TERM GOAL #2   Title Pt will be able to squat 25 lbs  with no pain (min tension) in LLE for fitness, functional mobility    Baseline pulling    Status On-going      PT LONG TERM GOAL #3   Title Pt will be able to walk quickly up and down stairs multiple times without use of hands, increased confidence.    Baseline hesitant at times    Status On-going      PT LONG TERM GOAL #4   Title independent with advanced HEP for LE    Status On-going      PT LONG TERM GOAL #5   Title Pt will be able to walk/jog intervals for 30 min , 3 x per week without increased pain in ankle/foot.    Baseline inconsistent    Status On-going                 Plan - 07/30/20 1731    Clinical Impression Statement Needs 2 more visits. Cont to work on reducing tension in L foot, ankle, calf.  Able to increase load in LLE with power and agility exercises. Still quite unbalanced on LLE when hopping, landing on LLE.  Has 2 more visits then will have surgery to remove scar tissue.    PT Frequency 1x / week    PT Duration 2 weeks    PT Treatment/Interventions ADLs/Self Care Home Management;Therapeutic activities;Patient/family education;Taping;Therapeutic exercise;Cryotherapy;Ultrasound;Moist Heat;Gait training;Stair training;Functional mobility training;Neuromuscular re-education;Manual techniques;Passive range of motion;Scar mobilization;Vasopneumatic Device;Electrical Stimulation;Dry needling    PT Next Visit Plan SLS, step down , single leg heel raise, scar tissue and  tape, hopping, jumping?    PT Home Exercise  Plan step ups, downs, SLS and 1/2 kneeling DF near wall    Consulted and Agree with Plan of Care Patient           Patient will benefit from skilled therapeutic intervention in order to improve the following deficits and impairments:  Abnormal gait, Decreased activity tolerance, Decreased balance, Difficulty walking, Increased edema, Impaired flexibility, Decreased strength, Decreased range of motion, Hypomobility, Postural dysfunction, Decreased scar mobility, Increased fascial restricitons, Pain  Visit Diagnosis: Difficulty in walking, not elsewhere classified  Pain in left ankle and joints of left foot  Muscle weakness (generalized)  Stiffness of left ankle, not elsewhere classified     Problem List Patient Active Problem List   Diagnosis Date Noted  . Tenderness 06/21/2020    Kashina Mecum 07/31/2020, 5:33 AM  [facility] [address] [place], [place], [address] Phone: [phone]   Fax:  [phone]  Name: Javayah Magaw MRN: [id number] Date of Birth: April 09, 1991  Raeford Razor, PT 07/31/20 5:34 AM Phone: [phone] Fax: [phone]

## 2020-08-13 ENCOUNTER — Other Ambulatory Visit: Payer: Self-pay

## 2020-08-13 ENCOUNTER — Ambulatory Visit: Payer: No Typology Code available for payment source | Admitting: Physical Therapy

## 2020-08-13 ENCOUNTER — Encounter: Payer: Self-pay | Admitting: Physical Therapy

## 2020-08-13 DIAGNOSIS — G8929 Other chronic pain: Secondary | ICD-10-CM

## 2020-08-13 DIAGNOSIS — M25572 Pain in left ankle and joints of left foot: Secondary | ICD-10-CM

## 2020-08-13 DIAGNOSIS — M25672 Stiffness of left ankle, not elsewhere classified: Secondary | ICD-10-CM

## 2020-08-13 DIAGNOSIS — M6281 Muscle weakness (generalized): Secondary | ICD-10-CM

## 2020-08-13 DIAGNOSIS — M25561 Pain in right knee: Secondary | ICD-10-CM

## 2020-08-13 DIAGNOSIS — R262 Difficulty in walking, not elsewhere classified: Secondary | ICD-10-CM | POA: Diagnosis not present

## 2020-08-13 NOTE — Therapy (Signed)
[place], [place], [address] Phone: [phone]   Fax:  [phone]  Physical Therapy Treatment  Patient Details  Name: Cynthia Little MRN: [id number] Date of Birth: 1991/05/24 Referring Provider (PT): Sherral Hammers, NP    Encounter Date: 08/13/2020   PT End of Session - 08/13/20 1543    Visit Number 5    Number of Visits 7    Date for PT Re-Evaluation 08/20/20    Authorization Type VA- 6 visits approved    Authorization - Visit Number 5    Authorization - Number of Visits 7    PT Start Time 5852    PT Stop Time 1618    PT Time Calculation (min) 38 min    Activity Tolerance Patient tolerated treatment well    Behavior During Therapy [facility] for tasks assessed/performed           Past Medical History:  Diagnosis Date  . Anemia   . Asthma   . Bronchitis     Past Surgical History:  Procedure Laterality Date  . CALCANEAL OSTEOTOMY Left 05/17/2019   Procedure: CALCANEAL OSTECTOMY;  Surgeon: Evelina Bucy, DPM;  Location: [place];  Service: Podiatry;  Laterality: Left;  . CESAREAN SECTION  08/11/2012   Procedure: CESAREAN SECTION;  Surgeon: Melina Schools, MD;  Location: [facility];  Service: Gynecology;  Laterality: N/A;  . CHEILECTOMY Left 05/17/2019   Procedure: CHEILECTOMY LEFT FOOT;  Surgeon: Evelina Bucy, DPM;  Location: [place];  Service: Podiatry;  Laterality: Left;  Marland Kitchen GASTROC RECESSION EXTREMITY Left 05/17/2019   Procedure: GASTROCNEMIUS RECESS;  Surgeon: Evelina Bucy, DPM;  Location: [place];  Service: Podiatry;  Laterality: Left;  . HAMMER TOE SURGERY Left 05/17/2019   Procedure: HAMMER TOE CORRECTION FIFTH;  Surgeon: Evelina Bucy, DPM;  Location: [place];  Service: Podiatry;  Laterality: Left;  . MINOR CLOSED MANIPULATION Left 05/17/2019   Procedure: MINOR CLOSED MANIPULATION;  Surgeon: Evelina Bucy, DPM;  Location: [place];  Service: Podiatry;  Laterality: Left;  . NO PAST SURGERIES      There were no vitals filed for this visit.   Subjective Assessment - 08/13/20 1546    Subjective She has been having spasms in foot (top) when she tries to flex it or stretch it. No pain just a bit tight.    Currently in Pain? No/denies              [facility] Adult PT Treatment/Exercise - 08/13/20 0001      Knee/Hip Exercises: Standing   Step Down Left;3 sets;10 reps;Hand Hold: 1;Hand Hold: 0    Functional Squat 3 sets;10 reps    Functional Squat Limitations banded       Cryotherapy   Number Minutes Cryotherapy 8 Minutes    Cryotherapy Location Ankle    Type of Cryotherapy Ice pack      Manual Therapy   Passive ROM ankle DF and IASTM to scar, achilles       Ankle Exercises: Stretches   Slant Board Stretch 20 seconds   x 3     Ankle Exercises: Aerobic   Elliptical 6 min L1 resist, level 8 ramp       Ankle Exercises: Standing   Balance Master: Static single leg heel raise 2 x 8     Heel Raises 20 reps    Heel Raises Limitations tennis ball  Other Standing Ankle Exercises side lunge , back lunge x 10 each leg.  Wentbarefoot for more stability.     Other Standing Ankle Exercises added a SLS with lunging on return                     PT Short Term Goals - 07/31/20 0528      PT SHORT TERM GOAL #1   Title independent with initial HEP for L ankle AROM, strength    Status Achieved      PT SHORT TERM GOAL #2   Title Pt will be able to stand, walk in her home with pain min overall, least restrictive asst device as MD allows    Status Achieved      PT SHORT TERM GOAL #3   Title Pt will be able to perform SLS exercises on LLE, min dynamic for 30 sec to work toward full ankle function    Status On-going      PT SHORT TERM GOAL #4   Title Pt will note less swelling in LLE with normal daily activities    Status On-going             PT Long  Term Goals - 07/30/20 1713      PT LONG TERM GOAL #1   Title Pt will improve FOTO score to less than 40% impaired    Baseline 43%    Status On-going      PT LONG TERM GOAL #2   Title Pt will be able to squat 25 lbs  with no pain (min tension) in LLE for fitness, functional mobility    Baseline pulling    Status On-going      PT LONG TERM GOAL #3   Title Pt will be able to walk quickly up and down stairs multiple times without use of hands, increased confidence.    Baseline hesitant at times    Status On-going      PT LONG TERM GOAL #4   Title independent with advanced HEP for LE    Status On-going      PT LONG TERM GOAL #5   Title Pt will be able to walk/jog intervals for 30 min , 3 x per week without increased pain in ankle/foot.    Baseline inconsistent    Status On-going                 Plan - 08/13/20 1721    Clinical Impression Statement Patient lacking full strength in [place].  Focused on less jumping and more controlled exercises.  Barefoot improved stability.    PT Treatment/Interventions ADLs/Self Care Home Management;Therapeutic activities;Patient/family education;Taping;Therapeutic exercise;Cryotherapy;Ultrasound;Moist Heat;Gait training;Stair training;Functional mobility training;Neuromuscular re-education;Manual techniques;Passive range of motion;Scar mobilization;Vasopneumatic Device;Electrical Stimulation;Dry needling    PT Next Visit Plan SLS, step down , single leg heel raise, scar tissue and tape, hopping, jumping? add to HEP? lunge with SLS    PT Home Exercise Plan step ups, downs, SLS and 1/2 kneeling DF near wall    Consulted and Agree with Plan of Care Patient           Patient will benefit from skilled therapeutic intervention in order to improve the following deficits and impairments:  Abnormal gait, Decreased activity tolerance, Decreased balance, Difficulty walking, Increased edema, Impaired flexibility, Decreased strength, Decreased  range of motion, Hypomobility, Postural dysfunction, Decreased scar mobility, Increased fascial restricitons, Pain  Visit Diagnosis: Difficulty in walking, not elsewhere classified  Pain in left ankle and joints  of left foot  Muscle weakness (generalized)  Stiffness of left ankle, not elsewhere classified  Chronic pain of right knee     Problem List Patient Active Problem List   Diagnosis Date Noted  . Tenderness 06/21/2020    Kiearra Oyervides 08/13/2020, 5:49 PM  [facility] [address] [place], [place], [address] Phone: [phone]   Fax:  [phone]  Name: Cynthia Little MRN: [id number] Date of Birth: 26-Nov-1991  Raeford Razor, PT 08/13/20 5:50 PM Phone: [phone] Fax: [phone]

## 2020-08-20 ENCOUNTER — Ambulatory Visit: Payer: No Typology Code available for payment source | Admitting: Physical Therapy

## 2020-08-20 ENCOUNTER — Other Ambulatory Visit: Payer: Self-pay

## 2020-08-20 DIAGNOSIS — G8929 Other chronic pain: Secondary | ICD-10-CM

## 2020-08-20 DIAGNOSIS — M25552 Pain in left hip: Secondary | ICD-10-CM

## 2020-08-20 DIAGNOSIS — R262 Difficulty in walking, not elsewhere classified: Secondary | ICD-10-CM

## 2020-08-20 DIAGNOSIS — M25672 Stiffness of left ankle, not elsewhere classified: Secondary | ICD-10-CM

## 2020-08-20 DIAGNOSIS — M25572 Pain in left ankle and joints of left foot: Secondary | ICD-10-CM

## 2020-08-20 DIAGNOSIS — M6281 Muscle weakness (generalized): Secondary | ICD-10-CM

## 2020-08-20 NOTE — Therapy (Signed)
[place], [place], [address] Phone: [phone]   Fax:  [phone]  Physical Therapy Treatment  Patient Details  Name: Eugina Row MRN: [id number] Date of Birth: [birth year] Referring Provider (PT): Sherral Hammers, NP    Encounter Date: 08/20/2020   PT End of Session - 08/20/20 1707    Visit Number 6    Date for PT Re-Evaluation 08/20/20    Authorization Type VA- 6 visits approved    Authorization - Visit Number 6    PT Start Time 1618    PT Stop Time 1705    PT Time Calculation (min) 47 min    Activity Tolerance Patient tolerated treatment well    Behavior During Therapy [facility] for tasks assessed/performed           Past Medical History:  Diagnosis Date  . Anemia   . Asthma   . Bronchitis     Past Surgical History:  Procedure Laterality Date  . CALCANEAL OSTEOTOMY Left 05/17/2019   Procedure: CALCANEAL OSTECTOMY;  Surgeon: Evelina Bucy, DPM;  Location: [place];  Service: Podiatry;  Laterality: Left;  . CESAREAN SECTION  08/11/2012   Procedure: CESAREAN SECTION;  Surgeon: Melina Schools, MD;  Location: [facility];  Service: Gynecology;  Laterality: N/A;  . CHEILECTOMY Left 05/17/2019   Procedure: CHEILECTOMY LEFT FOOT;  Surgeon: Evelina Bucy, DPM;  Location: [place];  Service: Podiatry;  Laterality: Left;  Marland Kitchen GASTROC RECESSION EXTREMITY Left 05/17/2019   Procedure: GASTROCNEMIUS RECESS;  Surgeon: Evelina Bucy, DPM;  Location: [place];  Service: Podiatry;  Laterality: Left;  . HAMMER TOE SURGERY Left 05/17/2019   Procedure: HAMMER TOE CORRECTION FIFTH;  Surgeon: Evelina Bucy, DPM;  Location: [place];  Service: Podiatry;  Laterality: Left;  . MINOR CLOSED MANIPULATION Left 05/17/2019   Procedure: MINOR CLOSED MANIPULATION;  Surgeon: Evelina Bucy, DPM;  Location: [place];  Service:  Podiatry;  Laterality: Left;  . NO PAST SURGERIES      There were no vitals filed for this visit.   Subjective Assessment - 08/20/20 1637    Subjective DOing OK.  It was really tight the other night and i dont know why.  Just a pulling.    Currently in Pain? No/denies              [facility] PT Assessment - 08/20/20 0001      AROM   Left Ankle Dorsiflexion 16    Left Ankle Plantar Flexion 37    Left Ankle Inversion 42    Left Ankle Eversion 20          Ankle strength 5/5 throughout LLE    [facility] Adult PT Treatment/Exercise - 08/20/20 0001      Knee/Hip Exercises: Stretches   Other Knee/Hip Stretches 3 way hip stretch LLE with strap       Knee/Hip Exercises: Standing   Heel Raises Both;1 set;15 reps    Heel Raises Limitations slow eccentric     Functional Squat 1 set;15 reps    Functional Squat Limitations 25 lbs KB     SLS static 30 + sec     SLS with Vectors UE exercises green band : row, lunge + punch and SLS, trunk rotation on LLE, diagonal pull x 10-15 each       Manual Therapy   Passive ROM  IASTM to scar, achilles  Ankle Exercises: Aerobic   Elliptical 8 min L1 resist, level 8 ramp                     PT Short Term Goals - 08/20/20 1649      PT SHORT TERM GOAL #1   Title independent with initial HEP for L ankle AROM, strength    Status Achieved      PT SHORT TERM GOAL #2   Title Pt will be able to stand, walk in her home with pain min overall, least restrictive asst device as MD allows    Status Achieved      PT SHORT TERM GOAL #3   Title Pt will be able to perform SLS exercises on LLE, min dynamic for 30 sec to work toward full ankle function    Status Achieved      PT SHORT TERM GOAL #4   Title Pt will note less swelling in LLE with normal daily activities    Status Achieved             PT Long Term Goals - 08/20/20 1651      PT LONG TERM GOAL #1   Title Pt will improve FOTO score to less than 40% impaired    Baseline 39%     Status Achieved      PT LONG TERM GOAL #2   Title Pt will be able to squat 25 lbs  with no pain (min tension) in LLE for fitness, functional mobility    Status Achieved      PT LONG TERM GOAL #3   Title Pt will be able to walk quickly up and down stairs multiple times without use of hands, increased confidence.    Status Partially Met      PT LONG TERM GOAL #4   Title independent with advanced HEP for LE    Status Achieved      PT LONG TERM GOAL #5   Title Pt will be able to walk/jog intervals for 30 min , 3 x per week without increased pain in ankle/foot.    Baseline has not done jogging but does cardio and weights at home    Status Achieved                 Plan - 08/20/20 1657    Clinical Impression Statement Patient is discharged from PT as she has met her goals.  AROM has improved a great deal.  She is left with significant scar tissue which she may have surgically removed in the coming weeks.    PT Treatment/Interventions ADLs/Self Care Home Management;Therapeutic activities;Patient/family education;Taping;Therapeutic exercise;Cryotherapy;Ultrasound;Moist Heat;Gait training;Stair training;Functional mobility training;Neuromuscular re-education;Manual techniques;Passive range of motion;Scar mobilization;Vasopneumatic Device;Electrical Stimulation;Dry needling    PT Next Visit Plan SLS, step down , single leg heel raise, scar tissue and tape, hopping, jumping? add to HEP? lunge with SLS    PT Home Exercise Plan step ups, downs, SLS and 1/2 kneeling DF near wall    Consulted and Agree with Plan of Care Patient           Patient will benefit from skilled therapeutic intervention in order to improve the following deficits and impairments:  Abnormal gait, Decreased activity tolerance, Decreased balance, Difficulty walking, Increased edema, Impaired flexibility, Decreased strength, Decreased range of motion, Hypomobility, Postural dysfunction, Decreased scar mobility, Increased  fascial restricitons, Pain  Visit Diagnosis: Difficulty in walking, not elsewhere classified  Pain in left ankle and joints of left foot  Muscle weakness (generalized)  Stiffness of left ankle, not elsewhere classified  Chronic pain of right knee  Chronic pain of left knee  Pain in left hip     Problem List Patient Active Problem List   Diagnosis Date Noted  . Tenderness 06/21/2020    Mong Neal 08/20/2020, 5:08 PM  [facility] [address] [place], [place], [address] Phone: [phone]   Fax:  [phone]  Name: Mabeline Varas MRN: [id number] Date of Birth: 1991/09/26   PHYSICAL THERAPY DISCHARGE SUMMARY  Visits from Start of Care: 6  Current functional level related to goals / functional outcomes: See above    Remaining deficits: None limiting,  Occasional tightness.    Education / Equipment: HEP, balance, stability , scar tissue massage  Plan: Patient agrees to discharge.  Patient goals were met. Patient is being discharged due to meeting the stated rehab goals.  ?????    Raeford Razor, PT 08/20/20 5:09 PM Phone: [phone] Fax: [phone]

## 2020-09-04 ENCOUNTER — Encounter: Payer: Self-pay | Admitting: Podiatry

## 2020-09-10 ENCOUNTER — Encounter: Payer: No Typology Code available for payment source | Admitting: Podiatry

## 2020-09-17 ENCOUNTER — Encounter: Payer: Commercial Managed Care - PPO | Admitting: Podiatry

## 2020-10-01 ENCOUNTER — Encounter: Payer: Commercial Managed Care - PPO | Admitting: Podiatry

## 2020-10-30 ENCOUNTER — Other Ambulatory Visit: Payer: Self-pay

## 2020-10-30 DIAGNOSIS — L905 Scar conditions and fibrosis of skin: Secondary | ICD-10-CM

## 2020-10-30 DIAGNOSIS — L910 Hypertrophic scar: Secondary | ICD-10-CM

## 2020-10-30 DIAGNOSIS — L989 Disorder of the skin and subcutaneous tissue, unspecified: Secondary | ICD-10-CM

## 2020-11-11 ENCOUNTER — Other Ambulatory Visit: Payer: Self-pay | Admitting: Podiatry

## 2020-11-11 DIAGNOSIS — L910 Hypertrophic scar: Secondary | ICD-10-CM | POA: Diagnosis not present

## 2020-11-11 MED ORDER — FLUCONAZOLE 150 MG PO TABS
150.0000 mg | ORAL_TABLET | Freq: Every day | ORAL | 0 refills | Status: DC
Start: 2020-11-11 — End: 2020-11-26

## 2020-11-11 MED ORDER — CLINDAMYCIN HCL 150 MG PO CAPS: 150.0000 mg | ORAL_CAPSULE | Freq: Two times a day (BID) | ORAL | 0 refills | Status: DC

## 2020-11-11 MED ORDER — OXYCODONE-ACETAMINOPHEN 5-325 MG PO TABS
1.0000 | ORAL_TABLET | ORAL | 0 refills | Status: DC | PRN
Start: 2020-11-11 — End: 2020-11-26

## 2020-11-11 MED ORDER — ONDANSETRON HCL 4 MG PO TABS: 4.0000 mg | ORAL_TABLET | Freq: Three times a day (TID) | ORAL | 0 refills | Status: DC | PRN

## 2020-11-11 NOTE — Progress Notes (Signed)
Rx sent to pharmacy for outpatient surgery. °

## 2020-11-12 ENCOUNTER — Encounter: Payer: Commercial Managed Care - PPO | Admitting: Podiatry

## 2020-11-19 ENCOUNTER — Encounter: Payer: Self-pay | Admitting: Podiatry

## 2020-11-19 ENCOUNTER — Ambulatory Visit (INDEPENDENT_AMBULATORY_CARE_PROVIDER_SITE_OTHER): Payer: Commercial Managed Care - PPO | Admitting: Podiatry

## 2020-11-19 ENCOUNTER — Encounter: Payer: Commercial Managed Care - PPO | Admitting: Podiatry

## 2020-11-19 ENCOUNTER — Other Ambulatory Visit: Payer: Self-pay

## 2020-11-19 DIAGNOSIS — M7662 Achilles tendinitis, left leg: Secondary | ICD-10-CM

## 2020-11-19 DIAGNOSIS — Z9889 Other specified postprocedural states: Secondary | ICD-10-CM

## 2020-11-19 DIAGNOSIS — L910 Hypertrophic scar: Secondary | ICD-10-CM

## 2020-11-19 NOTE — Progress Notes (Signed)
  Subjective:  Patient ID: Cynthia Little, female    DOB: 07/18/1991,  MRN: [id number]  No chief complaint on file.   DOS: 11/11/20 Procedure: Excision of scar left foot, application of cast  29 y.o. female presents with the above complaint. History confirmed with patient.  Objective:  Physical Exam: no tenderness at the surgical site and no edema noted. Incision: healing well, no significant drainage, no significant erythema  Assessment:   1. Hypertrophic scar   2. Post-operative state   3. Tendonitis, Achilles, left     Plan:  Patient was evaluated and treated and all questions answered.  Post-operative State -Incision reinforced with steri strips. -Dressed with DSD. -Continue knee scooter. -Continue ASA 81 mg -No need for pain refill today. -new boot dispensed for immobilization.  Return in about 1 week (around 11/26/2020) for Post-Op (No XRs).

## 2020-11-26 ENCOUNTER — Other Ambulatory Visit: Payer: Self-pay

## 2020-11-26 ENCOUNTER — Ambulatory Visit (INDEPENDENT_AMBULATORY_CARE_PROVIDER_SITE_OTHER): Payer: Self-pay | Admitting: Podiatry

## 2020-11-26 DIAGNOSIS — Z9889 Other specified postprocedural states: Secondary | ICD-10-CM

## 2020-11-26 DIAGNOSIS — L910 Hypertrophic scar: Secondary | ICD-10-CM

## 2020-11-26 NOTE — Progress Notes (Signed)
  Subjective:  Patient ID: Cynthia Little, female    DOB: 05-Feb-1991,  MRN: [id number]  Chief Complaint  Patient presents with  . Routine Post Op    POV#2 Pt states no concerns or pain, denies fever/chills/nausea/vomiting.    DOS: 11/11/20 Procedure: Excision of scar left foot, application of cast  29 y.o. female presents with the above complaint. History confirmed with patient. Doing very well denies pain or discomfort.  Objective:  Physical Exam: no tenderness at the surgical site and no edema noted. Incision: healing well, no significant drainage, no significant erythema  Assessment:   1. Hypertrophic scar   2. Post-operative state     Plan:  Patient was evaluated and treated and all questions answered.  Post-operative State -Sutures removed today. -Abx ointment and mepilex border applied. -NWB x 1 week, then WBAT for 1 week, then normal shoes. -Start Mederma when healed.  Return in about 3 weeks (around 12/17/2020) for Post-Op (No XRs).

## 2020-12-03 ENCOUNTER — Encounter: Payer: Commercial Managed Care - PPO | Admitting: Podiatry

## 2020-12-17 ENCOUNTER — Ambulatory Visit (INDEPENDENT_AMBULATORY_CARE_PROVIDER_SITE_OTHER): Payer: Commercial Managed Care - PPO | Admitting: Podiatry

## 2020-12-17 ENCOUNTER — Other Ambulatory Visit: Payer: Self-pay

## 2020-12-17 DIAGNOSIS — L910 Hypertrophic scar: Secondary | ICD-10-CM

## 2020-12-17 DIAGNOSIS — Z9889 Other specified postprocedural states: Secondary | ICD-10-CM

## 2020-12-17 DIAGNOSIS — E559 Vitamin D deficiency, unspecified: Secondary | ICD-10-CM | POA: Insufficient documentation

## 2020-12-17 NOTE — Patient Instructions (Signed)
Scar Regimen -Arnica Gel in the AM -Mederma Cream at night  Daily for at least 3 weeks  Ok to soak in [facility] and massage the area several times a week

## 2020-12-17 NOTE — Progress Notes (Signed)
  Subjective:  Patient ID: Cynthia Little, female    DOB: 1991/03/03,  MRN: [id number]  Chief Complaint  Patient presents with  . Routine Post Op    Pov#3 DOS 11.22.2021 Pt states no concerns, some residual tightness, gradual improvement.   DOS: 11/11/20 Procedure: Excision of scar left foot, application of cast  29 y.o. female presents with the above complaint. History confirmed with patient.  Wearing normal shoe gear without pain does feel some tightness in the ankle area.  Objective:  Physical Exam: no tenderness at the surgical site and no edema noted. Incision: Well-healed with slight keloid formation Assessment:   1. Hypertrophic scar   2. Post-operative state     Plan:  Patient was evaluated and treated and all questions answered.  Post-operative State -Additional retained suture removed.  The wound is significantly improved since prior to surgery with slight keloid formation.  At this point I think she is good for aggressive scar reduction regimen.  Patient to use Arnica gel in the morning and Mederma cream at night.  Continue weightbearing as tolerated normal shoe gear will follow up in 4 weeks for recheck  Return in about 4 weeks (around 01/14/2021) for Post-Op (No XRs).

## 2021-01-14 ENCOUNTER — Other Ambulatory Visit: Payer: Self-pay

## 2021-01-14 ENCOUNTER — Ambulatory Visit (INDEPENDENT_AMBULATORY_CARE_PROVIDER_SITE_OTHER): Payer: Commercial Managed Care - PPO | Admitting: Podiatry

## 2021-01-14 DIAGNOSIS — Z9889 Other specified postprocedural states: Secondary | ICD-10-CM

## 2021-01-14 DIAGNOSIS — L910 Hypertrophic scar: Secondary | ICD-10-CM

## 2021-01-14 NOTE — Progress Notes (Signed)
  Subjective:  Patient ID: Cynthia Little, female    DOB: 12/21/1991,  MRN: [id number]  Chief Complaint  Patient presents with  . Routine Post Op    POV#4 -pt states," it's been doing good, but still tender at the back of my heel ; 7/10 soreness." -pt denies redness/swelling/N/V/F/Ch Tx: arnicare and mederma    DOS: 11/11/20 Procedure: Excision of scar left foot, application of cast  30 y.o. female presents with the above complaint. History confirmed with patient. Denies limitations of activities due to the pain.  Objective:  Physical Exam: no tenderness at the surgical site and no edema noted. Incision: Well-healed with slight keloid formation Assessment:   1. Hypertrophic scar   2. Post-operative state     Plan:  Patient was evaluated and treated and all questions answered.  Post-operative State -Scar improving, with some pain but improved c/t prior to surgery. Continue regimen of mederma and arnica, halt arnica if the scar is excessively dry. F/u in 6 weeks for final check.  No follow-ups on file.

## 2021-02-25 ENCOUNTER — Other Ambulatory Visit: Payer: Self-pay

## 2021-02-25 ENCOUNTER — Ambulatory Visit (INDEPENDENT_AMBULATORY_CARE_PROVIDER_SITE_OTHER): Payer: No Typology Code available for payment source | Admitting: Podiatry

## 2021-02-25 DIAGNOSIS — L910 Hypertrophic scar: Secondary | ICD-10-CM

## 2021-02-25 DIAGNOSIS — M7662 Achilles tendinitis, left leg: Secondary | ICD-10-CM | POA: Diagnosis not present

## 2021-02-25 NOTE — Progress Notes (Signed)
  Subjective:  Patient ID: Cynthia Little, female    DOB: August 28, 1991,  MRN: [id number]  Chief Complaint  Patient presents with  . Routine Post Op    POV 11/11/2020 Exc. Benign Lesion Over 4.0 cm Lt; Cast Application Lt -left foot -no redness- puffiness - pt states she has been smoothing her scar.   DOS: 11/11/20 Procedure: Excision of scar left foot, application of cast  30 y.o. female presents with the above complaint. History confirmed with patient. Denies pain or discomfort. Doing scar regimen as directed. Denies new issues.  Objective:  Physical Exam: no tenderness at the surgical site and no edema noted. Incision: Well-healed with slight keloid formation Assessment:   1. Hypertrophic scar   2. Tendonitis, Achilles, left     Plan:  Patient was evaluated and treated and all questions answered.  Post-operative State -Continues to do well. Scar is improved compared to prior. She does have mild keloid formation. Continue arnica and mederma. At this point will f/u should issues persist.  No follow-ups on file.

## 2021-07-23 LAB — HM PAP SMEAR

## 2021-09-04 LAB — LAB REPORT - SCANNED
A1c: 5.5
EGFR: 98

## 2022-05-11 ENCOUNTER — Encounter (HOSPITAL_COMMUNITY): Payer: Self-pay

## 2022-05-11 ENCOUNTER — Ambulatory Visit (HOSPITAL_COMMUNITY)
Admission: RE | Admit: 2022-05-11 | Discharge: 2022-05-11 | Disposition: A | Discharge status: Home / Self Care | Payer: Commercial Managed Care - PPO | Source: Ambulatory Visit | Attending: Internal Medicine | Admitting: Internal Medicine

## 2022-05-11 VITALS — BP 135/79 | HR 112 | Temp 100.5°F | Resp 16 | Ht 64.0 in | Wt 251.3 lb

## 2022-05-11 DIAGNOSIS — A084 Viral intestinal infection, unspecified: Secondary | ICD-10-CM

## 2022-05-11 LAB — CBC WITH DIFFERENTIAL/PLATELET
Abs Immature Granulocytes: 0.05 10*3/uL (ref 0.00–0.07)
Basophils Absolute: 0 10*3/uL (ref 0.0–0.1)
Basophils Relative: 0 %
Eosinophils Absolute: 0 10*3/uL (ref 0.0–0.5)
Eosinophils Relative: 0 %
HCT: 40.1 % (ref 36.0–46.0)
Hemoglobin: 13.5 g/dL (ref 12.0–15.0)
Immature Granulocytes: 0 %
Lymphocytes Relative: 5 %
Lymphs Abs: 0.5 10*3/uL — ABNORMAL LOW (ref 0.7–4.0)
MCH: 27 pg (ref 26.0–34.0)
MCHC: 33.7 g/dL (ref 30.0–36.0)
MCV: 80.2 fL (ref 80.0–100.0)
Monocytes Absolute: 0.3 10*3/uL (ref 0.1–1.0)
Monocytes Relative: 2 %
Neutro Abs: 11.1 10*3/uL — ABNORMAL HIGH (ref 1.7–7.7)
Neutrophils Relative %: 93 %
Platelets: 248 10*3/uL (ref 150–400)
RBC: 5 MIL/uL (ref 3.87–5.11)
RDW: 15 % (ref 11.5–15.5)
WBC: 11.9 10*3/uL — ABNORMAL HIGH (ref 4.0–10.5)
nRBC: 0 % (ref 0.0–0.2)

## 2022-05-11 LAB — BASIC METABOLIC PANEL
Anion gap: 9 (ref 5–15)
BUN: 7 mg/dL (ref 6–20)
CO2: 22 mmol/L (ref 22–32)
Calcium: 8.9 mg/dL (ref 8.9–10.3)
Chloride: 105 mmol/L (ref 98–111)
Creatinine, Ser: 0.94 mg/dL (ref 0.44–1.00)
GFR, Estimated: >60 mL/min (ref >60)
Glucose, Bld: 114 mg/dL — ABNORMAL HIGH (ref 70–99)
Potassium: 3.7 mmol/L (ref 3.5–5.1)
Sodium: 136 mmol/L (ref 135–145)

## 2022-05-11 MED ORDER — ONDANSETRON 4 MG PO TBDP
ORAL_TABLET | ORAL | Status: AC
  Filled 2022-05-11: qty 1

## 2022-05-11 MED ORDER — ONDANSETRON 4 MG PO TBDP: 4.0000 mg | ORAL_TABLET | Freq: Three times a day (TID) | ORAL | 0 refills | Status: DC | PRN

## 2022-05-11 MED ORDER — ONDANSETRON 4 MG PO TBDP
4.0000 mg | ORAL_TABLET | Freq: Once | ORAL | Status: AC
  Administered 2022-05-11: 4 mg via ORAL

## 2022-05-11 NOTE — Discharge Instructions (Addendum)
Increase oral fluid intake Take medications as prescribed We will call you with recommendations if labs are abnormal If symptoms worsen please return to urgent care to be reevaluated.

## 2022-05-11 NOTE — ED Triage Notes (Addendum)
Pt reports lower abdominal pain, lower back, headache, fever, n/v/d and chills since 1:53 this morning. States unable to keep anything down, Tried ginger ale, Pedialyte, water and saltine crackers.

## 2022-05-11 NOTE — ED Provider Notes (Signed)
[facility]    CSN: [id number] Arrival date & time: 05/11/22  1530      History   Chief Complaint Chief Complaint  Patient presents with   Chills    Entered by patient   Fever   Diarrhea   Emesis    HPI Cynthia Little is a 31 y.o. female comes to the urgent care with a 1 day history of lower abdominal pain, headache, fever, nausea and vomiting.  Patient says nausea and vomiting started at 2 AM this morning.  Patient had a few episodes of nonbloody nonbilious vomiting followed by several bouts of nonbloody nonmucoid diarrhea.  Patient denies any sick contacts.  No change in dietary habits.  No recent travel.  Lower abdominal pain is constant, of mild severity and without bloating.  No dizziness, near syncope or syncopal episode.  Patient denies any dysuria urgency or frequency.  She is fully vaccinated against COVID-19 virus.  No sore throat or pain with swallowing.  HPI  Past Medical History:  Diagnosis Date   Anemia    Asthma    Bronchitis     Patient Active Problem List   Diagnosis Date Noted   Morbid obesity ([place]) 12/17/2020   Vitamin D deficiency 12/17/2020   Tenderness 06/21/2020    Past Surgical History:  Procedure Laterality Date   CALCANEAL OSTEOTOMY Left 05/17/2019   Procedure: CALCANEAL OSTECTOMY;  Surgeon: Evelina Bucy, DPM;  Location: [place];  Service: Podiatry;  Laterality: Left;   CESAREAN SECTION  08/11/2012   Procedure: CESAREAN SECTION;  Surgeon: Melina Schools, MD;  Location: [facility];  Service: Gynecology;  Laterality: N/A;   CHEILECTOMY Left 05/17/2019   Procedure: CHEILECTOMY LEFT FOOT;  Surgeon: Evelina Bucy, DPM;  Location: [place];  Service: Podiatry;  Laterality: Left;   GASTROC RECESSION EXTREMITY Left 05/17/2019   Procedure: GASTROCNEMIUS RECESS;  Surgeon: Evelina Bucy, DPM;  Location: [facility] [facility];  Service: Podiatry;  Laterality: Left;   HAMMER TOE SURGERY Left 05/17/2019    Procedure: HAMMER TOE CORRECTION FIFTH;  Surgeon: Evelina Bucy, DPM;  Location: [place];  Service: Podiatry;  Laterality: Left;   MINOR CLOSED MANIPULATION Left 05/17/2019   Procedure: MINOR CLOSED MANIPULATION;  Surgeon: Evelina Bucy, DPM;  Location: [place];  Service: Podiatry;  Laterality: Left;   NO PAST SURGERIES      OB History     Gravida  2   Para  1   Term  1   Preterm  0   AB  1   Living  1      SAB  1   IAB  0   Ectopic  0   Multiple  0   Live Births  1            Home Medications    Prior to Admission medications   Medication Sig Start Date End Date Taking? Authorizing Provider  ondansetron (ZOFRAN-ODT) 4 MG disintegrating tablet Take 1 tablet (4 mg total) by mouth every 8 (eight) hours as needed for nausea or vomiting. 05/11/22  Yes Zamantha Strebel, Myrene Galas, MD  B Complex-C-Folic Acid (B COMPLEX-VITAMIN C-FOLIC ACID) 1 MG tablet Take by mouth.    [provider]  Collagen 500 MG CAPS Take by mouth.    Joline Salt, RN  fluconazole (DIFLUCAN) 150 MG tablet  10/24/20   [provider]  hydroquinone 4 % cream Apply nightly to affected  areas of face with hyperpigmentation. (You may use after your tretinoin cream, or mix the 2 together) 08/28/20   [provider]  ibuprofen (ADVIL) 600 MG tablet ibuprofen 600 mg tablet    [provider]  Multiple Vitamin (MULTIVITAMIN) capsule Take 1 capsule by mouth daily.    [provider]  Multiple Vitamins-Minerals (ONE DAILY MULTIVITAMIN WOMEN PO) Take by mouth.    Joline Salt, RN  Specialty Vitamins Products (BIOTIN PLUS KERATIN) 10000-100 MCG-MG TABS Take by mouth.    Joline Salt, RN  tretinoin (RETIN-A) 0.025 % cream Apply a pea-sized amount nightly to the face and spread. 08/28/20   [provider]  enoxaparin (LOVENOX) 40 MG/0.4ML injection Inject 0.4 mLs (40 mg total) into the skin every 12 (twelve) hours. 05/17/19  01/23/20  Evelina Bucy, DPM    Family History Family History  Problem Relation Age of Onset   Diabetes Father     Social History Social History   Tobacco Use   Smoking status: Never   Smokeless tobacco: Never  Vaping Use   Vaping Use: Never used  Substance Use Topics   Alcohol use: No    Comment: rare   Drug use: No     Allergies   Cephalexin   Review of Systems Review of Systems  HENT: Negative.    Cardiovascular: Negative.   Gastrointestinal:  Positive for abdominal pain, diarrhea, nausea and vomiting.  Genitourinary: Negative.   Skin: Negative.   Neurological: Negative.     Physical Exam Triage Vital Signs ED Triage Vitals  Enc Vitals Group     BP 05/11/22 1555 135/79     Pulse Rate 05/11/22 1555 (!) 112     Resp 05/11/22 1555 16     Temp 05/11/22 1555 (!) 100.5 F (38.1 C)     Temp Source 05/11/22 1555 Oral     SpO2 05/11/22 1555 96 %     Weight 05/11/22 1554 251 lb 5.2 oz (114 kg)     Height 05/11/22 1554 '5\' 4"'$  (1.626 m)     Head Circumference --      Peak Flow --      Pain Score 05/11/22 1553 6     Pain Loc --      Pain Edu? --      Excl. in [place]? --    No data found.  Updated Vital Signs BP 135/79 (BP Location: Right Arm)   Pulse (!) 112   Temp (!) 100.5 F (38.1 C) (Oral)   Resp 16   Ht '5\' 4"'$  (1.626 m)   Wt 114 kg   SpO2 96%   BMI 43.14 kg/m   Visual Acuity Right Eye Distance:   Left Eye Distance:   Bilateral Distance:    Right Eye Near:   Left Eye Near:    Bilateral Near:     Physical Exam Vitals and nursing note reviewed.  Constitutional:      General: She is in acute distress.     Appearance: She is not ill-appearing.  Cardiovascular:     Rate and Rhythm: Normal rate and regular rhythm.     Pulses: Normal pulses.     Heart sounds: Normal heart sounds.  Pulmonary:     Effort: Pulmonary effort is normal.     Breath sounds: Normal breath sounds.  Abdominal:     General: Bowel sounds are normal.     Comments: Lower  abdominal pain.  No distention.  Neurological:  Mental Status: She is alert.     UC Treatments / Results  Labs (all labs ordered are listed, but only abnormal results are displayed) Labs Reviewed  CBC WITH DIFFERENTIAL/PLATELET  BASIC METABOLIC PANEL    EKG   Radiology No results found.  Procedures Procedures (including critical care time)  Medications Ordered in UC Medications - No data to display  Initial Impression / Assessment and Plan / UC Course  I have reviewed the triage vital signs and the nursing notes.  Pertinent labs & imaging results that were available during my care of the patient were reviewed by me and considered in my medical decision making (see chart for details).     1.  Viral gastroenteritis: Increase oral fluid intake Zofran as needed for nausea/vomiting CBC, BMP We will call patient with recommendations if labs are abnormal Return precautions given. Final Clinical Impressions(s) / UC Diagnoses   Final diagnoses:  Viral gastroenteritis     Discharge Instructions      Increase oral fluid intake Take medications as prescribed We will call you with recommendations if labs are abnormal If symptoms worsen please return to urgent care to be reevaluated.   ED Prescriptions     Medication Sig Dispense Auth. Provider   ondansetron (ZOFRAN-ODT) 4 MG disintegrating tablet Take 1 tablet (4 mg total) by mouth every 8 (eight) hours as needed for nausea or vomiting. 20 tablet Lorella Gomez, Myrene Galas, MD      PDMP not reviewed this encounter.   Chase Picket, MD 05/11/22 [phone]

## 2022-07-29 ENCOUNTER — Ambulatory Visit (INDEPENDENT_AMBULATORY_CARE_PROVIDER_SITE_OTHER): Payer: Commercial Managed Care - PPO | Admitting: Family Medicine

## 2022-07-29 VITALS — BP 110/78 | HR 73 | Temp 98.5°F | Wt 258.0 lb

## 2022-07-29 DIAGNOSIS — L819 Disorder of pigmentation, unspecified: Secondary | ICD-10-CM | POA: Diagnosis not present

## 2022-07-29 DIAGNOSIS — J4520 Mild intermittent asthma, uncomplicated: Secondary | ICD-10-CM | POA: Diagnosis not present

## 2022-07-29 DIAGNOSIS — N939 Abnormal uterine and vaginal bleeding, unspecified: Secondary | ICD-10-CM

## 2022-07-29 DIAGNOSIS — Z889 Allergy status to unspecified drugs, medicaments and biological substances status: Secondary | ICD-10-CM | POA: Diagnosis not present

## 2022-07-29 DIAGNOSIS — Z7689 Persons encountering health services in other specified circumstances: Secondary | ICD-10-CM

## 2022-07-29 DIAGNOSIS — Z6841 Body Mass Index (BMI) 40.0 and over, adult: Secondary | ICD-10-CM

## 2022-07-29 LAB — HM PAP SMEAR: HM Pap smear: NORMAL

## 2022-07-29 NOTE — Progress Notes (Signed)
Patient presents to clinic today to f/u and establish care.  SUBJECTIVE: PMH: Pt is a 31 yo female with pmh sig for asthma and obesity.  Previously seen at [facility].  Also followed by Dr. Garwin Brothers, OB/GYN.  AUB: -Notes regular menses until December 2022.  Irregular menses return January 2023.  Became abnormal again 3/24-4/24 and on and off June till present with continuous bleeding. -had Ultrasound a week or so ago.  A small fibroid noted however not thought to contribute to current symptoms. -Followed by Dr. Garwin Brothers -PAP 07/28/2022 -Taking evening primrose  Sinus issues/asthma: -Change in season causes increased facial pressure, drainage, headaches. -Patient wears mask while cutting grass -Antihistamine or nasal spray as needed -No recent asthma flares  Skin change: -pt noticed a light area on R buttock within the last few weeks -Denies using any new creams, lotions, steroid creams -Several year ago saw Dr. Leonie Green, dermatology  Weight: -Patient endorses weight gain -Drinking 1 gallon of water per day  Allergies: Keflex-hives, hyperventilation  Past surgical history: C-section-2013 Left foot surgery for Achilles' tendon reconstruction/stretching May 10, 2019 -Scar tissue removal of left foot/Achilles 07/09/2020  Social hx: Pt is single.  She has a son.  Pt works at the [employer].  Patient endorses social alcohol use.  Patient denies tobacco and drug use.  Health Maintenance: Dental --[facility] --Dr. Madilyn Hook, 06/2021 Immunizations -- Colonoscopy -- Mammogram -- PAP --07/28/2022.  Followed by Dr. Garwin Brothers. Bone Density --   Family medical history: Mom-alive, ovarian cancer Dad-Alive Son-asthma MGM-Alive, DM, HTN   Past Medical History:  Diagnosis Date   Anemia    Asthma    Bronchitis     Past Surgical History:  Procedure Laterality Date   CALCANEAL OSTEOTOMY Left 05/17/2019   Procedure: CALCANEAL OSTECTOMY;  Surgeon:  Evelina Bucy, DPM;  Location: [facility];  Service: Podiatry;  Laterality: Left;   CESAREAN SECTION  08/11/2012   Procedure: CESAREAN SECTION;  Surgeon: Melina Schools, MD;  Location: [facility];  Service: Gynecology;  Laterality: N/A;   CHEILECTOMY Left 05/17/2019   Procedure: CHEILECTOMY LEFT FOOT;  Surgeon: Evelina Bucy, DPM;  Location: [place];  Service: Podiatry;  Laterality: Left;   GASTROC RECESSION EXTREMITY Left 05/17/2019   Procedure: GASTROCNEMIUS RECESS;  Surgeon: Evelina Bucy, DPM;  Location: [place];  Service: Podiatry;  Laterality: Left;   HAMMER TOE SURGERY Left 05/17/2019   Procedure: HAMMER TOE CORRECTION FIFTH;  Surgeon: Evelina Bucy, DPM;  Location: [place];  Service: Podiatry;  Laterality: Left;   MINOR CLOSED MANIPULATION Left 05/17/2019   Procedure: MINOR CLOSED MANIPULATION;  Surgeon: Evelina Bucy, DPM;  Location: [place];  Service: Podiatry;  Laterality: Left;   NO PAST SURGERIES      Current Outpatient Medications on File Prior to Visit  Medication Sig Dispense Refill   B Complex-C-Folic Acid (B COMPLEX-VITAMIN C-FOLIC ACID) 1 MG tablet Take by mouth.     CRANBERRY PO Take by mouth.     [facility] EVENING PRIMROSE OIL PO Take by mouth.     ibuprofen (ADVIL) 600 MG tablet ibuprofen 600 mg tablet     Specialty Vitamins Products (BIOTIN PLUS KERATIN) 10000-100 MCG-MG TABS Take by mouth.     Collagen 500 MG CAPS Take by mouth. (Patient not taking: Reported on 07/29/2022)     fluconazole (DIFLUCAN) 150 MG tablet  (Patient not taking: Reported on 07/29/2022)  hydroquinone 4 % cream Apply nightly to affected areas of face with hyperpigmentation. (You may use after your tretinoin cream, or mix the 2 together) (Patient not taking: Reported on 07/29/2022)     Multiple Vitamin (MULTIVITAMIN) capsule Take 1 capsule by mouth daily. (Patient not taking: Reported on 07/29/2022)     Multiple  Vitamins-Minerals (ONE DAILY MULTIVITAMIN WOMEN PO) Take by mouth.     ondansetron (ZOFRAN-ODT) 4 MG disintegrating tablet Take 1 tablet (4 mg total) by mouth every 8 (eight) hours as needed for nausea or vomiting. (Patient not taking: Reported on 07/29/2022) 20 tablet 0   tretinoin (RETIN-A) 0.025 % cream Apply a pea-sized amount nightly to the face and spread. (Patient not taking: Reported on 07/29/2022)     [DISCONTINUED] enoxaparin (LOVENOX) 40 MG/0.4ML injection Inject 0.4 mLs (40 mg total) into the skin every 12 (twelve) hours. 28 Syringe 0   No current facility-administered medications on file prior to visit.    Allergies  Allergen Reactions   Cephalexin Hives and Other (See Comments)    Hyperventilate     Family History  Problem Relation Age of Onset   Diabetes Father     Social History   Socioeconomic History   Marital status: Single    Spouse name: Not on file   Number of children: Not on file   Years of education: Not on file   Highest education level: Not on file  Occupational History   Not on file  Tobacco Use   Smoking status: Never   Smokeless tobacco: Never  Vaping Use   Vaping Use: Never used  Substance and Sexual Activity   Alcohol use: No    Comment: rare   Drug use: No   Sexual activity: Yes  Other Topics Concern   Not on file  Social History Narrative   Not on file   Social Determinants of Health   Financial Resource Strain: Not on file  Food Insecurity: Not on file  Transportation Needs: Not on file  Physical Activity: Not on file  Stress: Not on file  Social Connections: Not on file  Intimate Partner Violence: Not on file    ROS General: Denies fever, chills, night sweats, changes in appetite  + changes in weight HEENT: Denies headaches, ear pain, changes in vision, rhinorrhea, sore throat CV: Denies CP, palpitations, SOB, orthopnea Pulm: Denies SOB, cough, wheezing GI: Denies abdominal pain, nausea, vomiting, diarrhea, constipation GU:  Denies dysuria, hematuria, frequency, vaginal discharge + AUB Msk: Denies muscle cramps, joint pains Neuro: Denies weakness, numbness, tingling Skin: Denies rashes, bruising  + light spot on right hip/buttock Psych: Denies depression, anxiety, hallucinations   BP 110/78 (BP Location: Right Arm, Patient Position: Sitting, Cuff Size: Large)   Pulse 73   Temp 98.5 F (36.9 C) (Oral)   Wt 258 lb (117 kg)   SpO2 95%   BMI 44.29 kg/m   Physical Exam Gen. Pleasant, well developed, well-nourished, in NAD HEENT - [place]/AT, PERRL, EOMI, conjunctive clear, no scleral icterus, no nasal drainage, pharynx without erythema or exudate.  TMs normal bilaterally. Lungs: no use of accessory muscles, CTAB, no wheezes, rales or rhonchi Cardiovascular: RRR,  No r/g/m, no peripheral edema Abdomen: BS present, soft, nontender,nondistended Musculoskeletal: No deformities, moves all four extremities, no cyanosis or clubbing, normal tone Neuro:  A&Ox3, CN II-XII intact, normal gait Skin:  Warm, dry, intact.  Hyperpigmented macule on right lateral buttock  Recent Results (from the past 2160 hour(s))  CBC with Differential  Status: Abnormal   Collection Time: 05/11/22  4:24 PM  Result Value Ref Range   WBC 11.9 (H) 4.0 - 10.5 K/uL   RBC 5.00 3.87 - 5.11 MIL/uL   Hemoglobin 13.5 12.0 - 15.0 g/dL   HCT 40.1 36.0 - 46.0 %   MCV 80.2 80.0 - 100.0 fL   MCH 27.0 26.0 - 34.0 pg   MCHC 33.7 30.0 - 36.0 g/dL   RDW 15.0 11.5 - 15.5 %   Platelets 248 150 - 400 K/uL   nRBC 0.0 0.0 - 0.2 %   Neutrophils Relative % 93 %   Neutro Abs 11.1 (H) 1.7 - 7.7 K/uL   Lymphocytes Relative 5 %   Lymphs Abs 0.5 (L) 0.7 - 4.0 K/uL   Monocytes Relative 2 %   Monocytes Absolute 0.3 0.1 - 1.0 K/uL   Eosinophils Relative 0 %   Eosinophils Absolute 0.0 0.0 - 0.5 K/uL   Basophils Relative 0 %   Basophils Absolute 0.0 0.0 - 0.1 K/uL   Immature Granulocytes 0 %   Abs Immature Granulocytes 0.05 0.00 - 0.07 K/uL    Comment:  Performed at [facility], [address], [place], [place] [address]  Basic metabolic panel     Status: Abnormal   Collection Time: 05/11/22  4:24 PM  Result Value Ref Range   Sodium 136 135 - 145 mmol/L   Potassium 3.7 3.5 - 5.1 mmol/L   Chloride 105 98 - 111 mmol/L   CO2 22 22 - 32 mmol/L   Glucose, Bld 114 (H) 70 - 99 mg/dL    Comment: Glucose reference range applies only to samples taken after fasting for at least 8 hours.   BUN 7 6 - 20 mg/dL   Creatinine, Ser 0.94 0.44 - 1.00 mg/dL   Calcium 8.9 8.9 - 10.3 mg/dL   GFR, Estimated >60 >60 mL/min    Comment: (NOTE) Calculated using the CKD-EPI Creatinine Equation (2021)    Anion gap 9 5 - 15    Comment: Performed at [facility] [address], [place], [place] [address]    Assessment/Plan: Mild intermittent asthma without complication -Stable -Not currently requiring meds -Continue to monitor  Abnormal uterine bleeding (AUB) -Continue current medications -Continue follow-up with Dr. Garwin Brothers, OB/GYN  Hx of seasonal allergies -OTC antihistamine as needed  Skin hypopigmentation -Discussed possible causes -Can place referral to dermatology  Class 3 severe obesity due to excess calories with body mass index (BMI) of 40.0 to 44.9 in adult, unspecified whether serious comorbidity present ([place]) -Body mass index is 44.29 kg/m. -Lifestyle modifications encouraged -Consider weight management -Obtain labs during CPE  Encounter to establish care -We reviewed the PMH, PSH, FH, SH, Meds and Allergies. -We provided refills for any medications we will prescribe as needed. -We addressed current concerns per orders and patient instructions. -We have asked for records for pertinent exams, studies, vaccines and notes from previous providers. -We have advised patient to follow up per instructions below.  F/u prn  Grier Mitts, MD

## 2022-08-11 ENCOUNTER — Encounter: Payer: Self-pay | Admitting: Family Medicine

## 2022-09-09 ENCOUNTER — Ambulatory Visit (INDEPENDENT_AMBULATORY_CARE_PROVIDER_SITE_OTHER): Payer: Commercial Managed Care - PPO | Admitting: Family Medicine

## 2022-09-09 VITALS — BP 120/80 | HR 82 | Temp 98.4°F | Wt 263.0 lb

## 2022-09-09 DIAGNOSIS — Z23 Encounter for immunization: Secondary | ICD-10-CM | POA: Diagnosis not present

## 2022-09-09 DIAGNOSIS — H01004 Unspecified blepharitis left upper eyelid: Secondary | ICD-10-CM

## 2022-09-09 DIAGNOSIS — H1012 Acute atopic conjunctivitis, left eye: Secondary | ICD-10-CM | POA: Diagnosis not present

## 2022-09-09 DIAGNOSIS — Z6841 Body Mass Index (BMI) 40.0 and over, adult: Secondary | ICD-10-CM

## 2022-09-09 NOTE — Progress Notes (Signed)
Subjective:    Patient ID: Cynthia Little, female    DOB: 06-21-1991, 31 y.o.   MRN: [id number]  Chief Complaint  Patient presents with   Eye Problem    Left eye swollen; noted this morning after mowing grass  &walking into a spider web on yesterday. States her eye feels sore.    HPI Patient is a 30 yo female w/ h/o asthma, seasonal allergies, and anemia who was seen today for acute concern.  Patient endorses walking into spiderweb while cutting the grass yesterday.  Pt was able to get the web of her face.  Endorses rubbing eyes.  Woke up this am with medial  L upper eyelid edema and pruritis.  Pt denies drainage, erythema of eye or pain.  Pt inquires about wt management clinic.  Trying to lose weight.  Does not want surgery.  Working on lifestyle modifications.  Pt mentions fatigue especially at the end of the day.  Denies depression.  Enjoys her job.  Pt notes being told she snores at night.  Had a sleep study 04/09/2020 that was normal.  Endorses wt gain since the sleep study.  Typically sleeps on her side.  Could take naps if sitting comfortably at home, but does not fall asleep at work.  Past Medical History:  Diagnosis Date   Anemia    Asthma    Bronchitis     Allergies  Allergen Reactions   Cephalexin Hives and Other (See Comments)    Hyperventilate     ROS  General: Denies fever, chills, night sweats, changes in weight, changes in appetite +fatigue HEENT: Denies headaches, ear pain, changes in vision, rhinorrhea, sore throat + L upper eyelid edema and pruritis CV: Denies CP, palpitations, SOB, orthopnea Pulm: Denies SOB, cough, wheezing GI: Denies abdominal pain, nausea, vomiting, diarrhea, constipation GU: Denies dysuria, hematuria, frequency, vaginal discharge Msk: Denies muscle cramps, joint pains Neuro: Denies weakness, numbness, tingling Skin: Denies rashes, bruising Psych: Denies depression, anxiety, hallucinations    Objective:    Blood pressure 120/80,  pulse 82, temperature 98.4 F (36.9 C), temperature source Oral, weight 263 lb (119.3 kg), last menstrual period 08/19/2022, SpO2 97 %, unknown if currently breastfeeding. Body mass index is 45.14 kg/m.  Gen. Pleasant, well-nourished, in no distress, normal affect   HEENT: /AT, face symmetric, mild edema of L upper eyelid with small papule forming medially,  conjunctiva clear, no scleral icterus, PERRLA, EOMI, nares patent without drainage Lungs: no accessory muscle use Cardiovascular: RRR, no peripheral edema Neuro:  A&Ox3, CN II-XII intact, normal gait Skin:  Warm, no lesions/ rash.  Mild edema of L upper eyelid   Wt Readings from Last 3 Encounters:  09/09/22 263 lb (119.3 kg)  07/29/22 258 lb (117 kg)  05/11/22 251 lb 5.2 oz (114 kg)    Lab Results  Component Value Date   WBC 11.9 (H) 05/11/2022   HGB 13.5 05/11/2022   HCT 40.1 05/11/2022   PLT 248 05/11/2022   GLUCOSE 114 (H) 05/11/2022   NA 136 05/11/2022   K 3.7 05/11/2022   CL 105 05/11/2022   CREATININE 0.94 05/11/2022   BUN 7 05/11/2022   CO2 22 05/11/2022    Assessment/Plan:  Blepharitis of left upper eyelid, unspecified type -likely 2/2 irritation from spider web and likely from allergens in air while cutting the grass. -discussed supportive care including compresses, allergy versus lubricating eyedrops and p.o. antihistamine -Given samples of Zyrtec -monitor for development of a pustule on medial eyelid. -avoid rubbing  eye.  Cleanse with gently no tears baby shampoo. -given precautions  Flu vaccine need  - Plan: Flu Vaccine QUAD 6+ mos PF IM (Fluarix Quad PF)  Class 3 severe obesity due to excess calories without serious comorbidity with body mass index (BMI) of 45.0 to 49.9 in adult [facility])  -continue lifestyle modifications - Plan: Amb Ref to Medical Weight Management  Allergic conjunctivitis of left eye -OTC po antihistamine or allergy eye drop  F/u prn.  Pt to schedule CPE in the next few wks with  labs to further evaluate fatigue.  Grier Mitts, MD

## 2022-09-28 ENCOUNTER — Ambulatory Visit: Payer: Commercial Managed Care - PPO | Admitting: Family Medicine

## 2022-10-01 ENCOUNTER — Encounter: Payer: Self-pay | Admitting: Family Medicine

## 2022-10-01 ENCOUNTER — Ambulatory Visit (INDEPENDENT_AMBULATORY_CARE_PROVIDER_SITE_OTHER): Payer: Commercial Managed Care - PPO | Admitting: Family Medicine

## 2022-10-01 VITALS — BP 100/72 | HR 78 | Temp 98.6°F | Ht 64.0 in | Wt 259.0 lb

## 2022-10-01 DIAGNOSIS — J302 Other seasonal allergic rhinitis: Secondary | ICD-10-CM

## 2022-10-01 DIAGNOSIS — Z0000 Encounter for general adult medical examination without abnormal findings: Secondary | ICD-10-CM | POA: Diagnosis not present

## 2022-10-01 DIAGNOSIS — Z1159 Encounter for screening for other viral diseases: Secondary | ICD-10-CM

## 2022-10-01 LAB — COMPREHENSIVE METABOLIC PANEL
ALT: 18 U/L (ref 0–35)
AST: 16 U/L (ref 0–37)
Albumin: 4.2 g/dL (ref 3.5–5.2)
Alkaline Phosphatase: 48 U/L (ref 39–117)
BUN: 7 mg/dL (ref 6–23)
CO2: 24 mEq/L (ref 19–32)
Calcium: 9.2 mg/dL (ref 8.4–10.5)
Chloride: 106 mEq/L (ref 96–112)
Creatinine, Ser: 0.82 mg/dL (ref 0.40–1.20)
GFR: 93.79 mL/min (ref >60.00)
Glucose, Bld: 96 mg/dL (ref 70–99)
Potassium: 4 mEq/L (ref 3.5–5.1)
Sodium: 137 mEq/L (ref 135–145)
Total Bilirubin: 0.3 mg/dL (ref 0.2–1.2)
Total Protein: 7.4 g/dL (ref 6.0–8.3)

## 2022-10-01 LAB — CBC WITH DIFFERENTIAL/PLATELET
Basophils Absolute: 0 10*3/uL (ref 0.0–0.1)
Basophils Relative: 0.5 % (ref 0.0–3.0)
Eosinophils Absolute: 0.2 10*3/uL (ref 0.0–0.7)
Eosinophils Relative: 2.2 % (ref 0.0–5.0)
HCT: 36.1 % (ref 36.0–46.0)
Hemoglobin: 12 g/dL (ref 12.0–15.0)
Lymphocytes Relative: 29 % (ref 12.0–46.0)
Lymphs Abs: 2.2 10*3/uL (ref 0.7–4.0)
MCHC: 33.1 g/dL (ref 30.0–36.0)
MCV: 79.8 fl (ref 78.0–100.0)
Monocytes Absolute: 0.4 10*3/uL (ref 0.1–1.0)
Monocytes Relative: 5.6 % (ref 3.0–12.0)
Neutro Abs: 4.7 10*3/uL (ref 1.4–7.7)
Neutrophils Relative %: 62.7 % (ref 43.0–77.0)
Platelets: 249 10*3/uL (ref 150.0–400.0)
RBC: 4.53 Mil/uL (ref 3.87–5.11)
RDW: 14.8 % (ref 11.5–15.5)
WBC: 7.5 10*3/uL (ref 4.0–10.5)

## 2022-10-01 LAB — LIPID PANEL
Cholesterol: 130 mg/dL (ref 0–200)
HDL: 31.4 mg/dL — ABNORMAL LOW (ref >39.00)
LDL Cholesterol: 80 mg/dL (ref 0–99)
NonHDL: 98.85
Total CHOL/HDL Ratio: 4
Triglycerides: 96 mg/dL (ref 0.0–149.0)
VLDL: 19.2 mg/dL (ref 0.0–40.0)

## 2022-10-01 LAB — T4, FREE: Free T4: 0.71 ng/dL (ref 0.60–1.60)

## 2022-10-01 LAB — TSH: TSH: 2.62 u[IU]/mL (ref 0.35–5.50)

## 2022-10-01 LAB — VITAMIN D 25 HYDROXY (VIT D DEFICIENCY, FRACTURES): VITD: 22.39 ng/mL — ABNORMAL LOW (ref 30.00–100.00)

## 2022-10-01 LAB — HEMOGLOBIN A1C: Hgb A1c MFr Bld: 5.6 % (ref 4.6–6.5)

## 2022-10-01 NOTE — Progress Notes (Signed)
Subjective:     Cynthia Little is a 31 y.o. female and is here for a comprehensive physical exam. The patient reports doing well.  Pt exercising and tracking calories to lose wt.  States weight is less on scale at home.  Has yet to hear about weight management overall.  Patient states she developed a nosebleed earlier this week after taking a 1 mile walk with her son.  Patient states this typically occurs when she is about to have sinus issues.  Not currently taking any medication for allergies.  Social History   Socioeconomic History   Marital status: Single    Spouse name: Not on file   Number of children: Not on file   Years of education: Not on file   Highest education level: Some college, no degree  Occupational History   Not on file  Tobacco Use   Smoking status: Never   Smokeless tobacco: Never  Vaping Use   Vaping Use: Never used  Substance and Sexual Activity   Alcohol use: No    Comment: rare   Drug use: No   Sexual activity: Yes  Other Topics Concern   Not on file  Social History Narrative   Not on file   Social Determinants of Health   Financial Resource Strain: Low Risk  (09/09/2022)   Overall Financial Resource Strain (CARDIA)    Difficulty of Paying Living Expenses: Not hard at all  Food Insecurity: No Food Insecurity (09/09/2022)   Hunger Vital Sign    Worried About Running Out of Food in the Last Year: Never true    [place] in the Last Year: Never true  Transportation Needs: No Transportation Needs (09/09/2022)   PRAPARE - Hydrologist (Medical): No    Lack of Transportation (Non-Medical): No  Physical Activity: Unknown (09/09/2022)   Exercise Vital Sign    Days of Exercise per Week: 0 days    Minutes of Exercise per Session: Not on file  Stress: Stress Concern Present (09/09/2022)   [place]    Feeling of Stress : To some extent  Social  Connections: Socially Isolated (09/09/2022)   Social Connection and Isolation Panel [NHANES]    Frequency of Communication with Friends and Family: More than three times a week    Frequency of Social Gatherings with Friends and Family: Twice a week    Attends Religious Services: Never    Marine scientist or Organizations: No    Attends Music therapist: Not on file    Marital Status: Never married  Intimate Partner Violence: Not on file   Health Maintenance  Topic Date Due   Hepatitis C Screening  Never done   PAP SMEAR-Modifier  07/28/2025   TETANUS/TDAP  08/11/2031   INFLUENZA VACCINE  Completed   COVID-19 Vaccine  Completed   HIV Screening  Completed   HPV VACCINES  Aged Out    The following portions of the patient's history were reviewed and updated as appropriate: allergies, current medications, past family history, past medical history, past social history, past surgical history, and problem list.  Review of Systems Pertinent items noted in HPI and remainder of comprehensive ROS otherwise negative.   Objective:    BP 100/72 (BP Location: Left Arm, Patient Position: Sitting, Cuff Size: Large)   Pulse 78   Temp 98.6 F (37 C) (Oral)   Ht '5\' 4"'$  (1.626 m)  Wt 259 lb (117.5 kg)   LMP 08/19/2022 (Exact Date)   SpO2 90%   BMI 44.46 kg/m  General appearance: alert, cooperative, and no distress Head: Normocephalic, without obvious abnormality, atraumatic Eyes: conjunctivae/corneas clear. PERRL, EOM's intact. Fundi benign. Ears: normal TM's and external ear canals both ears Nose: Nares normal. Septum midline. Mucosa normal. No drainage or sinus tenderness. Throat: lips, mucosa, and tongue normal; teeth and gums normal Neck: no adenopathy, no carotid bruit, no JVD, supple, symmetrical, trachea midline, and thyroid not enlarged, symmetric, no tenderness/mass/nodules Lungs: clear to auscultation bilaterally Heart: regular rate and rhythm, S1, S2 normal, no  murmur, click, rub or gallop Abdomen: soft, non-tender; bowel sounds normal; no masses,  no organomegaly Extremities: extremities normal, atraumatic, no cyanosis or edema Pulses: 2+ and symmetric Skin: Skin color, texture, turgor normal.  Acanthosis nigricans on posterior neck.  No rashes or lesions Lymph nodes: Cervical, supraclavicular, and axillary nodes normal. Neurologic: Alert and oriented X 3, normal strength and tone. Normal symmetric reflexes. Normal coordination and gait    Assessment:    Healthy female exam.      Plan:    Anticipatory guidance given including wearing seatbelts, smoke detectors in the home, increasing physical activity, increasing p.o. intake of water and vegetables. -labs -Immunizations reviewed. -Pap done 07/28/2022 -Given handout -Next CPE in 1 year See After Visit Summary for Counseling Recommendations  Well adult exam - Plan: CBC with Differential/Platelet  Morbid obesity ([place])  -Continue lifestyle modifications -Patient encouraged on recent weight loss -Referral for weight management placed at prior [facility] and improved by insurance however patient has yet to be contacted.  Patient encouraged to continue to expect a phone call about scheduling this appointment. - Plan: CBC with Differential/Platelet, TSH, T4, Free, Hemoglobin A1c, Lipid panel, CMP, Vitamin D, 25-hydroxy  Encounter for hepatitis C screening test for low risk patient  - Plan: Hep C Antibody  Seasonal allergies -Discussed using saline nasal rinse or OTC and doing such as Zyrtec, Claritin, Xyzal -For dry nasal passages consider OTC [place]  Follow-up as needed  Grier Mitts, MD

## 2022-10-01 NOTE — Patient Instructions (Signed)
The referral that was placed for weight management was authorized by your insurance company however there are no additional regarding scheduling.  It is likely that they have not gotten a chance to contact you yet.  Let us know if you still have not heard anything in the next few weeks.

## 2022-10-02 ENCOUNTER — Other Ambulatory Visit: Payer: Self-pay | Admitting: Family Medicine

## 2022-10-02 DIAGNOSIS — E559 Vitamin D deficiency, unspecified: Secondary | ICD-10-CM

## 2022-10-02 LAB — HEPATITIS C ANTIBODY: Hepatitis C Ab: NONREACTIVE

## 2022-10-02 MED ORDER — VITAMIN D (ERGOCALCIFEROL) 1.25 MG (50000 UNIT) PO CAPS: 50000.0000 [IU] | ORAL_CAPSULE | ORAL | 0 refills | Status: AC

## 2022-10-02 NOTE — Telephone Encounter (Signed)
Attachments printed off and placed on providers desk.

## 2022-10-20 ENCOUNTER — Encounter (INDEPENDENT_AMBULATORY_CARE_PROVIDER_SITE_OTHER): Payer: Self-pay

## 2022-11-07 ENCOUNTER — Ambulatory Visit (HOSPITAL_COMMUNITY)
Admission: EM | Admit: 2022-11-07 | Discharge: 2022-11-07 | Disposition: A | Discharge status: Home / Self Care | Payer: No Typology Code available for payment source | Attending: Physician Assistant | Admitting: Physician Assistant

## 2022-11-07 ENCOUNTER — Encounter (HOSPITAL_COMMUNITY): Payer: Self-pay

## 2022-11-07 DIAGNOSIS — J4521 Mild intermittent asthma with (acute) exacerbation: Secondary | ICD-10-CM

## 2022-11-07 DIAGNOSIS — J069 Acute upper respiratory infection, unspecified: Secondary | ICD-10-CM | POA: Diagnosis not present

## 2022-11-07 MED ORDER — ALBUTEROL SULFATE HFA 108 (90 BASE) MCG/ACT IN AERS: 2.0000 | INHALATION_SPRAY | RESPIRATORY_TRACT | 1 refills | Status: AC | PRN

## 2022-11-07 MED ORDER — PROMETHAZINE-DM 6.25-15 MG/5ML PO SYRP
5.0000 mL | ORAL_SOLUTION | Freq: Two times a day (BID) | ORAL | 0 refills | Status: DC | PRN
Start: 2022-11-07 — End: 2022-12-07

## 2022-11-07 MED ORDER — PREDNISONE 20 MG PO TABS: 40.0000 mg | ORAL_TABLET | Freq: Every day | ORAL | 0 refills | Status: AC

## 2022-11-07 NOTE — ED Triage Notes (Signed)
Pt is here for sinus congestion, headache , cough and nasal drainage x 5 days

## 2022-11-07 NOTE — ED Provider Notes (Signed)
[facility]    CSN: [id number] Arrival date & time: 11/07/22  1101      History   Chief Complaint Chief Complaint  Patient presents with   Headache   Facial Pain   Cough    HPI Cynthia Little is a 31 y.o. female.   Patient presents today with a 5-day history of URI symptoms including congestion, cough, sinus pressure, fatigue, malaise, 1 episode of epistaxis.  Denies any chest pain, shortness of breath, nausea, vomiting, fever.  Denies any known sick contacts.  She did take an at-home COVID test when symptoms began that was negative.  She has had COVID in the past several years ago.  She has a history of asthma and has been using her albuterol inhaler more frequently since symptom onset.  She denies previous hospitalization or intubation related to asthma.  Denies any recent steroids or antibiotics.  She has tried over-the-counter medications without improvement of symptoms.    Past Medical History:  Diagnosis Date   Anemia    Asthma    Bronchitis     Patient Active Problem List   Diagnosis Date Noted   Morbid obesity ([place]) 12/17/2020   Vitamin D deficiency 12/17/2020   Tenderness 06/21/2020    Past Surgical History:  Procedure Laterality Date   CALCANEAL OSTEOTOMY Left 05/17/2019   Procedure: CALCANEAL OSTECTOMY;  Surgeon: Evelina Bucy, DPM;  Location: [facility];  Service: Podiatry;  Laterality: Left;   CESAREAN SECTION  08/11/2012   Procedure: CESAREAN SECTION;  Surgeon: Melina Schools, MD;  Location: [facility];  Service: Gynecology;  Laterality: N/A;   CHEILECTOMY Left 05/17/2019   Procedure: CHEILECTOMY LEFT FOOT;  Surgeon: Evelina Bucy, DPM;  Location: [facility];  Service: Podiatry;  Laterality: Left;   GASTROC RECESSION EXTREMITY Left 05/17/2019   Procedure: GASTROCNEMIUS RECESS;  Surgeon: Evelina Bucy, DPM;  Location: [facility];  Service: Podiatry;  Laterality: Left;   HAMMER TOE SURGERY  Left 05/17/2019   Procedure: HAMMER TOE CORRECTION FIFTH;  Surgeon: Evelina Bucy, DPM;  Location: [facility];  Service: Podiatry;  Laterality: Left;   MINOR CLOSED MANIPULATION Left 05/17/2019   Procedure: MINOR CLOSED MANIPULATION;  Surgeon: Evelina Bucy, DPM;  Location: [facility];  Service: Podiatry;  Laterality: Left;   NO PAST SURGERIES      OB History     Gravida  2   Para  1   Term  1   Preterm  0   AB  1   Living  1      SAB  1   IAB  0   Ectopic  0   Multiple  0   Live Births  1            Home Medications    Prior to Admission medications   Medication Sig Start Date End Date Taking? Authorizing Provider  predniSONE (DELTASONE) 20 MG tablet Take 2 tablets (40 mg total) by mouth daily for 4 days. 11/07/22 11/11/22 Yes Keajah Killough K, PA-C  promethazine-dextromethorphan (PROMETHAZINE-DM) 6.25-15 MG/5ML syrup Take 5 mLs by mouth 2 (two) times daily as needed for cough. 11/07/22  Yes Erol Flanagin K, PA-C  albuterol (VENTOLIN HFA) 108 (90 Base) MCG/ACT inhaler Inhale 2 puffs into the lungs every 4 (four) hours as needed. 11/07/22   Paytience Bures K, PA-C  B Complex-C-Folic Acid (B COMPLEX-VITAMIN C-FOLIC ACID) 1 MG tablet Take by mouth.  [provider]  Collagen 500 MG CAPS Take by mouth.    Joline Salt, RN  CRANBERRY PO Take by mouth.    [provider]  [facility] EVENING PRIMROSE OIL PO Take by mouth.    [provider]  hydroquinone 4 % cream  08/28/20   [provider]  ibuprofen (ADVIL) 600 MG tablet ibuprofen 600 mg tablet    [provider]  Multiple Vitamin (MULTIVITAMIN) capsule Take 1 capsule by mouth daily.    [provider]  Specialty Vitamins Products (BIOTIN PLUS KERATIN) 10000-100 MCG-MG TABS Take by mouth.    Joline Salt, RN  tretinoin (RETIN-A) 0.025 % cream  08/28/20   [provider]  Vitamin D, Ergocalciferol, (DRISDOL) 1.25 MG (50000 UNIT)  CAPS capsule Take 1 capsule (50,000 Units total) by mouth every 7 (seven) days. 10/02/22   Billie Ruddy, MD  enoxaparin (LOVENOX) 40 MG/0.4ML injection Inject 0.4 mLs (40 mg total) into the skin every 12 (twelve) hours. 05/17/19 01/23/20  Evelina Bucy, DPM    Family History Family History  Problem Relation Age of Onset   Diabetes Father     Social History Social History   Tobacco Use   Smoking status: Never   Smokeless tobacco: Never  Vaping Use   Vaping Use: Never used  Substance Use Topics   Alcohol use: No    Comment: rare   Drug use: No     Allergies   Cephalexin   Review of Systems Review of Systems  Constitutional:  Positive for activity change and fatigue. Negative for appetite change and fever.  HENT:  Positive for congestion, postnasal drip, sinus pressure and sore throat. Negative for sneezing.   Respiratory:  Positive for cough. Negative for shortness of breath.   Cardiovascular:  Negative for chest pain.  Gastrointestinal:  Negative for abdominal pain, diarrhea, nausea and vomiting.  Neurological:  Positive for headaches. Negative for dizziness and light-headedness.     Physical Exam Triage Vital Signs ED Triage Vitals  Enc Vitals Group     BP 11/07/22 1143 106/74     Pulse Rate 11/07/22 1143 80     Resp 11/07/22 1143 12     Temp 11/07/22 1143 98 F (36.7 C)     Temp Source 11/07/22 1143 Oral     SpO2 11/07/22 1143 96 %     Weight --      Height --      Head Circumference --      Peak Flow --      Pain Score 11/07/22 1141 7     Pain Loc --      Pain Edu? --      Excl. in [place]? --    No data found.  Updated Vital Signs BP 106/74 (BP Location: Left Arm)   Pulse 80   Temp 98 F (36.7 C) (Oral)   Resp 12   LMP 11/04/2022   SpO2 96%   Visual Acuity Right Eye Distance:   Left Eye Distance:   Bilateral Distance:    Right Eye Near:   Left Eye Near:    Bilateral Near:     Physical Exam Vitals reviewed.  Constitutional:       General: She is awake. She is not in acute distress.    Appearance: Normal appearance. She is well-developed. She is not ill-appearing.     Comments: Very pleasant female presented age no acute distress sitting comfortably in exam room  HENT:  Head: Normocephalic and atraumatic.     Right Ear: Tympanic membrane, ear canal and external ear normal. Tympanic membrane is not erythematous or bulging.     Left Ear: Tympanic membrane, ear canal and external ear normal. Tympanic membrane is not erythematous or bulging.     Nose:     Right Sinus: Frontal sinus tenderness present. No maxillary sinus tenderness.     Left Sinus: Frontal sinus tenderness present. No maxillary sinus tenderness.     Mouth/Throat:     Pharynx: Uvula midline. No oropharyngeal exudate or posterior oropharyngeal erythema.  Cardiovascular:     Rate and Rhythm: Normal rate and regular rhythm.     Heart sounds: Normal heart sounds, S1 normal and S2 normal. No murmur heard. Pulmonary:     Effort: Pulmonary effort is normal.     Breath sounds: Normal breath sounds. No wheezing, rhonchi or rales.     Comments: Clear to auscultation bilateral Psychiatric:        Behavior: Behavior is cooperative.      UC Treatments / Results  Labs (all labs ordered are listed, but only abnormal results are displayed) Labs Reviewed - No data to display  EKG   Radiology No results found.  Procedures Procedures (including critical care time)  Medications Ordered in UC Medications - No data to display  Initial Impression / Assessment and Plan / UC Course  I have reviewed the triage vital signs and the nursing notes.  Pertinent labs & imaging results that were available during my care of the patient were reviewed by me and considered in my medical decision making (see chart for details).     Patient is well-appearing, afebrile, nontoxic, nontachycardic.  No indication for viral testing as she has been symptomatic for 5 days and  by the time we received the results that we would not change her management.  No evidence of acute infection on physical exam that would warrant initiation of antibiotics.  Suspect URI has triggered asthma.  Refill of albuterol was sent to pharmacy with instruction to use this every 4-6 hours as needed.  Prednisone burst of 40 mg x 4 days sent.  She was instructed not to take NSAIDs with this medication due to risk of GI bleeding.  Can use Tylenol, Mucinex for additional symptom relief.  Given recent episode of epistaxis recommend that she avoid Flonase but use a humidifier as well as nasal saline to manage symptoms.  If she has recurrent episode she is to return for reevaluation.  Promethazine DM was sent for cough.  Discussed that this can be sedating and she is not to drive drink alcohol while taking this medication.  She is to rest and drink plenty of fluid.  Discussed that if her symptoms are proving by next week she is to return for reevaluation.  If she has any worsening symptoms she needs to be seen immediately.  Strict return precautions given.  Work excuse note provided.  Final Clinical Impressions(s) / UC Diagnoses   Final diagnoses:  Mild intermittent asthma with acute exacerbation  Upper respiratory tract infection, unspecified type     Discharge Instructions      I suspect that you had a upper respiratory infection virus that has triggered her asthma.  Take prednisone 40 mg for 4 days.  Do not take NSAIDs with this medication including aspirin, ibuprofen/Advil, naproxen/Aleve.  Use Tylenol, Mucinex, Flonase for additional symptom relief.  Take Promethazine DM for cough.  This will make you sleepy do  not drive drink alcohol while taking it.  If your symptoms are not improving by next week you should return for reevaluation.  If you have any worsening symptoms you need to be seen immediately.     ED Prescriptions     Medication Sig Dispense Auth. Provider   albuterol (VENTOLIN HFA)  108 (90 Base) MCG/ACT inhaler Inhale 2 puffs into the lungs every 4 (four) hours as needed. 18 g Sai Zinn K, PA-C   predniSONE (DELTASONE) 20 MG tablet Take 2 tablets (40 mg total) by mouth daily for 4 days. 8 tablet Marce Schartz K, PA-C   promethazine-dextromethorphan (PROMETHAZINE-DM) 6.25-15 MG/5ML syrup Take 5 mLs by mouth 2 (two) times daily as needed for cough. 118 mL Kelliann Pendergraph K, PA-C      PDMP not reviewed this encounter.   Terrilee Croak, PA-C 11/07/22 1205

## 2022-11-07 NOTE — Discharge Instructions (Addendum)
I suspect that you had a upper respiratory infection virus that has triggered her asthma.  Take prednisone 40 mg for 4 days.  Do not take NSAIDs with this medication including aspirin, ibuprofen/Advil, naproxen/Aleve.  Use Tylenol, Mucinex, Flonase for additional symptom relief.  Take Promethazine DM for cough.  This will make you sleepy do not drive drink alcohol while taking it.  If your symptoms are not improving by next week you should return for reevaluation.  If you have any worsening symptoms you need to be seen immediately.

## 2022-11-08 ENCOUNTER — Ambulatory Visit (HOSPITAL_COMMUNITY): Payer: Commercial Managed Care - PPO

## 2022-11-11 ENCOUNTER — Ambulatory Visit (INDEPENDENT_AMBULATORY_CARE_PROVIDER_SITE_OTHER): Payer: Commercial Managed Care - PPO | Admitting: Family Medicine

## 2022-11-11 ENCOUNTER — Encounter: Payer: Self-pay | Admitting: Family Medicine

## 2022-11-11 VITALS — BP 116/68 | HR 89 | Temp 98.7°F | Wt 264.0 lb

## 2022-11-11 DIAGNOSIS — J0140 Acute pansinusitis, unspecified: Secondary | ICD-10-CM | POA: Diagnosis not present

## 2022-11-11 DIAGNOSIS — T3695XA Adverse effect of unspecified systemic antibiotic, initial encounter: Secondary | ICD-10-CM

## 2022-11-11 DIAGNOSIS — R0982 Postnasal drip: Secondary | ICD-10-CM

## 2022-11-11 DIAGNOSIS — J4521 Mild intermittent asthma with (acute) exacerbation: Secondary | ICD-10-CM | POA: Diagnosis not present

## 2022-11-11 DIAGNOSIS — B379 Candidiasis, unspecified: Secondary | ICD-10-CM

## 2022-11-11 MED ORDER — FLUCONAZOLE 150 MG PO TABS: ORAL_TABLET | ORAL | 0 refills | Status: DC

## 2022-11-11 MED ORDER — AMOXICILLIN-POT CLAVULANATE 500-125 MG PO TABS: 1.0000 | ORAL_TABLET | Freq: Two times a day (BID) | ORAL | 0 refills | Status: AC

## 2022-11-11 MED ORDER — CETIRIZINE HCL 10 MG PO TABS: 10.0000 mg | ORAL_TABLET | Freq: Every day | ORAL | 0 refills | Status: DC

## 2022-11-11 NOTE — Progress Notes (Signed)
Subjective:    Patient ID: Cynthia Little, female    DOB: [birth year], 31 y.o.   MRN: [id number]  Chief Complaint  Patient presents with   Follow-up    Still having HA coughing up green phlegm, and chest is tight, nose is dry has been using saline Finished prednisone, taking cough med and generic sudafed, and using inhaler... not feeling     HPI Patient was seen today for UC f/u.  Seen 11/18 for epistaxis, dry scratchy throat.  Dx mild asthma and URI.  Given prednisone, promethazine, albuterol inhaler.  Had some improvement in symptoms, then had returned with chest tightness, productive cough, frontal pressure, ear pressure/popping/muffled sensation. Using albuterol inhaler regularly.  Past Medical History:  Diagnosis Date   Anemia    Asthma    Bronchitis     Allergies  Allergen Reactions   Cephalexin Hives and Other (See Comments)    Hyperventilate     ROS General: Denies fever, chills, night sweats, changes in weight, changes in appetite HEENT: Denies headaches, ear pain, changes in vision, rhinorrhea, sore throat + headaches, ear pain/pressure/mufflled sound, forehead pressure CV: Denies CP, palpitations, SOB, orthopnea   Pulm: Denies SOB  +chest tightness, wheezing, cough GI: Denies abdominal pain, nausea, vomiting, diarrhea, constipation GU: Denies dysuria, hematuria, frequency, vaginal discharge Msk: Denies muscle cramps, joint pains Neuro: Denies weakness, numbness, tingling Skin: Denies rashes, bruising Psych: Denies depression, anxiety, hallucinations      Objective:    Blood pressure 116/68, pulse 89, temperature 98.7 F (37.1 C), temperature source Oral, weight 264 lb (119.7 kg), last menstrual period 11/04/2022, SpO2 97 %, unknown if currently breastfeeding.  Gen. Pleasant, well-nourished, in no distress, normal affect   HEENT: [place]/AT, face symmetric, conjunctiva clear, no scleral icterus, PERRLA, EOMI, TTP over right maxillary and right frontal sinuses.   Nares patent with clear drainage, nasal turbinates boggy, pharynx with clear drainage, no erythema or exudate.  TMs full bilaterally.  No cervical lymphadenopathy. Lungs: no accessory muscle use, CTAB, no wheezes or rales Cardiovascular: RRR, no m/r/g, no peripheral edema Neuro:  A&Ox3, CN II-XII intact, normal gait Skin:  Warm, no lesions/ rash   Wt Readings from Last 3 Encounters:  11/11/22 264 lb (119.7 kg)  10/01/22 259 lb (117.5 kg)  09/09/22 263 lb (119.3 kg)    Lab Results  Component Value Date   WBC 7.5 10/01/2022   HGB 12.0 10/01/2022   HCT 36.1 10/01/2022   PLT 249.0 10/01/2022   GLUCOSE 96 10/01/2022   CHOL 130 10/01/2022   TRIG 96.0 10/01/2022   HDL 31.40 (L) 10/01/2022   LDLCALC 80 10/01/2022   ALT 18 10/01/2022   AST 16 10/01/2022   NA 137 10/01/2022   K 4.0 10/01/2022   CL 106 10/01/2022   CREATININE 0.82 10/01/2022   BUN 7 10/01/2022   CO2 24 10/01/2022   TSH 2.62 10/01/2022   HGBA1C 5.6 10/01/2022    Assessment/Plan:  Acute pansinusitis, recurrence not specified - Plan: amoxicillin-clavulanate (AUGMENTIN) 500-125 MG tablet, cetirizine (ZYRTEC) 10 MG tablet  Antibiotic-induced yeast infection - Plan: fluconazole (DIFLUCAN) 150 MG tablet  Post-nasal drainage - Plan: cetirizine (ZYRTEC) 10 MG tablet  Mild intermittent asthma with exacerbation  URI symptoms worsened progressing into sinusitis.  Start ABX.  Patient has history of rash with cephalexin however tolerates penicillins without issue.  Augmentin sent to pharmacy.  Diflucan also sent for antibiotic induced yeast infections.  Discussed using antihistamine such as Zyrtec for postnasal drainage.  Continue using albuterol  inhaler as needed.  Given strict precautions.  F/u as needed  Grier Mitts, MD

## 2022-11-20 ENCOUNTER — Telehealth: Payer: Self-pay

## 2022-11-20 ENCOUNTER — Other Ambulatory Visit: Payer: Self-pay

## 2022-11-20 ENCOUNTER — Encounter (HOSPITAL_BASED_OUTPATIENT_CLINIC_OR_DEPARTMENT_OTHER): Payer: Self-pay | Admitting: Emergency Medicine

## 2022-11-20 ENCOUNTER — Emergency Department (HOSPITAL_BASED_OUTPATIENT_CLINIC_OR_DEPARTMENT_OTHER): Payer: No Typology Code available for payment source | Admitting: Radiology

## 2022-11-20 ENCOUNTER — Emergency Department (HOSPITAL_BASED_OUTPATIENT_CLINIC_OR_DEPARTMENT_OTHER)
Admission: EM | Admit: 2022-11-20 | Discharge: 2022-11-20 | Disposition: A | Discharge status: Home / Self Care | Payer: No Typology Code available for payment source | Attending: Emergency Medicine | Admitting: Emergency Medicine

## 2022-11-20 DIAGNOSIS — R059 Cough, unspecified: Secondary | ICD-10-CM | POA: Diagnosis present

## 2022-11-20 DIAGNOSIS — J189 Pneumonia, unspecified organism: Secondary | ICD-10-CM | POA: Insufficient documentation

## 2022-11-20 DIAGNOSIS — Z20822 Contact with and (suspected) exposure to covid-19: Secondary | ICD-10-CM | POA: Insufficient documentation

## 2022-11-20 DIAGNOSIS — J45909 Unspecified asthma, uncomplicated: Secondary | ICD-10-CM | POA: Insufficient documentation

## 2022-11-20 LAB — RESP PANEL BY RT-PCR (FLU A&B, COVID) ARPGX2
Influenza A by PCR: NEGATIVE
Influenza B by PCR: NEGATIVE
SARS Coronavirus 2 by RT PCR: NEGATIVE

## 2022-11-20 MED ORDER — AZITHROMYCIN 250 MG PO TABS: 250.0000 mg | ORAL_TABLET | Freq: Every day | ORAL | 0 refills | Status: DC

## 2022-11-20 MED ORDER — FLUCONAZOLE 150 MG PO TABS
150.0000 mg | ORAL_TABLET | Freq: Once | ORAL | 0 refills | Status: AC
Start: 2022-11-20 — End: 2022-11-20

## 2022-11-20 NOTE — ED Provider Notes (Signed)
[facility] EMERGENCY DEPT Provider Note   CSN: [id number] Arrival date & time: 11/20/22  1012     History  Chief Complaint  Patient presents with   Cough    Cynthia Little is a 31 y.o. female.  HPI 31 year old female with a history of seasonal asthma presents with cough and fever.  She has been dealing with illness since 11/18.  Went to urgent care was told she had a URI and was prescribed cough medicine and given steroids.  4 days later was told by her doctor she had a sinus infection and put on amoxicillin.  However last night developed a fever of 101.  Over the past couple days she has had cough that is productive of green sputum.  Felt a little short of breath when coughing.  Sore throat seems to be gone.  Home Medications Prior to Admission medications   Medication Sig Start Date End Date Taking? Authorizing Provider  azithromycin (ZITHROMAX) 250 MG tablet Take 1 tablet (250 mg total) by mouth daily. Take first 2 tablets together, then 1 every day until finished. 11/20/22  Yes Sherwood Gambler, MD  albuterol (VENTOLIN HFA) 108 (90 Base) MCG/ACT inhaler Inhale 2 puffs into the lungs every 4 (four) hours as needed. 11/07/22   Raspet, Erin K, PA-C  B Complex-C-Folic Acid (B COMPLEX-VITAMIN C-FOLIC ACID) 1 MG tablet Take by mouth.    [provider]  cetirizine (ZYRTEC) 10 MG tablet Take 1 tablet (10 mg total) by mouth daily. 11/11/22   Billie Ruddy, MD  Collagen 500 MG CAPS Take by mouth.    Joline Salt, RN  CRANBERRY PO Take by mouth.    [provider]  [facility] EVENING PRIMROSE OIL PO Take by mouth.    [provider]  fluconazole (DIFLUCAN) 150 MG tablet Take 1 tab now.  Repeat dose in 3 days for continued symptoms. 11/11/22   Billie Ruddy, MD  hydroquinone 4 % cream  08/28/20   [provider]  ibuprofen (ADVIL) 600 MG tablet ibuprofen 600 mg tablet    [provider]  Multiple Vitamin (MULTIVITAMIN) capsule  Take 1 capsule by mouth daily.    [provider]  promethazine-dextromethorphan (PROMETHAZINE-DM) 6.25-15 MG/5ML syrup Take 5 mLs by mouth 2 (two) times daily as needed for cough. 11/07/22   Raspet, Derry Skill, PA-C  Specialty Vitamins Products (BIOTIN PLUS KERATIN) 10000-100 MCG-MG TABS Take by mouth.    Joline Salt, RN  tretinoin (RETIN-A) 0.025 % cream  08/28/20   [provider]  Vitamin D, Ergocalciferol, (DRISDOL) 1.25 MG (50000 UNIT) CAPS capsule Take 1 capsule (50,000 Units total) by mouth every 7 (seven) days. 10/02/22   Billie Ruddy, MD  enoxaparin (LOVENOX) 40 MG/0.4ML injection Inject 0.4 mLs (40 mg total) into the skin every 12 (twelve) hours. 05/17/19 01/23/20  Evelina Bucy, DPM      Allergies    Cephalexin    Review of Systems   Review of Systems  Constitutional:  Positive for fever.  HENT:  Negative for sore throat.   Respiratory:  Positive for cough and shortness of breath (with coughing only).     Physical Exam Updated Vital Signs BP 137/85 (BP Location: Left Arm)   Pulse (!) 103   Temp 99.5 F (37.5 C) (Oral)   Resp 15   Ht '5\' 4"'$  (1.626 m)   Wt 119 kg   LMP 11/04/2022 (Approximate)   SpO2 99%   BMI 45.03 kg/m  Physical Exam Vitals and nursing note reviewed.  Constitutional:      Appearance: She is well-developed. She is obese.  HENT:     Head: Normocephalic and atraumatic.  Cardiovascular:     Rate and Rhythm: Normal rate and regular rhythm.     Heart sounds: Normal heart sounds.  Pulmonary:     Effort: Pulmonary effort is normal.     Breath sounds: Normal breath sounds. No wheezing, rhonchi or rales.  Abdominal:     General: There is no distension.  Skin:    General: Skin is warm and dry.  Neurological:     Mental Status: She is alert.     ED Results / Procedures / Treatments   Labs (all labs ordered are listed, but only abnormal results are displayed) Labs Reviewed  RESP PANEL BY RT-PCR (FLU A&B, COVID) [id number]  POC  URINE PREG, ED    EKG None  Radiology DG Chest 2 View  Result Date: 11/20/2022 CLINICAL DATA:  Cough EXAM: CHEST - 2 VIEW COMPARISON:  11/20/2009 CXR FINDINGS: No pleural effusion. No pneumothorax. Unchanged cardiac and mediastinal contours. No focal airspace opacity. No displaced rib fractures. Visualized upper abdomen is unremarkable. Vertebral body heights are maintained. IMPRESSION: No active cardiopulmonary disease. Electronically Signed   By: Marin Roberts [name]   On: 11/20/2022 10:58    Procedures Procedures    Medications Ordered in ED Medications - No data to display  ED Course/ Medical Decision Making/ A&P                           Medical Decision Making Amount and/or Complexity of Data Reviewed Radiology: ordered.  Risk Prescription drug management.   Chest x-ray images viewed interpreted by myself.  No evidence of pneumonia.  COVID/flu testing negative.  I question whether she has had a viral illness in the time but now with a new fever could have a bacterial illness on top of this.  We also discussed this could just be unfortunate timing and she could have another viral illness at the same time.  We discussed options and will treat with azithromycin.  An atypical infection could cause multiple weeks of symptoms as well.  Otherwise, low suspicion for sepsis or serious bacterial illness.  We also discussed over-the-counter remedies for her symptoms.  Will discharge home to follow-up with PCP.        Final Clinical Impression(s) / ED Diagnoses Final diagnoses:  Atypical pneumonia    Rx / DC Orders ED Discharge Orders          Ordered    azithromycin (ZITHROMAX) 250 MG tablet  Daily        11/20/22 1127              Sherwood Gambler, MD 11/20/22 1133

## 2022-11-20 NOTE — ED Triage Notes (Signed)
Pt reports feeling bad on 11/18 and diagnosed with URI, prescribed medications. Pt seen at PCP 11/22 and diagnosed with sinus infection. Pt has been taking abx. Began having productive cough since Sunday. Home covid test negative last night.

## 2022-11-20 NOTE — ED Notes (Signed)
Dc instructions and scripts reviewed with pt no questions or concerns at this time. Will follow up with pcp in 3 days.

## 2022-11-20 NOTE — Telephone Encounter (Signed)
Transition Care Management Follow-up Telephone Call Date of discharge and from where: 11/20/22 from [facility] for Pneumonia, unspecified organism How have you been since you were released from the hospital? Pt states she's just trying to rest. Has fever; has taken tylenol & abx Any questions or concerns? No  Items Reviewed: Did the pt receive and understand the discharge instructions provided? Yes  Medications obtained and verified? Yes  Other? No  Any new allergies since your discharge? No    Follow up appointments reviewed:  PCP Hospital f/u appt confirmed? Yes  Scheduled to see Dr Volanda Napoleon on 11/23/22 @ [facility] f/u appt confirmed?  N/a   Are transportation arrangements needed? No  If their condition worsens, is the pt aware to call PCP or go to the Emergency Dept.? Yes Was the patient provided with contact information for the PCP's office or ED? Yes Was to pt encouraged to call back with questions or concerns? Yes

## 2022-11-20 NOTE — Discharge Instructions (Addendum)
If you develop high fever, severe cough or cough with blood, trouble breathing, severe headache, neck pain/stiffness, vomiting, or any other new/concerning symptoms then return to the ER for evaluation  

## 2022-11-23 ENCOUNTER — Ambulatory Visit (INDEPENDENT_AMBULATORY_CARE_PROVIDER_SITE_OTHER): Payer: Commercial Managed Care - PPO | Admitting: Family Medicine

## 2022-11-23 ENCOUNTER — Other Ambulatory Visit: Payer: No Typology Code available for payment source

## 2022-11-23 ENCOUNTER — Encounter: Payer: Self-pay | Admitting: Family Medicine

## 2022-11-23 ENCOUNTER — Other Ambulatory Visit: Payer: Self-pay

## 2022-11-23 VITALS — BP 100/70 | HR 105 | Temp 99.0°F | Wt 260.8 lb

## 2022-11-23 DIAGNOSIS — R0602 Shortness of breath: Secondary | ICD-10-CM

## 2022-11-23 DIAGNOSIS — R000 Tachycardia, unspecified: Secondary | ICD-10-CM

## 2022-11-23 DIAGNOSIS — J189 Pneumonia, unspecified organism: Secondary | ICD-10-CM

## 2022-11-23 DIAGNOSIS — R052 Subacute cough: Secondary | ICD-10-CM

## 2022-11-23 DIAGNOSIS — R509 Fever, unspecified: Secondary | ICD-10-CM

## 2022-11-23 LAB — CBC WITH DIFFERENTIAL/PLATELET
Basophils Absolute: 0.1 10*3/uL (ref 0.0–0.1)
Basophils Relative: 0.6 % (ref 0.0–3.0)
Eosinophils Absolute: 0.4 10*3/uL (ref 0.0–0.7)
Eosinophils Relative: 5 % (ref 0.0–5.0)
HCT: 38 % (ref 36.0–46.0)
Hemoglobin: 12.6 g/dL (ref 12.0–15.0)
Lymphocytes Relative: 17.9 % (ref 12.0–46.0)
Lymphs Abs: 1.6 10*3/uL (ref 0.7–4.0)
MCHC: 33.1 g/dL (ref 30.0–36.0)
MCV: 79.2 fl (ref 78.0–100.0)
Monocytes Absolute: 0.5 10*3/uL (ref 0.1–1.0)
Monocytes Relative: 6.1 % (ref 3.0–12.0)
Neutro Abs: 6.2 10*3/uL (ref 1.4–7.7)
Neutrophils Relative %: 70.4 % (ref 43.0–77.0)
Platelets: 242 10*3/uL (ref 150.0–400.0)
RBC: 4.79 Mil/uL (ref 3.87–5.11)
RDW: 15.4 % (ref 11.5–15.5)
WBC: 8.7 10*3/uL (ref 4.0–10.5)

## 2022-11-23 LAB — COMPREHENSIVE METABOLIC PANEL
ALT: 15 U/L (ref 0–35)
AST: 13 U/L (ref 0–37)
Albumin: 4.2 g/dL (ref 3.5–5.2)
Alkaline Phosphatase: 44 U/L (ref 39–117)
BUN: 4 mg/dL — ABNORMAL LOW (ref 6–23)
CO2: 23 mEq/L (ref 19–32)
Calcium: 8.7 mg/dL (ref 8.4–10.5)
Chloride: 106 mEq/L (ref 96–112)
Creatinine, Ser: 0.83 mg/dL (ref 0.40–1.20)
GFR: 92.34 mL/min (ref >60.00)
Glucose, Bld: 117 mg/dL — ABNORMAL HIGH (ref 70–99)
Potassium: 3.9 mEq/L (ref 3.5–5.1)
Sodium: 136 mEq/L (ref 135–145)
Total Bilirubin: 0.3 mg/dL (ref 0.2–1.2)
Total Protein: 7.4 g/dL (ref 6.0–8.3)

## 2022-11-23 LAB — BRAIN NATRIURETIC PEPTIDE: Pro B Natriuretic peptide (BNP): 11 pg/mL (ref 0.0–100.0)

## 2022-11-23 LAB — D-DIMER, QUANTITATIVE: D-Dimer, Quant: 0.95 mcg/mL FEU — ABNORMAL HIGH (ref <0.50)

## 2022-11-23 MED ORDER — LEVOFLOXACIN 500 MG PO TABS: 500.0000 mg | ORAL_TABLET | Freq: Every day | ORAL | 0 refills | Status: AC

## 2022-11-23 NOTE — Progress Notes (Signed)
Subjective:    Patient ID: Cynthia Little, female    DOB: 01/14/1991, 31 y.o.   MRN: [id number]  Chief Complaint  Patient presents with   Follow-up   Cough    Coughing up phlegms-green. Pain on eyes, chest congestion, rattling and wheezing, fever. Fatigue and weakness. Pt reports she has been sick since 11/07/2022. Developed fever on 11/30 and went to ED on 12/30. Pt states she is on z-pak, for 5 days  Pt accompanied by her mother.  HPI Patient was seen today for ongoing illness.  Seen at [facility] on 11/18 for viral URI x 5 days. Treated with prednisone, promethazine, and albuterol.  Seen 11/11/2022 by this provider for continued symptoms, treated with Augmentin for sinusitis.  Patient developed fever, Tmax 101.8 F on 11/30, seen in ED on 12/1.  Flu and COVID testing negative.  CXR negative but started on azithromycin for possible atypical pneumonia.    Pt states temp still elevated.  Cough now productive with greenish sputum and soreness in chest.  Pt with SOB, DOE, fatigue, wheezing, decreased appetite, insomnia, dizziness, pressure in forehead.  Unable to sleep laying flat.  Pt concerned about getting her son sick.  Denies calf pain, CP, neck pain, increased HA pain with standing.   Past Medical History:  Diagnosis Date   Anemia    Asthma    Bronchitis     Allergies  Allergen Reactions   Cephalexin Hives and Other (See Comments)    Hyperventilate     ROS General: Denies chills, night sweats, changes in weight +fatigue, fever, deceased appetite. HEENT: Denies ear pain, changes in vision, rhinorrhea, sore throat  +HA CV: Denies CP, palpitations, SOB, orthopnea +SOB Pulm: +productive cough, wheezing, SOB GI: Denies abdominal pain, nausea, vomiting, diarrhea, constipation GU: Denies dysuria, hematuria, frequency, vaginal discharge Msk: Denies muscle cramps, joint pains Neuro: Denies weakness, numbness, tingling Skin: Denies rashes, bruising Psych: Denies depression, anxiety,  hallucinations    Objective:    Blood pressure 100/70, pulse (!) 105, temperature 99 F (37.2 C), temperature source Oral, weight 260 lb 12.8 oz (118.3 kg), last menstrual period 11/04/2022, SpO2 96 %, unknown if currently breastfeeding.  Gen. Pleasant, well-nourished, in no distress, normal affect   HEENT: /AT, face symmetric, conjunctiva clear, no scleral icterus, PERRLA, EOMI, nares patent without drainage, TMs normal b/l Lungs: cough, no accessory muscle use, no wheezes, rales, or rhonchi Cardiovascular: Tachycardia, no m/r/g, no peripheral edema Musculoskeletal: Negative No deformities, no cyanosis or clubbing, normal tone Neuro:  A&Ox3, CN II-XII intact, normal gait Skin:  Warm, no lesions/ rash  BP Readings from Last 3 Encounters:  11/23/22 100/70  11/20/22 130/79  11/11/22 116/68     Wt Readings from Last 3 Encounters:  11/23/22 260 lb 12.8 oz (118.3 kg)  11/20/22 262 lb 5.6 oz (119 kg)  11/11/22 264 lb (119.7 kg)    Lab Results  Component Value Date   WBC 7.5 10/01/2022   HGB 12.0 10/01/2022   HCT 36.1 10/01/2022   PLT 249.0 10/01/2022   GLUCOSE 96 10/01/2022   CHOL 130 10/01/2022   TRIG 96.0 10/01/2022   HDL 31.40 (L) 10/01/2022   LDLCALC 80 10/01/2022   ALT 18 10/01/2022   AST 16 10/01/2022   NA 137 10/01/2022   K 4.0 10/01/2022   CL 106 10/01/2022   CREATININE 0.82 10/01/2022   BUN 7 10/01/2022   CO2 24 10/01/2022   TSH 2.62 10/01/2022   HGBA1C 5.6 10/01/2022    Assessment/Plan:  Tachycardia - Plan: CMP, D-dimer, Quantitative  Subacute cough - Plan: CBC with Differential/Platelet, CMP  SOB (shortness of breath) - Plan: Brain Natriuretic Peptide, CBC with Differential/Platelet  Atypical pneumonia - Plan: levofloxacin (LEVAQUIN) 500 MG tablet  Fever, unspecified fever cause  Concern for ongoing infectious process given lack of response to several rounds of abx including augmentin and azithromycin over the last few wks.  Though CXR 11/20/22 in  ED negative still concerned for pna and pt becoming septic (tachycardic, bp low normal, and intermittent fever).  Stop azithromycin.  Start levaquin.  Will obtain labs including d-dimer. Given strict ED precautions.  F/u in clinic in 2-3 days.  Grier Mitts, MD

## 2022-11-23 NOTE — Patient Instructions (Signed)
Will have you follow up in clinic in the new 2-3 days to make sure you are getting better.  For worsened symptoms proceed to nearest ED.

## 2022-11-25 ENCOUNTER — Ambulatory Visit (HOSPITAL_BASED_OUTPATIENT_CLINIC_OR_DEPARTMENT_OTHER)
Admission: RE | Admit: 2022-11-25 | Discharge: 2022-11-25 | Disposition: A | Discharge status: Home / Self Care | Payer: Commercial Managed Care - PPO | Source: Ambulatory Visit | Attending: Family Medicine | Admitting: Family Medicine

## 2022-11-25 ENCOUNTER — Telehealth: Payer: Self-pay

## 2022-11-25 ENCOUNTER — Encounter: Payer: Self-pay | Admitting: Family Medicine

## 2022-11-25 ENCOUNTER — Other Ambulatory Visit: Payer: Self-pay | Admitting: Family Medicine

## 2022-11-25 ENCOUNTER — Ambulatory Visit (INDEPENDENT_AMBULATORY_CARE_PROVIDER_SITE_OTHER): Payer: Commercial Managed Care - PPO | Admitting: Family Medicine

## 2022-11-25 ENCOUNTER — Encounter (HOSPITAL_BASED_OUTPATIENT_CLINIC_OR_DEPARTMENT_OTHER): Payer: Self-pay

## 2022-11-25 VITALS — BP 94/70 | HR 100 | Temp 98.9°F | Wt 258.4 lb

## 2022-11-25 DIAGNOSIS — J189 Pneumonia, unspecified organism: Secondary | ICD-10-CM | POA: Diagnosis not present

## 2022-11-25 DIAGNOSIS — R7989 Other specified abnormal findings of blood chemistry: Secondary | ICD-10-CM | POA: Insufficient documentation

## 2022-11-25 DIAGNOSIS — R052 Subacute cough: Secondary | ICD-10-CM | POA: Insufficient documentation

## 2022-11-25 DIAGNOSIS — J4521 Mild intermittent asthma with (acute) exacerbation: Secondary | ICD-10-CM | POA: Diagnosis not present

## 2022-11-25 MED ORDER — HYDROCODONE BIT-HOMATROP MBR 5-1.5 MG/5ML PO SOLN: 5.0000 mL | Freq: Every evening | ORAL | 0 refills | Status: DC | PRN

## 2022-11-25 MED ORDER — IOHEXOL 350 MG/ML SOLN
100.0000 mL | Freq: Once | INTRAVENOUS | Status: AC | PRN
  Administered 2022-11-25: 75 mL via INTRAVENOUS

## 2022-11-25 MED ORDER — IPRATROPIUM-ALBUTEROL 0.5-2.5 (3) MG/3ML IN SOLN
3.0000 mL | Freq: Once | RESPIRATORY_TRACT | Status: AC
  Administered 2022-11-25: 3 mL via RESPIRATORY_TRACT

## 2022-11-25 NOTE — Progress Notes (Signed)
Subjective:    Patient ID: Cynthia Little, female    DOB: 1991/09/18, 31 y.o.   MRN: [id number]  Chief Complaint  Patient presents with   Follow-up    Pt reports she is doing better than when she was seen on Monday. Updates she had nose bleed on Monday after the office visit. Also she has burning sensation in her nasal.   Patient accompanied by her mother.  HPI Patient is a 31 year old female with pmh sig for asthma and h/o anemia who was seen today for 2 day f/u on ongoing resp illness.  Symptoms started 11/13 with pt being seen at [facility] 11/18 for viral URI.  Pt seen 11/11/22 by this provider treated with Augmentin for sinusitis.  Patient presented to ED 12/1 after becoming febrile, Tmax 101.34F.  Started on azithromycin for possible atypical pneumonia.  Pt seen 11/23/22 for continued productive cough, SOB, intermittent fever, fatigue, dizziness.  Azithromycin stopped.  Levaquin started.  Labs obtained including CBC, CMP, D-dimer, BNP.    CTA chest completed today as D-dimer mildly elevated 0.95.  CTA negative for PE.  Left lower lobe pneumonia noted.  Pt notes symptoms improving some since starting Levaquin.  Has a little more energy.  Still getting tired if up moving around too much.  Sleep was better last night than it has been.  Coughing worse at night with episode of sweating/hot flash.  Still having occasional fever.  Past Medical History:  Diagnosis Date   Anemia    Asthma    Bronchitis     Allergies  Allergen Reactions   Cephalexin Hives and Other (See Comments)    Hyperventilate     ROS General: Denies chills, changes in weight + changes in appetite, fatigue, fever, night sweats HEENT: Denies headaches, ear pain, changes in vision, rhinorrhea, sore throat CV: Denies CP, palpitations, SOB, orthopnea Pulm: Denies SOB, cough, wheezing  + cough, SOB GI: Denies abdominal pain, nausea, vomiting, diarrhea, constipation GU: Denies dysuria, hematuria, frequency, vaginal  discharge Msk: Denies muscle cramps, joint pains Neuro: Denies weakness, numbness, tingling Skin: Denies rashes, bruising Psych: Denies depression, anxiety, hallucinations    Objective:    Blood pressure 94/70, pulse 100, temperature 98.9 F (37.2 C), temperature source Oral, weight 258 lb 6.4 oz (117.2 kg), last menstrual period 11/04/2022, SpO2 94 %, unknown if currently breastfeeding.  Gen. Pleasant, well-nourished, in no distress, normal affect   HEENT: [place]/AT, face symmetric, conjunctiva clear, no scleral icterus, PERRLA, EOMI, nares patent without drainage Lungs: no accessory muscle use,  mildly decreased bs in LL base, mild bibasilar wheezing posteriorly, no rales or rhonchi Cardiovascular: RRR, no m/r/g, no peripheral edema Musculoskeletal: No deformities, no cyanosis or clubbing, normal tone Neuro:  A&Ox3, CN II-XII intact, normal gait Skin:  Warm, no lesions/ rash   Wt Readings from Last 3 Encounters:  11/25/22 258 lb 6.4 oz (117.2 kg)  11/23/22 260 lb 12.8 oz (118.3 kg)  11/20/22 262 lb 5.6 oz (119 kg)    Lab Results  Component Value Date   WBC 8.7 11/23/2022   HGB 12.6 11/23/2022   HCT 38.0 11/23/2022   PLT 242.0 11/23/2022   GLUCOSE 117 (H) 11/23/2022   CHOL 130 10/01/2022   TRIG 96.0 10/01/2022   HDL 31.40 (L) 10/01/2022   LDLCALC 80 10/01/2022   ALT 15 11/23/2022   AST 13 11/23/2022   NA 136 11/23/2022   K 3.9 11/23/2022   CL 106 11/23/2022   CREATININE 0.83 11/23/2022   BUN 4 (  L) 11/23/2022   CO2 23 11/23/2022   TSH 2.62 10/01/2022   HGBA1C 5.6 10/01/2022    Assessment/Plan:  Pneumonia of left lower lobe due to infectious organism  Subacute cough - Plan: HYDROcodone bit-homatropine (HYCODAN) 5-1.5 MG/5ML syrup, ipratropium-albuterol (DUONEB) 0.5-2.5 (3) MG/3ML nebulizer solution 3 mL  Mild intermittent asthma with exacerbation - Plan: ipratropium-albuterol (DUONEB) 0.5-2.5 (3) MG/3ML nebulizer solution 3 mL  CTA chest done earlier today for  elevated D-dimer on 11/23/2022 negative for PE.  Left lower lobe opacity concerning for pneumonia noted.  Symptoms starting to improve.  Continue Levaquin.  Continue supportive care with over-the-counter cough/cold medications, hydration, rest, fluids, honey, etc.-Cough syrup as needed nightly.  DuoNeb given in clinic.  Continue albuterol inhaler as needed at home.  Given strict precautions for worsening symptoms.    Will provide patient with a note for work for work from home accommodations starting next Monday.  F/u as needed.  Grier Mitts, MD

## 2022-11-25 NOTE — Telephone Encounter (Signed)
Pt was never seen in [facility]- opened in error

## 2022-11-27 ENCOUNTER — Telehealth: Payer: Self-pay | Admitting: Family Medicine

## 2022-11-27 NOTE — Telephone Encounter (Signed)
Pt is asking for a call back in regards to a "Return to work letter"  Employer needs additional information regarding transportation.

## 2022-11-30 NOTE — Telephone Encounter (Signed)
Patient calling in to check on progress. Advised message came late in the day and to allow time

## 2022-11-30 NOTE — Telephone Encounter (Signed)
Spoke to patient. Patient informs she is requesting a note for her to be able to work at home and to mention her reasons of not able to drive as she doesn't trust herself driving due to coughing and taking hydrocodone cough syrup. In additional, feeling extremely tired or drained. The duration is for a week.   Please advise.

## 2022-12-02 ENCOUNTER — Other Ambulatory Visit: Payer: Self-pay | Admitting: Family Medicine

## 2022-12-02 NOTE — Progress Notes (Signed)
New work note provided detailing reasons for work at home accommodations.

## 2022-12-02 NOTE — Telephone Encounter (Signed)
Patient checking on progress of this letter. States her employer is asking for when she will get this letter.

## 2022-12-02 NOTE — Telephone Encounter (Signed)
Addended work note completed and available via MyChart for patient.

## 2022-12-03 ENCOUNTER — Other Ambulatory Visit: Payer: Self-pay | Admitting: Family Medicine

## 2022-12-03 DIAGNOSIS — J0140 Acute pansinusitis, unspecified: Secondary | ICD-10-CM

## 2022-12-03 DIAGNOSIS — R0982 Postnasal drip: Secondary | ICD-10-CM

## 2022-12-03 NOTE — Telephone Encounter (Signed)
Inform patient of the update info below. Pt responded that she will take a look at it. She states she will reach out to [place] if she has further questions. Patient is aware she can send a message to [place] as well.

## 2022-12-07 ENCOUNTER — Ambulatory Visit (INDEPENDENT_AMBULATORY_CARE_PROVIDER_SITE_OTHER): Payer: Commercial Managed Care - PPO | Admitting: Family Medicine

## 2022-12-07 VITALS — BP 100/70 | HR 95 | Temp 98.9°F | Wt 261.4 lb

## 2022-12-07 DIAGNOSIS — R0981 Nasal congestion: Secondary | ICD-10-CM | POA: Diagnosis not present

## 2022-12-07 DIAGNOSIS — R059 Cough, unspecified: Secondary | ICD-10-CM

## 2022-12-07 LAB — POCT INFLUENZA A/B
Influenza A, POC: NEGATIVE
Influenza B, POC: NEGATIVE

## 2022-12-07 LAB — POC COVID19 BINAXNOW: SARS Coronavirus 2 Ag: NEGATIVE

## 2022-12-07 NOTE — Progress Notes (Signed)
Subjective:    Patient ID: Cynthia Little, female    DOB: Sep 28, 1991, 31 y.o.   MRN: [id number]  Chief Complaint  Patient presents with   Follow-up    Pt reports she had to son winter concert on last Thursday. She feels she had a temperature, headache and recurrent cough.     HPI Patient was seen today for f/u on pna.  Noticed increased cough, HA, and subjective fever after attending her son's concert last Thursday.  Also with increased nasal congestion.  Was starting to feel slightly better s/p LLL pna treated with levaquin.    Past Medical History:  Diagnosis Date   Anemia    Asthma    Bronchitis     Allergies  Allergen Reactions   Cephalexin Hives and Other (See Comments)    Hyperventilate     ROS General: Denies chills, night sweats, changes in weight, changes in appetite +subjective fever. HEENT: Denies  ear pain, changes in vision, rhinorrhea, sore throat +HA, nasal congestion CV: Denies CP, palpitations, SOB, orthopnea Pulm: Denies SOB, wheezing  +cough GI: Denies abdominal pain, nausea, vomiting, diarrhea, constipation GU: Denies dysuria, hematuria, frequency, vaginal discharge Msk: Denies muscle cramps, joint pains Neuro: Denies weakness, numbness, tingling Skin: Denies rashes, bruising Psych: Denies depression, anxiety, hallucinations    Objective:    Blood pressure 100/70, pulse 95, temperature 98.9 F (37.2 C), temperature source Oral, weight 261 lb 6.4 oz (118.6 kg), last menstrual period 11/04/2022, SpO2 98 %, unknown if currently breastfeeding.  Gen. Pleasant, well-nourished, in no distress, normal affect   HEENT: [place]/AT, face symmetric, conjunctiva clear, no scleral icterus, PERRLA, EOMI, nares patent without drainage.  TMs normal b/l Lungs: cough, no accessory muscle use, CTAB, no wheezes or rales Cardiovascular: RRR, no m/r/g, no peripheral edema Neuro:  A&Ox3, CN II-XII intact, normal gait Skin:  Warm, no lesions/ rash   Wt Readings from Last  3 Encounters:  12/07/22 261 lb 6.4 oz (118.6 kg)  11/25/22 258 lb 6.4 oz (117.2 kg)  11/23/22 260 lb 12.8 oz (118.3 kg)    Lab Results  Component Value Date   WBC 8.7 11/23/2022   HGB 12.6 11/23/2022   HCT 38.0 11/23/2022   PLT 242.0 11/23/2022   GLUCOSE 117 (H) 11/23/2022   CHOL 130 10/01/2022   TRIG 96.0 10/01/2022   HDL 31.40 (L) 10/01/2022   LDLCALC 80 10/01/2022   ALT 15 11/23/2022   AST 13 11/23/2022   NA 136 11/23/2022   K 3.9 11/23/2022   CL 106 11/23/2022   CREATININE 0.83 11/23/2022   BUN 4 (L) 11/23/2022   CO2 23 11/23/2022   TSH 2.62 10/01/2022   HGBA1C 5.6 10/01/2022    Assessment/Plan:  Cough, unspecified type - Plan: POC Influenza A/B, POC COVID-19  Nasal congestion - Plan: POC Influenza A/B, POC COVID-19  Residual cough s/p LLL PNA likely exacerbated by deconditioning vs new viral URI.  COVID and flu testing negative.  Continue supportive care with OTC cough/cold meds.  Flonase prn.  Given strict precautions.  F/u prn  Grier Mitts, MD

## 2022-12-28 ENCOUNTER — Encounter: Payer: Self-pay | Admitting: Family Medicine

## 2022-12-28 ENCOUNTER — Ambulatory Visit (INDEPENDENT_AMBULATORY_CARE_PROVIDER_SITE_OTHER): Payer: Commercial Managed Care - PPO | Admitting: Family Medicine

## 2022-12-28 VITALS — BP 100/72 | HR 75 | Temp 98.6°F | Wt 260.0 lb

## 2022-12-28 DIAGNOSIS — B379 Candidiasis, unspecified: Secondary | ICD-10-CM

## 2022-12-28 DIAGNOSIS — T3695XA Adverse effect of unspecified systemic antibiotic, initial encounter: Secondary | ICD-10-CM | POA: Diagnosis not present

## 2022-12-28 DIAGNOSIS — J3489 Other specified disorders of nose and nasal sinuses: Secondary | ICD-10-CM

## 2022-12-28 DIAGNOSIS — J0140 Acute pansinusitis, unspecified: Secondary | ICD-10-CM

## 2022-12-28 LAB — POC COVID19 BINAXNOW: SARS Coronavirus 2 Ag: NEGATIVE

## 2022-12-28 MED ORDER — DOXYCYCLINE HYCLATE 100 MG PO TABS: 100.0000 mg | ORAL_TABLET | Freq: Two times a day (BID) | ORAL | 0 refills | Status: AC

## 2022-12-28 MED ORDER — FLUCONAZOLE 150 MG PO TABS: ORAL_TABLET | ORAL | 1 refills | Status: DC

## 2022-12-28 NOTE — Progress Notes (Signed)
Acute Office Visit  Subjective:     Patient ID: Cynthia Little, female    DOB: 1991-09-23, 32 y.o.   MRN: [id number]  Chief Complaint  Patient presents with   Headache    Pt reports sx of headache, throat irration from postal drainage, coughing every now and then- yellow phlegm. Sx started last Wednesday. Taking Mucinex.     HPI Patient is in today for acute concern.  Pt with headache, sore throat, rhinorrhea, postnasal drainage, puffiness in face, ear popping x 5 days.  Tried Mucinex, sinus medication, and Alka-Seltzer.  Review of Systems  Constitutional:  Positive for malaise/fatigue.  HENT:  Positive for sinus pain and sore throat.        Postnasal drainage, puffiness of face, popping in ears  Respiratory:  Positive for cough.       Objective:    BP 100/72 (BP Location: Right Arm, Patient Position: Sitting, Cuff Size: Large)   Pulse 75   Temp 98.6 F (37 C) (Oral)   Wt 260 lb (117.9 kg)   SpO2 99%   BMI 44.63 kg/m    Physical Exam Constitutional:      Appearance: She is well-developed.  HENT:     Head: Normocephalic and atraumatic.     Right Ear: Tympanic membrane normal. No swelling. Tympanic membrane is not erythematous, retracted or bulging.     Left Ear: Tympanic membrane normal. No swelling. Tympanic membrane is not erythematous, retracted or bulging.     Mouth/Throat:     Mouth: Mucous membranes are moist.  Eyes:     Extraocular Movements: Extraocular movements intact.     Pupils: Pupils are equal, round, and reactive to light.  Cardiovascular:     Rate and Rhythm: Normal rate and regular rhythm.     Heart sounds: Normal heart sounds.  Pulmonary:     Effort: Pulmonary effort is normal.     Breath sounds: No wheezing, rhonchi or rales.  Musculoskeletal:     Cervical back: Normal range of motion.  Skin:    General: Skin is warm and dry.  Neurological:     Mental Status: She is alert.    Results for orders placed or performed in visit on  12/28/22  POC COVID-19 BinaxNow  Result Value Ref Range   SARS Coronavirus 2 Ag Negative Negative       Assessment & Plan:   Problem List Items Addressed This Visit   None Visit Diagnoses     Sinus pressure    -  Primary   Relevant Medications   doxycycline (VIBRA-TABS) 100 MG tablet   Other Relevant Orders   POC COVID-19 BinaxNow (Completed)   Acute pansinusitis, recurrence not specified       Relevant Medications   doxycycline (VIBRA-TABS) 100 MG tablet   fluconazole (DIFLUCAN) 150 MG tablet   Antibiotic-induced yeast infection       Relevant Medications   fluconazole (DIFLUCAN) 150 MG tablet       Meds ordered this encounter  Medications   doxycycline (VIBRA-TABS) 100 MG tablet    Sig: Take 1 tablet (100 mg total) by mouth 2 (two) times daily for 7 days.    Dispense:  14 tablet    Refill:  0   fluconazole (DIFLUCAN) 150 MG tablet    Sig: Take 1 tab now.  Repeat dose in 3 days for continued symptoms.    Dispense:  1 tablet    Refill:  1  Start doxycycline for acute sinusitis.  Advised to use Flonase.  Consider daily OTC antihistamine.  Diflucan for antibiotic induced yeast infection.  COVID testing negative.  For continued or worsening symptoms notify clinic and will place ENT referral given history of chronic sinus infections over the years.  Return if symptoms worsen or fail to improve.  Billie Ruddy, MD

## 2022-12-28 NOTE — Patient Instructions (Signed)
Continue using Flonase.  A prescription for doxycycline 100 mg 1 tab twice a day for the next 7 days was sent to pharmacy to treat sinus infection.  For continued symptoms notify clinic so we can place referral to ENT.

## 2022-12-29 ENCOUNTER — Encounter: Payer: Self-pay | Admitting: Family Medicine

## 2023-01-14 ENCOUNTER — Encounter (INDEPENDENT_AMBULATORY_CARE_PROVIDER_SITE_OTHER): Payer: Self-pay | Admitting: Internal Medicine

## 2023-01-14 ENCOUNTER — Ambulatory Visit (INDEPENDENT_AMBULATORY_CARE_PROVIDER_SITE_OTHER): Payer: Commercial Managed Care - PPO | Admitting: Internal Medicine

## 2023-01-14 VITALS — BP 111/77 | HR 84 | Temp 98.2°F | Ht 64.0 in | Wt 256.0 lb

## 2023-01-14 DIAGNOSIS — R29818 Other symptoms and signs involving the nervous system: Secondary | ICD-10-CM | POA: Diagnosis not present

## 2023-01-14 DIAGNOSIS — E559 Vitamin D deficiency, unspecified: Secondary | ICD-10-CM | POA: Diagnosis not present

## 2023-01-14 DIAGNOSIS — Z6841 Body Mass Index (BMI) 40.0 and over, adult: Secondary | ICD-10-CM | POA: Diagnosis not present

## 2023-01-14 NOTE — Assessment & Plan Note (Signed)
Associated with excess adiposity and may result in leptin resistance and adipogenesis.  Most recent vitamin D levels 23.  She is currently on high-dose vitamin D without any side effects.  We will repeat vitamin D levels with intake labs for goal of 50-60.  Continue vitamin D supplementation

## 2023-01-14 NOTE — Assessment & Plan Note (Signed)
Patient reports having symptoms of loud snoring and daytime fatigue.  She will be formally screened for sleep apnea at her intake lab.  Losing 15% of body weight may improve symptoms but having untreated sleep apnea may also contribute to weight gain so she may benefit from a sleep study.

## 2023-01-14 NOTE — Assessment & Plan Note (Addendum)
We reviewed weight, biometrics, associated medical conditions and contributing factors with patient. She would benefit from weight loss therapy via a modified calorie, low-carb, high-protein nutritional plan tailored to their REE (resting energy expenditure) which will be determined by indirect calorimetry.  We will also assess for cardiometabolic risk and nutritional derangements via fasting serologies at her next appointment.  I reviewed recent labs and there are no significant metabolic derangements other than vitamin D deficiency.

## 2023-01-14 NOTE — Progress Notes (Signed)
Office: [phone]  /  Fax: [phone]   Initial Visit  Cynthia Little was seen in clinic today to evaluate for obesity. She is interested in losing weight to improve overall health and reduce the risk of weight related complications. She presents today to review program treatment options, initial physical assessment, and evaluation.     She was referred by: PCP  When asked what else they would like to accomplish? She states: Improve energy levels and physical activity, Improve quality of life, and Improve self-confidence  When asked how has your weight affected you? She states: Contributed to orthopedic problems or mobility issues, Having fatigue, Having poor endurance, and Problems with eating patterns  Some associated conditions: Vitamin D Deficiency  Contributing factors: Family history, Nutritional, Stress, Reduced physical activity, and Eating patterns  Weight promoting medications identified: None  Current nutrition plan: None  Current level of physical activity: None  Current or previous pharmacotherapy: Phentermine and Other: HCG  Response to medication: Lost weight and was able to maintain weight loss   Past medical history includes:   Past Medical History:  Diagnosis Date   Anemia    Asthma    Bronchitis      Objective:   BP 111/77   Pulse 84   Temp 98.2 F (36.8 C)   Ht '5\' 4"'$  (1.626 m)   SpO2 97%   BMI 44.63 kg/m  She was weighed on the bioimpedance scale: Body mass index is 44.63 kg/m.  Peak Weight:263 , Body Fat%: 46, Visceral Fat Rating: 13 out of, Weight trend over the last 12 months: Decreasing  General:  Alert, oriented and cooperative. Patient is in no acute distress.  Respiratory: Normal respiratory effort, no problems with respiration noted  Extremities: Normal range of motion.    Mental Status: Normal mood and affect. Normal behavior. Normal judgment and thought content.   DIAGNOSTIC DATA REVIEWED:  BMET    Component Value  Date/Time   NA 136 11/23/2022 1728   K 3.9 11/23/2022 1728   CL 106 11/23/2022 1728   CO2 23 11/23/2022 1728   GLUCOSE 117 (H) 11/23/2022 1728   BUN 4 (L) 11/23/2022 1728   CREATININE 0.83 11/23/2022 1728   CALCIUM 8.7 11/23/2022 1728   GFRNONAA >60 05/11/2022 1624   Lab Results  Component Value Date   HGBA1C 5.6 10/01/2022   No results found for: "INSULIN" CBC    Component Value Date/Time   WBC 8.7 11/23/2022 1728   RBC 4.79 11/23/2022 1728   HGB 12.6 11/23/2022 1728   HCT 38.0 11/23/2022 1728   PLT 242.0 11/23/2022 1728   MCV 79.2 11/23/2022 1728   MCH 27.0 05/11/2022 1624   MCHC 33.1 11/23/2022 1728   RDW 15.4 11/23/2022 1728   Iron/TIBC/Ferritin/ %Sat No results found for: "IRON", "TIBC", "FERRITIN", "IRONPCTSAT" Lipid Panel     Component Value Date/Time   CHOL 130 10/01/2022 0830   TRIG 96.0 10/01/2022 0830   HDL 31.40 (L) 10/01/2022 0830   CHOLHDL 4 10/01/2022 0830   VLDL 19.2 10/01/2022 0830   LDLCALC 80 10/01/2022 0830   Hepatic Function Panel     Component Value Date/Time   PROT 7.4 11/23/2022 1728   ALBUMIN 4.2 11/23/2022 1728   AST 13 11/23/2022 1728   ALT 15 11/23/2022 1728   ALKPHOS 44 11/23/2022 1728   BILITOT 0.3 11/23/2022 1728      Component Value Date/Time   TSH 2.62 10/01/2022 0830     Assessment and Plan:  Vitamin D  deficiency Assessment & Plan: Associated with excess adiposity and may result in leptin resistance and adipogenesis.  Most recent vitamin D levels 23.  She is currently on high-dose vitamin D without any side effects.  We will repeat vitamin D levels with intake labs for goal of 50-60.  Continue vitamin D supplementation   Class 3 severe obesity without serious comorbidity with body mass index (BMI) of 40.0 to 44.9 in adult, unspecified obesity type [facility]) Assessment & Plan: We reviewed weight, biometrics, associated medical conditions and contributing factors with patient. She would benefit from weight loss therapy via a  modified calorie, low-carb, high-protein nutritional plan tailored to their REE (resting energy expenditure) which will be determined by indirect calorimetry.  We will also assess for cardiometabolic risk and nutritional derangements via fasting serologies at her next appointment.  I reviewed recent labs and there are no significant metabolic derangements other than vitamin D deficiency.   Suspected sleep apnea Assessment & Plan: Patient reports having symptoms of loud snoring and daytime fatigue.  She will be formally screened for sleep apnea at her intake lab.  Losing 15% of body weight may improve symptoms but having untreated sleep apnea may also contribute to weight gain so she may benefit from a sleep study.          Obesity Treatment / Action Plan:  Patient will work on garnering support from family and friends to begin weight loss journey. Will work on eliminating or reducing the presence of highly palatable, calorie dense foods in the home. Will complete provided nutritional and psychosocial assessment questionnaire before the next appointment. Will be scheduled for indirect calorimetry to determine resting energy expenditure in a fasting state.  This will allow [place] to create a reduced calorie, high-protein meal plan to promote loss of fat mass while preserving muscle mass. Will think about ideas on how to incorporate physical activity into their daily routine. Counseled on the health benefits of losing 5%-15% of total body weight. Was counseled on nutritional approaches to weight loss and benefits of complex carbs and high quality protein as part of nutritional weight management. Was counseled on pharmacotherapy and role as an adjunct in weight management.   Obesity Education Performed Today:  She was weighed on the bioimpedance scale and results were discussed and documented in the synopsis.  We discussed obesity as a disease and the importance of a more detailed evaluation of  all the factors contributing to the disease.  We discussed the importance of long term lifestyle changes which include nutrition, exercise and behavioral modifications as well as the importance of customizing this to her specific health and social needs.  We discussed the benefits of reaching a healthier weight to alleviate the symptoms of existing conditions and reduce the risks of the biomechanical, metabolic and psychological effects of obesity.  Cynthia Little appears to be in the action stage of change and states they are ready to start intensive lifestyle modifications and behavioral modifications.  30 minutes was spent today on this visit including the above counseling, pre-visit chart review, and post-visit documentation.  Reviewed by clinician on day of visit: allergies, medications, problem list, medical history, surgical history, family history, social history, and previous encounter notes.    I have reviewed the above documentation for accuracy and completeness, and I agree with the above.  Thomes Dinning, MD

## 2023-01-20 ENCOUNTER — Other Ambulatory Visit: Payer: Self-pay | Admitting: Family Medicine

## 2023-01-20 ENCOUNTER — Telehealth: Payer: Self-pay | Admitting: Family Medicine

## 2023-01-20 DIAGNOSIS — B3731 Acute candidiasis of vulva and vagina: Secondary | ICD-10-CM

## 2023-01-20 MED ORDER — FLUCONAZOLE 150 MG PO TABS: ORAL_TABLET | ORAL | 0 refills | Status: DC

## 2023-01-20 NOTE — Telephone Encounter (Signed)
Patient requesting something for a yeast infection, was seen on 12/28/22 and has a yeast infection which she feels is a result of the antibiotics.

## 2023-01-20 NOTE — Telephone Encounter (Signed)
Prescription sent to pharmacy.  For continued or worsening symptoms schedule follow-up in clinic.

## 2023-01-21 NOTE — Telephone Encounter (Signed)
Pt was made aware. Verbalized understanding.

## 2023-02-03 ENCOUNTER — Ambulatory Visit (INDEPENDENT_AMBULATORY_CARE_PROVIDER_SITE_OTHER): Payer: Commercial Managed Care - PPO | Admitting: Family Medicine

## 2023-02-03 ENCOUNTER — Encounter: Payer: Self-pay | Admitting: Family Medicine

## 2023-02-03 VITALS — BP 130/70 | HR 87 | Temp 98.6°F | Ht 64.0 in | Wt 261.0 lb

## 2023-02-03 DIAGNOSIS — T3695XA Adverse effect of unspecified systemic antibiotic, initial encounter: Secondary | ICD-10-CM | POA: Diagnosis not present

## 2023-02-03 DIAGNOSIS — B379 Candidiasis, unspecified: Secondary | ICD-10-CM | POA: Diagnosis not present

## 2023-02-03 DIAGNOSIS — J020 Streptococcal pharyngitis: Secondary | ICD-10-CM | POA: Diagnosis not present

## 2023-02-03 MED ORDER — FLUCONAZOLE 150 MG PO TABS: ORAL_TABLET | ORAL | 1 refills | Status: DC

## 2023-02-03 MED ORDER — AZITHROMYCIN 250 MG PO TABS: ORAL_TABLET | ORAL | 0 refills | Status: AC

## 2023-02-03 NOTE — Progress Notes (Signed)
   Established Patient Office Visit   Subjective  Patient ID: Cynthia Little, female    DOB: 1991/09/11  Age: 32 y.o. MRN: [id number]  No chief complaint on file.   Patient is a 32 year old female seen for follow-up on ongoing concern.  Patient seen at UC 9 days ago for sore throat.  Diagnosed with strep, started on penicillin 500 mg twice daily x 10 days.  Patient states symptoms seem to improve then returned.  Endorses feeling short of breath/difficulty swallowing.     ROS Negative unless stated above    Objective:     BP 130/70   Pulse 87   Temp 98.6 F (37 C) (Oral)   Ht '5\' 4"'$  (1.626 m)   Wt 261 lb (118.4 kg)   SpO2 98%   BMI 44.80 kg/m    Physical Exam Constitutional:      General: She is not in acute distress.    Appearance: Normal appearance.  HENT:     Head: Normocephalic and atraumatic.     Nose: Nose normal.     Mouth/Throat:     Mouth: Mucous membranes are moist.     Pharynx: Oropharyngeal exudate and posterior oropharyngeal erythema present.  Eyes:     Extraocular Movements: Extraocular movements intact.     Conjunctiva/sclera: Conjunctivae normal.     Pupils: Pupils are equal, round, and reactive to light.  Cardiovascular:     Rate and Rhythm: Normal rate and regular rhythm.     Heart sounds: Normal heart sounds. No murmur heard.    No gallop.  Pulmonary:     Effort: Pulmonary effort is normal. No respiratory distress.     Breath sounds: Normal breath sounds. No wheezing, rhonchi or rales.  Skin:    General: Skin is warm and dry.  Neurological:     Mental Status: She is alert and oriented to person, place, and time.    No results found for any visits on 02/03/23.    Assessment & Plan:  Strep pharyngitis -     Azithromycin; Take 2 tablets on day 1, then 1 tablet daily on days 2 through 5  Dispense: 6 tablet; Refill: 0  Antibiotic-induced yeast infection -     Fluconazole; Take 1 tab by mouth every 3 days as needed while on  antibiotic.  Dispense: 3 tablet; Refill: 1  Failed abx therapy with PCN.  Start Azithromycin.  Diflucan prn for antibiotic induced yeast infection.  F/u prn for continued or worsened symptoms.  No follow-ups on file.   Billie Ruddy, MD

## 2023-04-09 ENCOUNTER — Encounter: Payer: Self-pay | Admitting: Family Medicine

## 2023-04-09 ENCOUNTER — Ambulatory Visit (INDEPENDENT_AMBULATORY_CARE_PROVIDER_SITE_OTHER): Payer: Commercial Managed Care - PPO | Admitting: Family Medicine

## 2023-04-09 VITALS — BP 126/78 | HR 90 | Temp 98.2°F | Ht 64.0 in | Wt 267.4 lb

## 2023-04-09 DIAGNOSIS — H5711 Ocular pain, right eye: Secondary | ICD-10-CM

## 2023-04-09 DIAGNOSIS — H00011 Hordeolum externum right upper eyelid: Secondary | ICD-10-CM | POA: Diagnosis not present

## 2023-04-09 MED ORDER — PHENTERMINE HCL 37.5 MG PO CAPS: 37.5000 mg | ORAL_CAPSULE | ORAL | 1 refills | Status: DC

## 2023-04-09 NOTE — Patient Instructions (Signed)
There was no signs of corneal abrasion on slit-lamp exam.  Given your eye pain symptoms would suggest having eye pressure checked either at local optometrist or urgent care as they may have a Tono-Pen to do this.  A referral was placed for you to see the ophthalmologist, but they likely will not be able to get you in until early next week.  Continue to worsen symptoms over the weekend would consider the other options.  Use warm compresses 4 times a day for the small stye that was noted on her upper eyelid.

## 2023-04-09 NOTE — Progress Notes (Unsigned)
   Established Patient Office Visit   Subjective  Patient ID: Cynthia Little, female    DOB: 09-22-1991  Age: 32 y.o. MRN: [id number]  Chief Complaint  Patient presents with   Eye Problem    Patient complains of right eye problem, started today     Pt is a 32 yo female seen for acute concern.  Pt with R eye pain starting this am.  Eye was red and pain fel lik it was inside of her eye.  Denies drainage, crusting, sensation of foreign body.  Tried numerous OTC eye drops.  Vision initially felt blurred.  Some photosensitivity, now improved.  Patient inquires about weight loss medications.  Went to weight management clinic however was told she really did not need to be there.  Patient has gained 11 pounds since January.      ROS Negative unless stated above    Objective:     BP 126/78 (BP Location: Left Arm, Patient Position: Sitting, Cuff Size: Normal)   Pulse 90   Temp 98.2 F (36.8 C) (Oral)   Ht  (1.626 m)   Wt 267 lb 6.4 oz (121.3 kg)   SpO2 98%   BMI 45.90 kg/m  BP Readings from Last 3 Encounters:  04/09/23 126/78  02/03/23 130/70  01/14/23 111/77   Wt Readings from Last 3 Encounters:  04/09/23 267 lb 6.4 oz (121.3 kg)  02/03/23 261 lb (118.4 kg)  01/14/23 256 lb (116.1 kg)      Physical Exam Constitutional:      Appearance: Normal appearance. She is obese.  HENT:     Head: Normocephalic and atraumatic.     Nose: Nose normal.     Mouth/Throat:     Mouth: Mucous membranes are moist.  Eyes:     General: Lids are everted, no foreign bodies appreciated. Vision grossly intact. Gaze aligned appropriately.        Right eye: Hordeolum present. No foreign body or discharge.        Left eye: No foreign body, discharge or hordeolum.     Extraocular Movements: Extraocular movements intact.     Conjunctiva/sclera: Conjunctivae normal.     Comments: R eyelid edematous.  2 mm pustule noted along lash line of right upper eyelid.  Slit-lamp exam negative.   Cardiovascular:     Rate and Rhythm: Normal rate.     Pulses: Normal pulses.  Pulmonary:     Effort: Pulmonary effort is normal.  Skin:    General: Skin is warm and dry.  Neurological:     Mental Status: She is alert and oriented to person, place, and time.      No results found for any visits on 04/09/23.    Assessment & Plan:  Acute right eye pain -     Ambulatory referral to Ophthalmology  Morbid obesity -     Phentermine HCl; Take 1 capsule (37.5 mg total) by mouth every morning.  Dispense: 30 capsule; Refill: 1  Hordeolum externum of right upper eyelid   Slit lamp exam negative.  Concern for increased IOP given h/o pain.  No discharge or signs of conjunctivitis.   Ophthalmology referral placed, however advised to proceed to UC or local optomitrist to evaluate as no tono pen in clinic on a late Friday afternoon.  Warm compresses 4 times daily for hordeolum.  Return if symptoms worsen or fail to improve.   Deeann Saint, MD

## 2023-07-15 ENCOUNTER — Encounter: Payer: Self-pay | Admitting: Family Medicine

## 2023-07-15 ENCOUNTER — Telehealth (INDEPENDENT_AMBULATORY_CARE_PROVIDER_SITE_OTHER): Payer: Commercial Managed Care - PPO | Admitting: Family Medicine

## 2023-07-15 DIAGNOSIS — U071 COVID-19: Secondary | ICD-10-CM | POA: Diagnosis not present

## 2023-07-15 MED ORDER — MOLNUPIRAVIR EUA 200MG CAPSULE: 4.0000 | ORAL_CAPSULE | Freq: Two times a day (BID) | ORAL | 0 refills | Status: AC

## 2023-07-15 MED ORDER — PHENTERMINE HCL 37.5 MG PO CAPS: 37.5000 mg | ORAL_CAPSULE | ORAL | 2 refills | Status: DC

## 2023-07-15 NOTE — Progress Notes (Signed)
Virtual Visit via Video Note  I connected with Jermaine Tholl on 07/15/23 at  1:30 PM EDT by a video enabled telemedicine application and verified that I am speaking with the correct person using two identifiers.  Location patient: home Location provider:work or home office Persons participating in the virtual visit: patient, provider  I discussed the limitations of evaluation and management by telemedicine and the availability of in person appointments. The patient expressed understanding and agreed to proceed.  Chief Complaint  Patient presents with   Covid Positive    Sinus pressure and sore throat on yesterday, then got weird feeling in nose like when she had covid in 202 . HA, eyes swollen, throat swollen, tested negative on previous days but today tested positive. Was exposed at work last week. Last night took alkaseltzer,nothing else    HPI: Pt is a 32 yo female seen for acute concern.  Pt states someone at work had COVID.  Pt took a test on Sunday which was negative.  She developed sinus pressure, ST, and productive cough yesterday 7/24.  Now also has congestion, feels stuffy, and chills.  Had increased stools earlier this wk.  Denies n/v, ear pain/ear pressure.  Patient mentions phentermine seem to help with weight loss and appetite.  Patient has been out of medication for a few months and inquires about refills.  ROS: See pertinent positives and negatives per HPI.  Past Medical History:  Diagnosis Date   Anemia    Asthma    Bronchitis     Past Surgical History:  Procedure Laterality Date   CALCANEAL OSTEOTOMY Left 05/17/2019   Procedure: CALCANEAL OSTECTOMY;  Surgeon: Park Liter, DPM;  Location: [facility];  Service: Podiatry;  Laterality: Left;   CESAREAN SECTION  08/11/2012   Procedure: CESAREAN SECTION;  Surgeon: Bing Plume, MD;  Location: [facility];  Service: Gynecology;  Laterality: N/A;   CHEILECTOMY Left 05/17/2019   Procedure: CHEILECTOMY  LEFT FOOT;  Surgeon: Park Liter, DPM;  Location: [facility];  Service: Podiatry;  Laterality: Left;   GASTROC RECESSION EXTREMITY Left 05/17/2019   Procedure: GASTROCNEMIUS RECESS;  Surgeon: Park Liter, DPM;  Location: [facility];  Service: Podiatry;  Laterality: Left;   HAMMER TOE SURGERY Left 05/17/2019   Procedure: HAMMER TOE CORRECTION FIFTH;  Surgeon: Park Liter, DPM;  Location: [facility];  Service: Podiatry;  Laterality: Left;   MINOR CLOSED MANIPULATION Left 05/17/2019   Procedure: MINOR CLOSED MANIPULATION;  Surgeon: Park Liter, DPM;  Location: [facility];  Service: Podiatry;  Laterality: Left;   NO PAST SURGERIES      Family History  Problem Relation Age of Onset   Diabetes Father     Current Outpatient Medications:    albuterol (VENTOLIN HFA) 108 (90 Base) MCG/ACT inhaler, Inhale 2 puffs into the lungs every 4 (four) hours as needed., Disp: 18 g, Rfl: 1   cetirizine (ZYRTEC) 10 MG tablet, TAKE 1 TABLET BY MOUTH EVERY DAY, Disp: 90 tablet, Rfl: 1   Cholecalciferol (D3 ADULT PO), Take by mouth., Disp: , Rfl:    Collagen 500 MG CAPS, Take by mouth., Disp: , Rfl:    CRANBERRY PO, Take by mouth., Disp: , Rfl:    [employer] EVENING PRIMROSE OIL PO, Take by mouth., Disp: , Rfl:    hydroquinone 4 % cream, , Disp: , Rfl:    ibuprofen (ADVIL) 600 MG tablet, ibuprofen 600 mg tablet, Disp: ,  Rfl:    Multiple Vitamin (MULTIVITAMIN) capsule, Take 1 capsule by mouth daily., Disp: , Rfl:    phentermine 37.5 MG capsule, Take 1 capsule (37.5 mg total) by mouth every morning., Disp: 30 capsule, Rfl: 1   tretinoin (RETIN-A) 0.025 % cream, , Disp: , Rfl:    Vitamin D, Ergocalciferol, (DRISDOL) 1.25 MG (50000 UNIT) CAPS capsule, Take 1 capsule (50,000 Units total) by mouth every 7 (seven) days., Disp: 12 capsule, Rfl: 0   B Complex-C-Folic Acid (B COMPLEX-VITAMIN C-FOLIC ACID) 1 MG tablet, Take by mouth. (Patient not taking:  Reported on 07/15/2023), Disp: , Rfl:    Specialty Vitamins Products (BIOTIN PLUS KERATIN) 10000-100 MCG-MG TABS, Take by mouth., Disp: , Rfl:   EXAM:  VITALS per patient if applicable: RR 12-20 bpm  GENERAL: alert, oriented, appears well and in no acute distress  HEENT: atraumatic, conjunctiva clear, no obvious abnormalities on inspection of external nose and ears  NECK: normal movements of the head and neck  LUNGS: on inspection no signs of respiratory distress, breathing rate appears normal, no obvious gross SOB, gasping or wheezing  CV: no obvious cyanosis  MS: moves all visible extremities without noticeable abnormality  PSYCH/NEURO: pleasant and cooperative, no obvious depression or anxiety, speech and thought processing grossly intact  ASSESSMENT AND PLAN:  Discussed the following assessment and plan:  COVID-19 virus infection - Plan: molnupiravir EUA (LAGEVRIO) 200 mg CAPS capsule  Morbid obesity (HCC) - Plan: phentermine 37.5 MG capsule  URI symptoms started 07/14/23 with positive COVID-19 test 07/15/23.  Discussed r/b/a of anitviral medication.  Patient wishes to start.  Prescription for molnupiravir sent to pharmacy.  Continue supportive care with OTC cough/cold medications.  Given strict precautions.  Phentermine refilled.  Patient to wait on restarting medication until after resolution of current COVID symptoms.  Pt to schedule CPE in October 2024.  Follow-up as needed.   I discussed the assessment and treatment plan with the patient. The patient was provided an opportunity to ask questions and all were answered. The patient agreed with the plan and demonstrated an understanding of the instructions.   The patient was advised to call back or seek an in-person evaluation if the symptoms worsen or if the condition fails to improve as anticipated.   Deeann Saint, MD

## 2023-10-11 ENCOUNTER — Ambulatory Visit (INDEPENDENT_AMBULATORY_CARE_PROVIDER_SITE_OTHER): Payer: No Typology Code available for payment source | Admitting: Family Medicine

## 2023-10-11 ENCOUNTER — Encounter: Payer: Self-pay | Admitting: Family Medicine

## 2023-10-11 VITALS — BP 102/70 | HR 69 | Temp 98.9°F | Ht 64.25 in | Wt 252.0 lb

## 2023-10-11 DIAGNOSIS — E559 Vitamin D deficiency, unspecified: Secondary | ICD-10-CM

## 2023-10-11 DIAGNOSIS — N907 Vulvar cyst: Secondary | ICD-10-CM

## 2023-10-11 DIAGNOSIS — E66813 Obesity, class 3: Secondary | ICD-10-CM | POA: Diagnosis not present

## 2023-10-11 DIAGNOSIS — Z6841 Body Mass Index (BMI) 40.0 and over, adult: Secondary | ICD-10-CM

## 2023-10-11 DIAGNOSIS — Z0000 Encounter for general adult medical examination without abnormal findings: Secondary | ICD-10-CM

## 2023-10-11 LAB — LIPID PANEL
Cholesterol: 138 mg/dL (ref 0–200)
HDL: 33.6 mg/dL — ABNORMAL LOW (ref >39.00)
LDL Cholesterol: 84 mg/dL (ref 0–99)
NonHDL: 103.98
Total CHOL/HDL Ratio: 4
Triglycerides: 99 mg/dL (ref 0.0–149.0)
VLDL: 19.8 mg/dL (ref 0.0–40.0)

## 2023-10-11 LAB — COMPREHENSIVE METABOLIC PANEL
ALT: 17 U/L (ref 0–35)
AST: 16 U/L (ref 0–37)
Albumin: 4.5 g/dL (ref 3.5–5.2)
Alkaline Phosphatase: 51 U/L (ref 39–117)
BUN: 9 mg/dL (ref 6–23)
CO2: 22 meq/L (ref 19–32)
Calcium: 9.3 mg/dL (ref 8.4–10.5)
Chloride: 107 meq/L (ref 96–112)
Creatinine, Ser: 0.89 mg/dL (ref 0.40–1.20)
GFR: 84.4 mL/min (ref >60.00)
Glucose, Bld: 97 mg/dL (ref 70–99)
Potassium: 4 meq/L (ref 3.5–5.1)
Sodium: 136 meq/L (ref 135–145)
Total Bilirubin: 0.4 mg/dL (ref 0.2–1.2)
Total Protein: 7.5 g/dL (ref 6.0–8.3)

## 2023-10-11 LAB — TSH: TSH: 2.38 u[IU]/mL (ref 0.35–5.50)

## 2023-10-11 LAB — CBC WITH DIFFERENTIAL/PLATELET
Basophils Absolute: 0.1 10*3/uL (ref 0.0–0.1)
Basophils Relative: 0.8 % (ref 0.0–3.0)
Eosinophils Absolute: 0.1 10*3/uL (ref 0.0–0.7)
Eosinophils Relative: 1.6 % (ref 0.0–5.0)
HCT: 38.1 % (ref 36.0–46.0)
Hemoglobin: 12.2 g/dL (ref 12.0–15.0)
Lymphocytes Relative: 33.1 % (ref 12.0–46.0)
Lymphs Abs: 2.9 10*3/uL (ref 0.7–4.0)
MCHC: 32 g/dL (ref 30.0–36.0)
MCV: 80.6 fL (ref 78.0–100.0)
Monocytes Absolute: 0.4 10*3/uL (ref 0.1–1.0)
Monocytes Relative: 4.3 % (ref 3.0–12.0)
Neutro Abs: 5.2 10*3/uL (ref 1.4–7.7)
Neutrophils Relative %: 60.2 % (ref 43.0–77.0)
Platelets: 270 10*3/uL (ref 150.0–400.0)
RBC: 4.73 Mil/uL (ref 3.87–5.11)
RDW: 15.4 % (ref 11.5–15.5)
WBC: 8.6 10*3/uL (ref 4.0–10.5)

## 2023-10-11 LAB — T4, FREE: Free T4: 0.5 ng/dL — ABNORMAL LOW (ref 0.60–1.60)

## 2023-10-11 LAB — VITAMIN D 25 HYDROXY (VIT D DEFICIENCY, FRACTURES): VITD: 37.15 ng/mL (ref 30.00–100.00)

## 2023-10-11 NOTE — Progress Notes (Signed)
Established Patient Office Visit   Subjective  Patient ID: Cynthia Little, female    DOB: 1991/08/03  Age: 32 y.o. MRN: [id number]  Chief Complaint  Patient presents with   Annual Exam    Pt is a 32 yo female seen for CPE.  Pt had coffee with creamer in it this am.  States she is doing well despite some increased stress at work. In counseling.  Has a small painful bump in groin that comes and goes.  Area does not drain. Waxing hair.   Pt losing wt on Qysmia.  Can tell clothes are fitting better.  Doing intermittent fasting and decreasing portions as med helps decrease appetite.  Previously tried phentermine without issue.  Pt interested in Vit D level.  Vit D supplement was increased by OB/Gyn and PNVs started due to levels.  Pt has noticed more energy when taking the Vit D.    Patient Active Problem List   Diagnosis Date Noted   Suspected sleep apnea 01/14/2023   Class 3 severe obesity without serious comorbidity with body mass index (BMI) of 40.0 to 44.9 in adult [facility]) 12/17/2020   Vitamin D deficiency 12/17/2020   Tenderness 06/21/2020   Past Medical History:  Diagnosis Date   Anemia    Asthma    Bronchitis    Past Surgical History:  Procedure Laterality Date   CALCANEAL OSTEOTOMY Left 05/17/2019   Procedure: CALCANEAL OSTECTOMY;  Surgeon: Park Liter, DPM;  Location: [facility];  Service: Podiatry;  Laterality: Left;   CESAREAN SECTION  08/11/2012   Procedure: CESAREAN SECTION;  Surgeon: Bing Plume, MD;  Location: [facility];  Service: Gynecology;  Laterality: N/A;   CHEILECTOMY Left 05/17/2019   Procedure: CHEILECTOMY LEFT FOOT;  Surgeon: Park Liter, DPM;  Location: [facility];  Service: Podiatry;  Laterality: Left;   GASTROC RECESSION EXTREMITY Left 05/17/2019   Procedure: GASTROCNEMIUS RECESS;  Surgeon: Park Liter, DPM;  Location: [facility];  Service: Podiatry;  Laterality: Left;   HAMMER TOE SURGERY Left  05/17/2019   Procedure: HAMMER TOE CORRECTION FIFTH;  Surgeon: Park Liter, DPM;  Location: [facility];  Service: Podiatry;  Laterality: Left;   MINOR CLOSED MANIPULATION Left 05/17/2019   Procedure: MINOR CLOSED MANIPULATION;  Surgeon: Park Liter, DPM;  Location: [facility];  Service: Podiatry;  Laterality: Left;   NO PAST SURGERIES     Social History   Tobacco Use   Smoking status: Never   Smokeless tobacco: Never  Vaping Use   Vaping status: Never Used  Substance Use Topics   Alcohol use: No    Comment: rare   Drug use: No   Family History  Problem Relation Age of Onset   Diabetes Father    Allergies  Allergen Reactions   Cephalexin Hives and Other (See Comments)    Hyperventilate       ROS Negative unless stated above    Objective:     BP 102/70   Pulse 69   Temp 98.9 F (37.2 C) (Oral)   Ht 5' 4.25" (1.632 m)   Wt 252 lb (114.3 kg)   SpO2 96%   BMI 42.92 kg/m  BP Readings from Last 3 Encounters:  10/11/23 102/70  04/09/23 126/78  02/03/23 130/70   Wt Readings from Last 3 Encounters:  10/11/23 252 lb (114.3 kg)  04/09/23 267 lb 6.4 oz (121.3 kg)  02/03/23 261 lb (118.4 kg)  Physical Exam Constitutional:      Appearance: Normal appearance.  HENT:     Head: Normocephalic and atraumatic.     Right Ear: Tympanic membrane, ear canal and external ear normal.     Left Ear: Tympanic membrane, ear canal and external ear normal.     Nose: Nose normal.     Mouth/Throat:     Mouth: Mucous membranes are moist.     Pharynx: No oropharyngeal exudate or posterior oropharyngeal erythema.  Eyes:     General: No scleral icterus.    Extraocular Movements: Extraocular movements intact.     Conjunctiva/sclera: Conjunctivae normal.     Pupils: Pupils are equal, round, and reactive to light.  Neck:     Thyroid: No thyromegaly.  Cardiovascular:     Rate and Rhythm: Normal rate and regular rhythm.     Pulses: Normal  pulses.     Heart sounds: Normal heart sounds. No murmur heard.    No friction rub.  Pulmonary:     Effort: Pulmonary effort is normal.     Breath sounds: Normal breath sounds. No wheezing, rhonchi or rales.  Abdominal:     General: Bowel sounds are normal.     Palpations: Abdomen is soft.     Tenderness: There is no abdominal tenderness.  Genitourinary:    Comments: A 7 mm round mobile, firm, cystic structure within L lateral labia majora.  Mild TTP.  No erythema, drainage, or induration. No Inguinal lymphadenopathy. Musculoskeletal:        General: No deformity. Normal range of motion.  Lymphadenopathy:     Cervical: No cervical adenopathy.  Skin:    General: Skin is warm and dry.     Findings: No lesion.     Comments: L  Neurological:     General: No focal deficit present.     Mental Status: She is alert and oriented to person, place, and time.  Psychiatric:        Mood and Affect: Mood normal.        Thought Content: Thought content normal.       04/09/2023    4:51 PM 12/07/2022    4:03 PM 10/01/2022    8:19 AM  Depression screen PHQ 2/9  Decreased Interest 0 0 0  Down, Depressed, Hopeless 0 0 0  PHQ - 2 Score 0 0 0  Altered sleeping  2 2  Tired, decreased energy  3 1  Change in appetite  2 0  Feeling bad or failure about yourself   0 0  Trouble concentrating  0 0  Moving slowly or fidgety/restless  0 0  Suicidal thoughts  0 0  PHQ-9 Score  7 3  Difficult doing work/chores  Somewhat difficult Somewhat difficult    No results found for any visits on 10/11/23.    Assessment & Plan:  Well adult exam -Age-appropriate health screenings discussed -Pap done 07/28/2022 with OB/GYN. Repeat in 5 yrs, 2028 -colonoscopy and mammogram not yet indicated due to age -immunizations reviewed -next CPE in 1 yr -     CBC with Differential/Platelet -     Comprehensive metabolic panel -     TSH -     T4, free -     Lipid panel  Vitamin D deficiency -     VITAMIN D 25  Hydroxy (Vit-D Deficiency, Fractures)  Class 3 severe obesity without serious comorbidity with body mass index (BMI) of 40.0 to 44.9 in adult, unspecified obesity type (HCC) -Body  mass index is 42.92 kg/m. -Patient congratulated on 15 pound weight loss.  Previously 267 LBS on 04/09/23.  Current weight 252 LBS. -Continue lifestyle modifications and Qsymia -     Iron, TIBC and Ferritin Panel -     CBC with Differential/Platelet -     Comprehensive metabolic panel -     TSH -     T4, free -     Lipid panel  Epidermoid cyst of labia majora -Likely epidermoid cyst. -Discussed supportive care including warm compresses, sit baths, Hibiclens. -Discussed I&D for continued or worsening symptoms.  Patient to notify clinic if desired. -Given precautions  Return if symptoms worsen or fail to improve.   Deeann Saint, MD

## 2023-10-12 LAB — IRON,TIBC AND FERRITIN PANEL
%SAT: 17 % (ref 16–45)
Ferritin: 40 ng/mL (ref 16–154)
Iron: 57 ug/dL (ref 40–190)
TIBC: 333 ug/dL (ref 250–450)

## 2023-10-18 ENCOUNTER — Ambulatory Visit: Payer: No Typology Code available for payment source | Admitting: Podiatry

## 2023-10-19 ENCOUNTER — Encounter: Payer: Self-pay | Admitting: Neurology

## 2023-10-19 ENCOUNTER — Ambulatory Visit (INDEPENDENT_AMBULATORY_CARE_PROVIDER_SITE_OTHER): Payer: No Typology Code available for payment source | Admitting: Neurology

## 2023-10-19 VITALS — BP 112/73 | HR 72 | Ht 64.0 in | Wt 248.0 lb

## 2023-10-19 DIAGNOSIS — E66813 Obesity, class 3: Secondary | ICD-10-CM | POA: Diagnosis not present

## 2023-10-19 DIAGNOSIS — J680 Bronchitis and pneumonitis due to chemicals, gases, fumes and vapors: Secondary | ICD-10-CM | POA: Insufficient documentation

## 2023-10-19 DIAGNOSIS — R29818 Other symptoms and signs involving the nervous system: Secondary | ICD-10-CM

## 2023-10-19 DIAGNOSIS — E661 Drug-induced obesity: Secondary | ICD-10-CM

## 2023-10-19 DIAGNOSIS — J329 Chronic sinusitis, unspecified: Secondary | ICD-10-CM | POA: Diagnosis not present

## 2023-10-19 DIAGNOSIS — Z6841 Body Mass Index (BMI) 40.0 and over, adult: Secondary | ICD-10-CM

## 2023-10-19 DIAGNOSIS — R0683 Snoring: Secondary | ICD-10-CM | POA: Diagnosis not present

## 2023-10-19 NOTE — Progress Notes (Signed)
SLEEP MEDICINE CLINIC    Provider:  Melvyn Novas, MD  Primary Care Physician:  Deeann Saint, MD [address] [place] [address]     Referring Provider: Ned Clines, Np [address] [place],  [place] [address]          Chief Complaint according to patient   Patient presents with:     New SLEEP Patient (Initial Visit) [facility].            HISTORY OF PRESENT ILLNESS:  Cynthia Little is a 32 y.o. AA female patient who is seen upon [place] provider referral on 10/19/2023 for a sleep consultation..  Chief concern according to patient :  " I am snoring and suspected to have OSA, had several years ago 04-10-2020 a PSG at the [facility] which was inconclusive" .    I have the pleasure of seeing Cynthia Little 10/19/23 a right handed -handed female with a possible sleep disorder.     Relevant medical history:  had 2021 left foot surgery, 2020 Achilles tendon surgery- no Nocturia, vivid dreams - some anxiety related ,  no ENT surgery.  Sinusitis frequent. Causing headaches, non- seasonal. Braces in place. Nasal congestion.      Family medical /sleep history: No other family member on CPAP with OSA, insomnia, sleep walkers. Son with asthma.    Social history:  Patient is working as Counselling psychologist,  and lives in a household with her son , 18  years-old,  no pets.  She was 8 years working for the [employer] and was in [place] for one year.  The patient currently works/ used to work in shifts( night/ rotating,) Tobacco use: none .   ETOH use - 2/ month,  Caffeine intake in form of Coffee( 1 cup in AM) Soda( /) Tea ( /) and no  energy drinks No regular Exercise, yard work.       Sleep habits are as follows: The patient's dinner time is between 5-7 PM.  The bedroom is cool, quiet ,dark.  The patient goes to bed at 10-11 PM and continues to sleep for 6 hours, no  bathroom breaks, may wake up after vivid dreams the first time  at 2-4 AM.   The preferred sleep position is laterally , with the support of 2 pillows. Dreams are reportedly frequent/vivid.   The patient wakes up spontaneously before the  alarm, set for 5.45 . 6.15  AM is the usual rise time. Her son gets up at 6. 45.   She reports not feeling refreshed or restored in AM, with symptoms such as nasal congestion, then having dry mouth, morning headaches, and residual fatigue.   Naps are taken infrequently, lasting from 15 to 30 minutes and are more boost-like , not interfering with nocturnal sleep.    Review of Systems: Out of a complete 14 system review, the patient complains of only the following symptoms, and all other reviewed systems are negative.:  Fatigue, sleepiness , snoring.  How likely are you to doze in the following situations: 0 = not likely, 1 = slight chance, 2 = moderate chance, 3 = high chance   Sitting and Reading? Watching Television? Sitting inactive in a public place (theater or meeting)? As a passenger in a car for an hour without a break? Lying down in the afternoon when circumstances permit? Sitting and talking to someone? Sitting quietly after lunch without alcohol? In a car, while stopped  for a few minutes in traffic?   Total = 14/ 24 points   FSS endorsed at 23/ 63 points.   Social History   Socioeconomic History   Marital status: Single    Spouse name: Not on file   Number of children: None son    Years of education: High school and college    Highest education level: Some college, no degree  Occupational History   Not on file                                 social security service administrant   Tobacco Use   Smoking status: Never   Smokeless tobacco: Never  Vaping Use   Vaping status: Never Used  Substance and Sexual Activity   Alcohol use: No    Comment: rare   Drug use: No   Sexual activity: Yes  Other Topics Concern   Not on file  Social History Narrative   Not on file   Social Determinants of  Health   Financial Resource Strain: Low Risk  (10/10/2023)   Overall Financial Resource Strain (CARDIA)    Difficulty of Paying Living Expenses: Not hard at all  Food Insecurity: No Food Insecurity (10/10/2023)   Hunger Vital Sign    Worried About Running Out of Food in the Last Year: Never true    Ran Out of Food in the Last Year: Never true  Transportation Needs: No Transportation Needs (10/10/2023)   PRAPARE - Administrator, Civil Service (Medical): No    Lack of Transportation (Non-Medical): No  Physical Activity: Unknown (10/10/2023)   Exercise Vital Sign    Days of Exercise per Week: 0 days    Minutes of Exercise per Session: Not on file  Stress: Stress Concern Present (10/10/2023)   [employer] of Occupational Health - Occupational Stress Questionnaire    Feeling of Stress : Very much  Social Connections: Moderately Isolated (10/10/2023)   Social Connection and Isolation Panel [NHANES]    Frequency of Communication with Friends and Family: More than three times a week    Frequency of Social Gatherings with Friends and Family: More than three times a week    Attends Religious Services: 1 to 4 times per year    Active Member of [employer] or Organizations: No    Attends Engineer, structural: Not on file    Marital Status: Never married    Family History  Problem Relation Age of Onset   Diabetes Father     Past Medical History:  Diagnosis Date   Anemia    Asthma    Bronchitis     Past Surgical History:  Procedure Laterality Date   CALCANEAL OSTEOTOMY Left 05/17/2019   Procedure: CALCANEAL OSTECTOMY;  Surgeon: Park Liter, DPM;  Location: [facility];  Service: Podiatry;  Laterality: Left;   CESAREAN SECTION  08/11/2012   Procedure: CESAREAN SECTION;  Surgeon: Bing Plume, MD;  Location: [facility];  Service: Gynecology;  Laterality: N/A;   CHEILECTOMY Left 05/17/2019   Procedure: CHEILECTOMY LEFT FOOT;  Surgeon: Park Liter, DPM;  Location: [facility];  Service: Podiatry;  Laterality: Left;   GASTROC RECESSION EXTREMITY Left 05/17/2019   Procedure: GASTROCNEMIUS RECESS;  Surgeon: Park Liter, DPM;  Location: [facility];  Service: Podiatry;  Laterality: Left;   HAMMER TOE SURGERY Left 05/17/2019   Procedure: HAMMER  TOE CORRECTION FIFTH;  Surgeon: Park Liter, DPM;  Location: [facility];  Service: Podiatry;  Laterality: Left;   MINOR CLOSED MANIPULATION Left 05/17/2019   Procedure: MINOR CLOSED MANIPULATION;  Surgeon: Park Liter, DPM;  Location: [facility];  Service: Podiatry;  Laterality: Left;   NO PAST SURGERIES       Current Outpatient Medications on File Prior to Visit  Medication Sig Dispense Refill   albuterol (VENTOLIN HFA) 108 (90 Base) MCG/ACT inhaler Inhale 2 puffs into the lungs every 4 (four) hours as needed. 18 g 1   B Complex-C-Folic Acid (B COMPLEX-VITAMIN C-FOLIC ACID) 1 MG tablet Take by mouth.     cetirizine (ZYRTEC) 10 MG tablet TAKE 1 TABLET BY MOUTH EVERY DAY 90 tablet 1   Cholecalciferol (D3 PO) Take by mouth.     Collagen 500 MG CAPS Take by mouth.     CRANBERRY PO Take by mouth.     [facility] EVENING PRIMROSE OIL PO Take by mouth.     hydroquinone 4 % cream      ibuprofen (ADVIL) 600 MG tablet ibuprofen 600 mg tablet     Multiple Vitamins-Minerals (ZINC PO) Take by mouth.     omeprazole (PRILOSEC) 20 MG capsule Take 20 mg by mouth daily.     Phentermine-Topiramate (QSYMIA) 7.5-46 MG CP24 Take by mouth.     Prenat-Methylfol-Chol-Fish Oil (PRENATAL + COMPLETE MULTI PO) Take by mouth. For low vit D and iron not pregnancy     sertraline (ZOLOFT) 100 MG tablet Take by mouth.     tretinoin (RETIN-A) 0.025 % cream      Vitamin D, Ergocalciferol, (DRISDOL) 1.25 MG (50000 UNIT) CAPS capsule Take 1 capsule (50,000 Units total) by mouth every 7 (seven) days. 12 capsule 0   [DISCONTINUED] enoxaparin (LOVENOX) 40 MG/0.4ML  injection Inject 0.4 mLs (40 mg total) into the skin every 12 (twelve) hours. 28 Syringe 0   No current facility-administered medications on file prior to visit.    Allergies  Allergen Reactions   Cephalexin Hives and Other (See Comments)    Hyperventilate      DIAGNOSTIC DATA (LABS, IMAGING, TESTING) - I reviewed patient records, labs, notes, testing and imaging myself where available.  Lab Results  Component Value Date   WBC 8.6 10/11/2023   HGB 12.2 10/11/2023   HCT 38.1 10/11/2023   MCV 80.6 10/11/2023   PLT 270.0 10/11/2023      Component Value Date/Time   NA 136 10/11/2023 1136   K 4.0 10/11/2023 1136   CL 107 10/11/2023 1136   CO2 22 10/11/2023 1136   GLUCOSE 97 10/11/2023 1136   BUN 9 10/11/2023 1136   CREATININE 0.89 10/11/2023 1136   CALCIUM 9.3 10/11/2023 1136   PROT 7.5 10/11/2023 1136   ALBUMIN 4.5 10/11/2023 1136   AST 16 10/11/2023 1136   ALT 17 10/11/2023 1136   ALKPHOS 51 10/11/2023 1136   BILITOT 0.4 10/11/2023 1136   GFRNONAA >60 05/11/2022 1624   Lab Results  Component Value Date   CHOL 138 10/11/2023   HDL 33.60 (L) 10/11/2023   LDLCALC 84 10/11/2023   TRIG 99.0 10/11/2023   CHOLHDL 4 10/11/2023   Lab Results  Component Value Date   HGBA1C 5.6 10/01/2022   No results found for: "VITAMINB12" Lab Results  Component Value Date   TSH 2.38 10/11/2023    PHYSICAL EXAM:  Today's Vitals   10/19/23 1248  BP: 112/73  Pulse: 72  Weight: 248 lb (112.5 kg)  Height: 5\' 4"  (1.626 m)   Body mass index is 42.57 kg/m.   Wt Readings from Last 3 Encounters:  10/19/23 248 lb (112.5 kg)  10/11/23 252 lb (114.3 kg)  04/09/23 267 lb 6.4 oz (121.3 kg)     Ht Readings from Last 3 Encounters:  10/19/23 5\' 4"  (1.626 m)  10/11/23 5' 4.25" (1.632 m)  04/09/23 5\' 4"  (1.626 m)      General: The patient is awake, alert and appears not in acute distress. The patient is well groomed. Head: Normocephalic, atraumatic. Neck is supple.  Mallampati  1-2,  neck circumference:17 inches . Nasal airflow is not fully patent.  Retrognathia is not seen.  Wears braces, had an overbite Dental status: biological  Cardiovascular:  Regular rate and cardiac rhythm by pulse,  without distended neck veins. Respiratory: Lungs are clear to auscultation.  Skin:  Without evidence of ankle edema, or rash. Trunk: The patient's posture is erect.   NEUROLOGIC EXAM: The patient is awake and alert, oriented to place and time.   Memory subjective described as intact.  Attention span & concentration ability appears normal.  Speech is fluent,  without dysarthria, dysphonia or aphasia.  Mood and affect are appropriate.   Cranial nerves: no loss of smell or taste reported  Pupils are equal and briskly reactive to light. Funduscopic exam deferred.  Extraocular movements in vertical and horizontal planes were intact and without nystagmus. No Diplopia. Visual fields by finger perimetry are intact. Hearing was intact to soft voice and finger rubbing.   Facial sensation intact to fine touch. Facial motor strength is symmetric and tongue and uvula move midline.  Neck ROM : rotation, tilt and flexion extension were normal for age and shoulder shrug was symmetrical.    Motor exam:  Symmetric bulk, tone and ROM.   Normal tone without cog- wheeling, symmetric grip strength.   Sensory:  Fine touch and vibration were  normal.  Proprioception tested in the upper extremities was normal.   Coordination: Rapid alternating movements in the fingers/hands were of normal speed.  The Finger-to-nose maneuver was intact without evidence of ataxia, dysmetria or tremor. Gait and station: Patient could rise unassisted from a seated position, walked without assistive device.  Stance is of normal width/ base and the patient turned with 3 steps.  Toe and heel walk were deferred.  Deep tendon reflexes: in the  upper and lower extremities are symmetric and intact.  Patella was  attenuated.  Babinski response was deferred .    ASSESSMENT AND PLAN 32 year-old female [facility] patient  here with:    1) Loud snoring.  Functional macroglossia,  wearing braces. In the setting of obesity , lost 15 pounds since April.  Goal is 220.    2) Snoring-  frequently having congestion and sinusitis,  and during these spells she is a mouth breather, wakes with morning headaches and dry mouth,  It was during her service in [place] , that she developed  sinus irritation, bronchitis, exposure to burn -pits.   3) night sweats - feeling drenched, not associated with palpitations. No menstrual irregularity.     I will order a HST.  We will follow up on results after 3-5 months,  we can initiate treatment after the sleep test is interpreted (as necessary).      I plan to follow up either personally or through our NP within 3-5 months.   I would like to thank Deeann Saint,  MD and Ned Clines, Np [address] [place],  [place] [address] for allowing me to meet with and to take care of this pleasant patient.    After spending a total time of  45  minutes face to face and additional time for physical and neurologic examination, review of laboratory studies,  personal review of imaging studies, reports and results of other testing and review of referral information / records as far as provided in visit,   Electronically signed by: Melvyn Novas, MD 10/19/2023 1:03 PM  [facility] Board certified by The [employer] of Sleep Medicine and Diplomate of the [employer] of Sleep Medicine. Board certified In Neurology through the ABPN, Fellow of the [employer] of Neurology.

## 2023-10-19 NOTE — Patient Instructions (Signed)
Quality Sleep Information, Adult Quality sleep is important for your mental and physical health. It also improves your quality of life. Quality sleep means you: Are asleep for most of the time you are in bed. Fall asleep within 30 minutes. Wake up no more than once a night. Are awake for no longer than 20 minutes if you do wake up during the night. Most adults need 7-8 hours of quality sleep each night. How can poor sleep affect me? If you do not get enough quality sleep, you may have: Mood swings. Daytime sleepiness. Decreased alertness, reaction time, and concentration. Sleep disorders, such as insomnia and sleep apnea. Difficulty with: Solving problems. Coping with stress. Paying attention. These issues may affect your performance and productivity at work, school, and home. Lack of sleep may also put you at higher risk for accidents, suicide, and risky behaviors. If you do not get quality sleep, you may also be at higher risk for several health problems, including: Infections. Type 2 diabetes. Heart disease. High blood pressure. Obesity. Worsening of long-term conditions, like arthritis, kidney disease, depression, Parkinson's disease, and epilepsy. What actions can I take to get more quality sleep? Sleep schedule and routine Stick to a sleep schedule. Go to sleep and wake up at about the same time each day. Do not try to sleep less on weekdays and make up for lost sleep on weekends. This does not work. Limit naps during the day to 30 minutes or less. Do not take naps in the late afternoon. Make time to relax before bed. Reading, listening to music, or taking a hot bath promotes quality sleep. Make your bedroom a place that promotes quality sleep. Keep your bedroom dark, quiet, and at a comfortable room temperature. Make sure your bed is comfortable. Avoid using electronic devices that give off bright blue light for 30 minutes before bedtime. Your brain perceives bright blue light  as sunlight. This includes television, phones, and computers. If you are lying awake in bed for longer than 20 minutes, get up and do a relaxing activity until you feel sleepy. Lifestyle     Try to get at least 30 minutes of exercise on most days. Do not exercise 2-3 hours before going to bed. Do not use any products that contain nicotine or tobacco. These products include cigarettes, chewing tobacco, and vaping devices, such as e-cigarettes. If you need help quitting, ask your health care provider. Do not drink caffeinated beverages for at least 8 hours before going to bed. Coffee, tea, and some sodas contain caffeine. Do not drink alcohol or eat large meals close to bedtime. Try to get at least 30 minutes of sunlight every day. Morning sunlight is best. Medical concerns Work with your health care provider to treat medical conditions that may affect sleeping, such as: Nasal obstruction. Snoring. Sleep apnea and other sleep disorders. Talk to your health care provider if you think any of your prescription medicines may cause you to have difficulty falling or staying asleep. If you have sleep problems, talk with a sleep consultant. If you think you have a sleep disorder, talk with your health care provider about getting evaluated by a specialist. Where to find more information Sleep Foundation: sleepfoundation.org American Academy of Sleep Medicine: aasm.org Centers for Disease Control and Prevention (CDC): cdc.gov Contact a health care provider if: You have trouble getting to sleep or staying asleep. You often wake up very early in the morning and cannot get back to sleep. You have daytime sleepiness. You   have daytime sleep attacks of suddenly falling asleep and sudden muscle weakness (narcolepsy). You have a tingling sensation in your legs with a strong urge to move your legs (restless legs syndrome). You stop breathing briefly during sleep (sleep apnea). You think you have a sleep  disorder or are taking a medicine that is affecting your quality of sleep. Summary Most adults need 7-8 hours of quality sleep each night. Getting enough quality sleep is important for your mental and physical health. Make your bedroom a place that promotes quality sleep, and avoid things that may cause you to have poor sleep, such as alcohol, caffeine, smoking, or large meals. Talk to your health care provider if you have trouble falling asleep or staying asleep. This information is not intended to replace advice given to you by your health care provider. Make sure you discuss any questions you have with your health care provider. Document Revised: 04/01/2022 Document Reviewed: 04/01/2022 Elsevier Patient Education  2024 Elsevier Inc.  

## 2023-11-05 LAB — HM PAP SMEAR: HPV, high-risk: NEGATIVE

## 2023-11-16 ENCOUNTER — Ambulatory Visit: Payer: No Typology Code available for payment source | Admitting: Neurology

## 2023-11-16 DIAGNOSIS — R29818 Other symptoms and signs involving the nervous system: Secondary | ICD-10-CM

## 2023-11-16 DIAGNOSIS — J680 Bronchitis and pneumonitis due to chemicals, gases, fumes and vapors: Secondary | ICD-10-CM

## 2023-11-16 DIAGNOSIS — G4733 Obstructive sleep apnea (adult) (pediatric): Secondary | ICD-10-CM

## 2023-11-16 DIAGNOSIS — E66813 Obesity, class 3: Secondary | ICD-10-CM

## 2023-11-16 DIAGNOSIS — J329 Chronic sinusitis, unspecified: Secondary | ICD-10-CM

## 2023-11-16 DIAGNOSIS — R0683 Snoring: Secondary | ICD-10-CM

## 2023-12-02 NOTE — Progress Notes (Signed)
[facility]  Cynthia Little Female, 32 y.o., [birth year] MRN: [id number]   HOME SLEEP TEST REPORT ( by Watch PAT)   STUDY DATE:  12-02-2023 DOB:   MRN:    ORDERING CLINICIAN: Melvyn Novas, MD  REFERRING CLINICIAN: [facility] Referred by Ned Clines, NP    CLINICAL INFORMATION/HISTORY: seen 10-19-2023. 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 Sinusitis frequent. Causing headaches, non- seasonal. Braces in place. Nasal congestion.  Chief concern according to patient :  " I am snoring and suspected to have OSA, had several years ago 04-10-2020 a PSG at the [facility] hospital which was inconclusive" .   Loud snoring.  Functional macroglossia,  wearing braces. In the setting of obesity , lost 15 pounds since April.  Goal is 220.      2) Snoring-  frequently having congestion and sinusitis,  and during these spells she is a mouth breather, wakes with morning headaches and dry mouth,  It was during her service in [place] , that she developed  sinus irritation, bronchitis, exposure to burn -pits.    3) night sweats - feeling drenched, not associated with palpitations. No menstrual irregularity.     Epworth sleepiness score:14/ 24 points ; FSS endorsed at 23/ 63 points.    BMI: 42.5 kg/m   Neck Circumference: 17"   FINDINGS:   Sleep Summary:   Total Recording Time (hours, min):    4 hours 30 minutes   Total Sleep Time (hours, min):     3 hours 47 minutes            Percent REM (%):   19%                                     Respiratory Indices:   Calculated pAHI (per hour): 10.1/h                           REM pAHI:   16.9/h                                              NREM pAHI:     8.5/h                         Positional AHI:   The patient  slept about 200 minutes in supine associated with an AHI of 9.6/h and 28 minutes on her right side with an AHI of 13/h.  Snoring level reached a mean volume of 41 dB and was present for 26% of total sleep time.                                                Oxygen Saturation Statistics:   Oxygen Saturation (%) Mean:    95%  O2 Saturation Range (%): Between a nadir at 89 and a maximum of 98%.                                     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  There was no significant sleep hypoxia, no sleep related sinus bradycardia, no central events.    RECOMMENDATION: Sleep dependent apnea responds very well to weight loss at the patient is in the process of losing more weight.  Until she hopefully reaches a BMI of 32 or less I would recommend the use of positive airway pressure therapy.  I would like to set the expectation that sleep apnea treatment does not necessarily improve insomnia, but the overall quality of sleep should improve.  I will order a auto titration CPAP device with a setting between 5 cm water pressure and 15 cm water pressure with 1 cm EPR, heated humidification and an interface to be fitted by the durable medical equipment company with special attention to the patient's dentalbraces.    INTERPRETING PHYSICIAN:   Melvyn Novas, MD [facility] and [facility] Sleep Board certified by The [employer] of Sleep Medicine and Diplomate of the [employer] of Sleep Medicine. Board certified In Neurology through the ABPN, Fellow of the [employer] of Neurology.

## 2023-12-06 NOTE — Procedures (Signed)
[facility]  Lucious Groves Female, 32 y.o., Jan 17, 1991 MRN: [id number]   HOME SLEEP TEST REPORT ( by Watch PAT)   STUDY DATE:  12-02-2023 DOB:   MRN:    ORDERING CLINICIAN: Melvyn Novas, MD  REFERRING CLINICIAN: [facility] Referred by Ned Clines, NP    CLINICAL INFORMATION/HISTORY: seen 10-19-2023. 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 Sinusitis frequent. Causing headaches, non- seasonal. Braces in place. Nasal congestion.  Chief concern according to patient :  " I am snoring and suspected to have OSA, had several years ago 04-10-2020 a PSG at the [facility] hospital which was inconclusive" .   Loud snoring.  Functional macroglossia,  wearing braces. In the setting of obesity , lost 15 pounds since April.  Goal is 220.      2) Snoring-  frequently having congestion and sinusitis,  and during these spells she is a mouth breather, wakes with morning headaches and dry mouth,  It was during her service in [place] , that she developed  sinus irritation, bronchitis, exposure to burn -pits.    3) night sweats - feeling drenched, not associated with palpitations. No menstrual irregularity.     Epworth sleepiness score:14/ 24 points ; FSS endorsed at 23/ 63 points.    BMI: 42.5 kg/m   Neck Circumference: 17"   FINDINGS:   Sleep Summary:   Total Recording Time (hours, min):    4 hours 30 minutes   Total Sleep Time (hours, min):     3 hours 47 minutes            Percent REM (%):   19%                                     Respiratory Indices:   Calculated pAHI (per hour): 10.1/h                           REM pAHI:   16.9/h                                              NREM pAHI:     8.5/h                         Positional AHI:   The patient slept  about 200 minutes in supine associated with an AHI of 9.6/h and 28 minutes on her right side with an AHI of 13/h.  Snoring level reached a mean volume of 41 dB and was present for 26% of total sleep time.                                                Oxygen Saturation Statistics:   Oxygen Saturation (%) Mean:    95%                 O2 Saturation  Range (%): Between a nadir at 89 and a maximum of 98%.                                     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  There was no significant sleep hypoxia, no sleep related sinus bradycardia, no central events.    RECOMMENDATION: Sleep dependent apnea responds very well to weight loss at the patient is in the process of losing more weight.  Until she hopefully reaches a BMI of 32 or less I would recommend the use of positive airway pressure therapy.  I would like to set the expectation that sleep apnea treatment does not necessarily improve insomnia, but the overall quality of sleep should improve.  I will order a auto titration CPAP device with a setting between 5 cm water pressure and 15 cm water pressure with 1 cm EPR, heated humidification and an interface to be fitted by the durable medical equipment company with special attention to the patient's dentalbraces.    INTERPRETING PHYSICIAN:   Melvyn Novas, MD [facility] and [place] Sleep Board certified by The [employer] of Sleep Medicine and Diplomate of the [employer] of Sleep Medicine. Board certified In Neurology through the ABPN, Fellow of the [employer] of Neurology.

## 2023-12-06 NOTE — Addendum Note (Signed)
Addended by: Melvyn Novas on: 12/06/2023 08:54 AM   Modules accepted: Orders

## 2023-12-07 ENCOUNTER — Telehealth: Payer: Self-pay | Admitting: Neurology

## 2023-12-07 NOTE — Telephone Encounter (Signed)
I called pt. I advised pt that Dr. Vickey Huger reviewed their sleep study results and found that pt has mild sleep apnea. Dr. Vickey Huger recommends that pt continue working with weight loss and auto CPAP. Pt would not like to pursue CPAP for treatment. Advised I would send the information to the referring [facility] provider. Pt verbalized understanding. Pt had no questions at this time but was encouraged to call back if questions arise.

## 2023-12-07 NOTE — Telephone Encounter (Signed)
-----   Message from Cabot Dohmeier sent at 12/06/2023  8:54 AM EST ----- [facility] -Referred by Ezequiel Ganser D, NP   Mild OSA, uncomplicated. REM sleep dependent . Rec CPAP and weight loss, weight loss may make CPAP unnecessary in the future, will recheck HST after weight loss in 12 months.  I will order a auto titration CPAP device with a setting between 5 cm water pressure and 15 cm water pressure with 1 cm EPR, heated humidification and an interface to be fitted by the durable medical equipment company with special attention to the patient's dental-braces.

## 2023-12-09 ENCOUNTER — Encounter: Payer: Self-pay | Admitting: Family Medicine

## 2023-12-09 ENCOUNTER — Ambulatory Visit (INDEPENDENT_AMBULATORY_CARE_PROVIDER_SITE_OTHER): Payer: Commercial Managed Care - PPO | Admitting: Family Medicine

## 2023-12-09 ENCOUNTER — Telehealth: Payer: Self-pay | Admitting: Family Medicine

## 2023-12-09 VITALS — BP 114/78 | HR 75 | Temp 98.6°F | Ht 64.0 in | Wt 244.6 lb

## 2023-12-09 DIAGNOSIS — J0140 Acute pansinusitis, unspecified: Secondary | ICD-10-CM

## 2023-12-09 DIAGNOSIS — T3695XA Adverse effect of unspecified systemic antibiotic, initial encounter: Secondary | ICD-10-CM

## 2023-12-09 DIAGNOSIS — B379 Candidiasis, unspecified: Secondary | ICD-10-CM

## 2023-12-09 MED ORDER — AZITHROMYCIN 250 MG PO TABS: ORAL_TABLET | ORAL | 0 refills | Status: AC

## 2023-12-09 MED ORDER — FLUCONAZOLE 150 MG PO TABS: ORAL_TABLET | ORAL | 0 refills | Status: DC

## 2023-12-09 NOTE — Progress Notes (Signed)
Established Patient Office Visit   Subjective  Patient ID: Cynthia Little, female    DOB: 12-15-1991  Age: 32 y.o. MRN: [id number]  Chief Complaint  Patient presents with   Cough    Cough drainage, sinus headache, sore throat yellow mucus, ear pain, started a day ago     Pt is a 32 yo female seen for acute illness.  Pt with sinus drainage, HA, pressure in face, cough, yellow mucus for several days.  Developed ear pain last night. Throat was a little sore.  Denies fever, chills, n/v, diarrhea.    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, gases, fumes and vapors (HCC) 10/19/2023   Suspected sleep apnea 01/14/2023   Class 3 severe obesity without serious comorbidity with body mass index (BMI) of 40.0 to 44.9 in adult [facility]) 12/17/2020   Vitamin D deficiency 12/17/2020   Tenderness 06/21/2020   Past Medical History:  Diagnosis Date   Anemia    Asthma    Bronchitis    Past Surgical History:  Procedure Laterality Date   CALCANEAL OSTEOTOMY Left 05/17/2019   Procedure: CALCANEAL OSTECTOMY;  Surgeon: Park Liter, DPM;  Location: [facility];  Service: Podiatry;  Laterality: Left;   CESAREAN SECTION  08/11/2012   Procedure: CESAREAN SECTION;  Surgeon: Bing Plume, MD;  Location: [facility];  Service: Gynecology;  Laterality: N/A;   CHEILECTOMY Left 05/17/2019   Procedure: CHEILECTOMY LEFT FOOT;  Surgeon: Park Liter, DPM;  Location: [facility];  Service: Podiatry;  Laterality: Left;   GASTROC RECESSION EXTREMITY Left 05/17/2019   Procedure: GASTROCNEMIUS RECESS;  Surgeon: Park Liter, DPM;  Location: [facility];  Service: Podiatry;  Laterality: Left;   HAMMER TOE SURGERY Left 05/17/2019   Procedure: HAMMER TOE CORRECTION FIFTH;  Surgeon: Park Liter, DPM;  Location: [facility];   Service: Podiatry;  Laterality: Left;   MINOR CLOSED MANIPULATION Left 05/17/2019   Procedure: MINOR CLOSED MANIPULATION;  Surgeon: Park Liter, DPM;  Location: [facility];  Service: Podiatry;  Laterality: Left;   NO PAST SURGERIES     Social History   Tobacco Use   Smoking status: Never   Smokeless tobacco: Never  Vaping Use   Vaping status: Never Used  Substance Use Topics   Alcohol use: No    Comment: rare   Drug use: No   Family History  Problem Relation Age of Onset   Diabetes Father    Allergies  Allergen Reactions   Cephalexin Hives and Other (See Comments)    Hyperventilate       ROS Negative unless stated above    Objective:     BP 114/78 (BP Location: Right Arm, Patient Position: Sitting, Cuff Size: Large)   Pulse 75   Temp 98.6 F (37 C) (Oral)   Ht 5\' 4"  (1.626 m)   Wt 244 lb 9.6 oz (110.9 kg)   LMP 11/01/2023   SpO2 99%   BMI 41.99 kg/m  BP Readings from Last 3 Encounters:  12/09/23 114/78  10/19/23 112/73  10/11/23 102/70   Wt Readings from Last 3 Encounters:  12/09/23 244 lb 9.6 oz (110.9 kg)  10/19/23 248 lb (112.5 kg)  10/11/23 252 lb (114.3 kg)      Physical Exam Constitutional:      General: She  is not in acute distress.    Appearance: Normal appearance.  HENT:     Head: Normocephalic and atraumatic.     Right Ear: Tympanic membrane, ear canal and external ear normal.     Left Ear: Tympanic membrane, ear canal and external ear normal.     Ears:     Comments: B/l TMs full.    Nose: Congestion present.     Right Turbinates: Enlarged and swollen.     Left Turbinates: Enlarged and swollen.     Right Sinus: Maxillary sinus tenderness and frontal sinus tenderness present.     Left Sinus: Maxillary sinus tenderness and frontal sinus tenderness present.     Mouth/Throat:     Mouth: Mucous membranes are moist.  Cardiovascular:     Rate and Rhythm: Normal rate and regular rhythm.     Heart sounds: Normal heart  sounds. No murmur heard.    No gallop.  Pulmonary:     Effort: Pulmonary effort is normal. No respiratory distress.     Breath sounds: Normal breath sounds. No wheezing, rhonchi or rales.  Skin:    General: Skin is warm and dry.  Neurological:     Mental Status: She is alert and oriented to person, place, and time.      No results found for any visits on 12/09/23.    Assessment & Plan:  Acute pansinusitis, recurrence not specified -     Azithromycin; Take 2 tablets on day 1, then 1 tablet daily on days 2 through 5  Dispense: 6 tablet; Refill: 0  Antibiotic-induced yeast infection -     Fluconazole; Take one tab now.  Repeat dose in three days if needed.  Dispense: 2 tablet; Refill: 0  Start abx for sinusitis.  Allergy to Keflex.  Can also use OTC flonase and po antihistamine, tylenol or NSAIDs prn.  Diflucan prn for abx induced yeast infection.  Return if symptoms worsen or fail to improve.   Deeann Saint, MD

## 2023-12-09 NOTE — Telephone Encounter (Signed)
Disregard

## 2023-12-10 ENCOUNTER — Ambulatory Visit: Payer: No Typology Code available for payment source | Admitting: Family Medicine

## 2023-12-24 ENCOUNTER — Ambulatory Visit: Payer: Commercial Managed Care - PPO | Admitting: Family Medicine

## 2023-12-30 ENCOUNTER — Ambulatory Visit: Payer: No Typology Code available for payment source | Admitting: Family Medicine

## 2024-09-15 ENCOUNTER — Telehealth: Payer: Self-pay | Admitting: Family Medicine

## 2024-09-15 ENCOUNTER — Ambulatory Visit: Admitting: Family Medicine

## 2024-09-15 ENCOUNTER — Encounter: Payer: Self-pay | Admitting: Family Medicine

## 2024-09-15 VITALS — BP 118/80 | HR 85 | Temp 99.2°F | Ht 64.0 in | Wt 240.8 lb

## 2024-09-15 DIAGNOSIS — B379 Candidiasis, unspecified: Secondary | ICD-10-CM

## 2024-09-15 DIAGNOSIS — T3695XA Adverse effect of unspecified systemic antibiotic, initial encounter: Secondary | ICD-10-CM

## 2024-09-15 DIAGNOSIS — J0120 Acute ethmoidal sinusitis, unspecified: Secondary | ICD-10-CM | POA: Diagnosis not present

## 2024-09-15 MED ORDER — AZITHROMYCIN 250 MG PO TABS: ORAL_TABLET | ORAL | 0 refills | Status: AC

## 2024-09-15 MED ORDER — FLUCONAZOLE 150 MG PO TABS: ORAL_TABLET | ORAL | 0 refills | Status: DC

## 2024-09-15 NOTE — Telephone Encounter (Signed)
 Copied from CRM [id number]. Topic: General - Other >> Sep 15, 2024 11:07 AM Thersia BROCKS wrote: Reason for CRM: Patient called in stated she would like a doctor note for work,  would like call when she is able to pick it up

## 2024-09-15 NOTE — Progress Notes (Signed)
 Established Patient Office Visit   Subjective  Patient ID: Cynthia Little, female    DOB: 1991/01/22  Age: 33 y.o. MRN: [id number]  Chief Complaint  Patient presents with   Acute Visit    Sinus pressure, headaches, congestion, fever, started 5 days ago, patient tested neg for COVID (at home testing)    Pt is a 33 yo female seem for acute concern.  Pt with nasal congestion, suffiness, HA, elevated temp, ear pressure, facial pressure. Denies cough, ST, rhinorrhea, wheezing x 5 d.  Taking Allegra and flonase.  Home COVID test neg.    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, gases, fumes and vapors (HCC) 10/19/2023   Suspected sleep apnea 01/14/2023   Class 3 severe obesity without serious comorbidity with body mass index (BMI) of 40.0 to 44.9 in adult 12/17/2020   Vitamin D  deficiency 12/17/2020   Tenderness 06/21/2020   Past Medical History:  Diagnosis Date   Anemia    Asthma    Bronchitis    Past Surgical History:  Procedure Laterality Date   CALCANEAL OSTEOTOMY Left 05/17/2019   Procedure: CALCANEAL OSTECTOMY;  Surgeon: Gretel Ozell PARAS, DPM;  Location: [facility];  Service: Podiatry;  Laterality: Left;   CESAREAN SECTION  08/11/2012   Procedure: CESAREAN SECTION;  Surgeon: Debby JULIANNA Lares, MD;  Location: [facility];  Service: Gynecology;  Laterality: N/A;   CHEILECTOMY Left 05/17/2019   Procedure: CHEILECTOMY LEFT FOOT;  Surgeon: Gretel Ozell PARAS, DPM;  Location: [facility];  Service: Podiatry;  Laterality: Left;   GASTROC RECESSION EXTREMITY Left 05/17/2019   Procedure: GASTROCNEMIUS RECESS;  Surgeon: Gretel Ozell PARAS, DPM;  Location: [facility];  Service: Podiatry;  Laterality: Left;   HAMMER TOE SURGERY Left 05/17/2019   Procedure: HAMMER TOE CORRECTION FIFTH;  Surgeon: Gretel Ozell PARAS, DPM;   Location: [facility];  Service: Podiatry;  Laterality: Left;   MINOR CLOSED MANIPULATION Left 05/17/2019   Procedure: MINOR CLOSED MANIPULATION;  Surgeon: Gretel Ozell PARAS, DPM;  Location: [facility];  Service: Podiatry;  Laterality: Left;   NO PAST SURGERIES     Social History   Tobacco Use   Smoking status: Never   Smokeless tobacco: Never  Vaping Use   Vaping status: Never Used  Substance Use Topics   Alcohol use: No    Comment: rare   Drug use: No   Family History  Problem Relation Age of Onset   Diabetes Father    Allergies  Allergen Reactions   Cephalexin Hives and Other (See Comments)    Hyperventilate     ROS Negative unless stated above    Objective:     BP 118/80 (BP Location: Left Arm, Patient Position: Sitting, Cuff Size: Large)   Pulse 85   Temp 99.2 F (37.3 C) (Oral)   Ht 5' 4 (1.626 m)   Wt 240 lb 12.8 oz (109.2 kg)   LMP  (LMP Unknown)   SpO2 100%   BMI 41.33 kg/m  BP Readings from Last 3 Encounters:  09/15/24 118/80  12/09/23 114/78  10/19/23 112/73   Wt Readings from Last 3 Encounters:  09/15/24 240 lb 12.8 oz (109.2 kg)  12/09/23 244 lb 9.6 oz (110.9 kg)  10/19/23 248 lb (112.5 kg)      Physical Exam Constitutional:  General: She is not in acute distress.    Appearance: Normal appearance.  HENT:     Head: Normocephalic and atraumatic.     Ears:     Comments: B/l TMs full    Nose: Nose normal.     Comments: TTP of bridge of nose.    Mouth/Throat:     Mouth: Mucous membranes are moist.  Cardiovascular:     Rate and Rhythm: Normal rate and regular rhythm.     Heart sounds: Normal heart sounds. No murmur heard.    No gallop.  Pulmonary:     Effort: Pulmonary effort is normal. No respiratory distress.     Breath sounds: Examination of the right-upper field reveals wheezing. Wheezing present. No rhonchi or rales.     Comments: Faint wheeze in Posterior R upper lobe. Skin:    General: Skin is  warm and dry.  Neurological:     Mental Status: She is alert and oriented to person, place, and time.        09/15/2024    8:53 AM 04/09/2023    4:51 PM 12/07/2022    4:03 PM  Depression screen PHQ 2/9  Decreased Interest 0 0 0  Down, Depressed, Hopeless 0 0 0  PHQ - 2 Score 0 0 0  Altered sleeping 3  2  Tired, decreased energy 2  3  Change in appetite 1  2  Feeling bad or failure about yourself  0  0  Trouble concentrating 0  0  Moving slowly or fidgety/restless 0  0  Suicidal thoughts 0  0  PHQ-9 Score 6  7  Difficult doing work/chores Not difficult at all  Somewhat difficult      09/15/2024    8:53 AM  GAD 7 : Generalized Anxiety Score  Nervous, Anxious, on Edge 2  Control/stop worrying 2  Worry too much - different things 2  Trouble relaxing 3  Restless 2  Easily annoyed or irritable 1  Afraid - awful might happen 0  Total GAD 7 Score 12  Anxiety Difficulty Very difficult     No results found for any visits on 09/15/24.    Assessment & Plan:   Acute ethmoidal sinusitis, recurrence not specified -     Azithromycin ; Take 2 tablets on day 1, then 1 tablet daily on days 2 through 5  Dispense: 6 tablet; Refill: 0  Antibiotic-induced yeast infection -     Fluconazole ; Take 1 tab now.  Repeat dose in 3 days if needed.  Dispense: 2 tablet; Refill: 0  Acute sinusitis.  Start abx.  Keflex allergy.  Contnue supportive care with flonase, antihistamine, OTC cough/cold meds.  Diflucan  for h/o abx induced vaginitis.  No follow-ups on file.   Clotilda JONELLE Single, MD

## 2024-09-15 NOTE — Telephone Encounter (Signed)
Patient has picked up note.

## 2024-10-13 ENCOUNTER — Ambulatory Visit (INDEPENDENT_AMBULATORY_CARE_PROVIDER_SITE_OTHER): Admitting: Family Medicine

## 2024-10-13 ENCOUNTER — Encounter: Payer: Self-pay | Admitting: Family Medicine

## 2024-10-13 VITALS — BP 104/78 | HR 88 | Temp 98.4°F | Ht 64.0 in | Wt 242.2 lb

## 2024-10-13 DIAGNOSIS — Z23 Encounter for immunization: Secondary | ICD-10-CM

## 2024-10-13 DIAGNOSIS — E559 Vitamin D deficiency, unspecified: Secondary | ICD-10-CM | POA: Diagnosis not present

## 2024-10-13 DIAGNOSIS — Z0000 Encounter for general adult medical examination without abnormal findings: Secondary | ICD-10-CM

## 2024-10-13 DIAGNOSIS — J302 Other seasonal allergic rhinitis: Secondary | ICD-10-CM

## 2024-10-13 DIAGNOSIS — Z13220 Encounter for screening for lipoid disorders: Secondary | ICD-10-CM | POA: Diagnosis not present

## 2024-10-13 DIAGNOSIS — Z131 Encounter for screening for diabetes mellitus: Secondary | ICD-10-CM | POA: Diagnosis not present

## 2024-10-13 LAB — COMPREHENSIVE METABOLIC PANEL WITH GFR
ALT: 14 U/L (ref 0–35)
AST: 16 U/L (ref 0–37)
Albumin: 4.4 g/dL (ref 3.5–5.2)
Alkaline Phosphatase: 50 U/L (ref 39–117)
BUN: 8 mg/dL (ref 6–23)
CO2: 20 meq/L (ref 19–32)
Calcium: 9.2 mg/dL (ref 8.4–10.5)
Chloride: 109 meq/L (ref 96–112)
Creatinine, Ser: 0.99 mg/dL (ref 0.40–1.20)
GFR: 73.75 mL/min (ref >60.00)
Glucose, Bld: 90 mg/dL (ref 70–99)
Potassium: 3.9 meq/L (ref 3.5–5.1)
Sodium: 138 meq/L (ref 135–145)
Total Bilirubin: 0.6 mg/dL (ref 0.2–1.2)
Total Protein: 7.6 g/dL (ref 6.0–8.3)

## 2024-10-13 LAB — LIPID PANEL
Cholesterol: 147 mg/dL (ref 0–200)
HDL: 37 mg/dL — ABNORMAL LOW (ref >39.00)
LDL Cholesterol: 92 mg/dL (ref 0–99)
NonHDL: 110.08
Total CHOL/HDL Ratio: 4
Triglycerides: 90 mg/dL (ref 0.0–149.0)
VLDL: 18 mg/dL (ref 0.0–40.0)

## 2024-10-13 LAB — CBC WITH DIFFERENTIAL/PLATELET
Basophils Absolute: 0.1 K/uL (ref 0.0–0.1)
Basophils Relative: 0.8 % (ref 0.0–3.0)
Eosinophils Absolute: 0.1 K/uL (ref 0.0–0.7)
Eosinophils Relative: 1.5 % (ref 0.0–5.0)
HCT: 38 % (ref 36.0–46.0)
Hemoglobin: 12.5 g/dL (ref 12.0–15.0)
Lymphocytes Relative: 26 % (ref 12.0–46.0)
Lymphs Abs: 1.9 K/uL (ref 0.7–4.0)
MCHC: 33 g/dL (ref 30.0–36.0)
MCV: 78.9 fl (ref 78.0–100.0)
Monocytes Absolute: 0.2 K/uL (ref 0.1–1.0)
Monocytes Relative: 3.2 % (ref 3.0–12.0)
Neutro Abs: 5.1 K/uL (ref 1.4–7.7)
Neutrophils Relative %: 68.5 % (ref 43.0–77.0)
Platelets: 281 K/uL (ref 150.0–400.0)
RBC: 4.82 Mil/uL (ref 3.87–5.11)
RDW: 14.7 % (ref 11.5–15.5)
WBC: 7.5 K/uL (ref 4.0–10.5)

## 2024-10-13 LAB — VITAMIN D 25 HYDROXY (VIT D DEFICIENCY, FRACTURES): VITD: 49.53 ng/mL (ref 30.00–100.00)

## 2024-10-13 LAB — TSH: TSH: 1.49 u[IU]/mL (ref 0.35–5.50)

## 2024-10-13 LAB — HEMOGLOBIN A1C: Hgb A1c MFr Bld: 5.6 % (ref 4.6–6.5)

## 2024-10-13 LAB — T4, FREE: Free T4: 0.63 ng/dL (ref 0.60–1.60)

## 2024-10-13 MED ORDER — MONTELUKAST SODIUM 10 MG PO TABS: 10.0000 mg | ORAL_TABLET | Freq: Every day | ORAL | 3 refills | Status: AC

## 2024-10-13 NOTE — Progress Notes (Signed)
 Established Patient Office Visit   Subjective  Patient ID: Cynthia Little, female    DOB: 04-14-1991  Age: 33 y.o. MRN: [id number]  No chief complaint on file.   Pt is a 33 yo female seen for CPE.  Pt states she is ok.  Having issues with allergies.  Allegra helps, but does not fully relieve symptoms. Course of Azithromycin  helped some.  Also using flonase.    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, gases, fumes and vapors (HCC) 10/19/2023   Suspected sleep apnea 01/14/2023   Class 3 severe obesity without serious comorbidity with body mass index (BMI) of 40.0 to 44.9 in adult [facility]) 12/17/2020   Vitamin D  deficiency 12/17/2020   Tenderness 06/21/2020   Past Medical History:  Diagnosis Date   Anemia    Anxiety 08/21/21   Asthma    Bronchitis    Past Surgical History:  Procedure Laterality Date   CALCANEAL OSTEOTOMY Left 05/17/2019   Procedure: CALCANEAL OSTECTOMY;  Surgeon: Gretel Ozell PARAS, DPM;  Location: [facility];  Service: Podiatry;  Laterality: Left;   CESAREAN SECTION  08/11/2012   Procedure: CESAREAN SECTION;  Surgeon: Debby JULIANNA Lares, MD;  Location: [facility];  Service: Gynecology;  Laterality: N/A;   CHEILECTOMY Left 05/17/2019   Procedure: CHEILECTOMY LEFT FOOT;  Surgeon: Gretel Ozell PARAS, DPM;  Location: [facility];  Service: Podiatry;  Laterality: Left;   GASTROC RECESSION EXTREMITY Left 05/17/2019   Procedure: GASTROCNEMIUS RECESS;  Surgeon: Gretel Ozell PARAS, DPM;  Location: [facility];  Service: Podiatry;  Laterality: Left;   HAMMER TOE SURGERY Left 05/17/2019   Procedure: HAMMER TOE CORRECTION FIFTH;  Surgeon: Gretel Ozell PARAS, DPM;  Location: [facility];  Service: Podiatry;  Laterality: Left;   MINOR CLOSED MANIPULATION Left 05/17/2019   Procedure: MINOR CLOSED  MANIPULATION;  Surgeon: Gretel Ozell PARAS, DPM;  Location: [facility];  Service: Podiatry;  Laterality: Left;   NO PAST SURGERIES     Social History   Tobacco Use   Smoking status: Never   Smokeless tobacco: Never  Vaping Use   Vaping status: Never Used  Substance Use Topics   Alcohol use: No    Comment: rare   Drug use: No   Family History  Problem Relation Age of Onset   Diabetes Mother    Diabetes Father    Allergies  Allergen Reactions   Cephalexin Hives and Other (See Comments)    Hyperventilate     ROS Negative unless stated above    Objective:     LMP  (LMP Unknown)  BP Readings from Last 3 Encounters:  09/15/24 118/80  12/09/23 114/78  10/19/23 112/73   Wt Readings from Last 3 Encounters:  09/15/24 240 lb 12.8 oz (109.2 kg)  12/09/23 244 lb 9.6 oz (110.9 kg)  10/19/23 248 lb (112.5 kg)      Physical Exam Constitutional:      Appearance: Normal appearance.     Comments: Becomes tearful.  HENT:     Head: Normocephalic and atraumatic.     Right Ear: Tympanic membrane, ear canal and external ear normal.     Left Ear: Tympanic membrane, ear canal and external ear normal.     Nose: Nose normal.     Mouth/Throat:     Mouth: Mucous membranes are moist.  Pharynx: No oropharyngeal exudate or posterior oropharyngeal erythema.  Eyes:     General: No scleral icterus.    Extraocular Movements: Extraocular movements intact.     Conjunctiva/sclera: Conjunctivae normal.     Pupils: Pupils are equal, round, and reactive to light.  Neck:     Thyroid: No thyromegaly.     Vascular: No carotid bruit.  Cardiovascular:     Rate and Rhythm: Normal rate and regular rhythm.     Pulses: Normal pulses.     Heart sounds: Normal heart sounds. No murmur heard.    No friction rub.  Pulmonary:     Effort: Pulmonary effort is normal.     Breath sounds: Normal breath sounds. No wheezing, rhonchi or rales.  Abdominal:     General: Bowel sounds are  normal.     Palpations: Abdomen is soft.     Tenderness: There is no abdominal tenderness.  Musculoskeletal:        General: No deformity. Normal range of motion.  Lymphadenopathy:     Cervical: No cervical adenopathy.  Skin:    General: Skin is warm and dry.     Findings: No lesion.  Neurological:     General: No focal deficit present.     Mental Status: She is alert and oriented to person, place, and time.  Psychiatric:        Mood and Affect: Mood normal.        Thought Content: Thought content normal.        09/15/2024    8:53 AM 04/09/2023    4:51 PM 12/07/2022    4:03 PM  Depression screen PHQ 2/9  Decreased Interest 0 0 0  Down, Depressed, Hopeless 0 0 0  PHQ - 2 Score 0 0 0  Altered sleeping 3  2  Tired, decreased energy 2  3  Change in appetite 1  2  Feeling bad or failure about yourself  0  0  Trouble concentrating 0  0  Moving slowly or fidgety/restless 0  0  Suicidal thoughts 0  0  PHQ-9 Score 6  7  Difficult doing work/chores Not difficult at all  Somewhat difficult      09/15/2024    8:53 AM  GAD 7 : Generalized Anxiety Score  Nervous, Anxious, on Edge 2  Control/stop worrying 2  Worry too much - different things 2  Trouble relaxing 3  Restless 2  Easily annoyed or irritable 1  Afraid - awful might happen 0  Total GAD 7 Score 12  Anxiety Difficulty Very difficult     No results found for any visits on 10/13/24.    Assessment & Plan:   Well adult exam -     CBC with Differential/Platelet; Future -     Comprehensive metabolic panel with GFR; Future -     Hemoglobin A1c; Future -     Lipid panel; Future -     T4, free; Future -     TSH; Future  Need for influenza vaccination -     Flu vaccine trivalent PF, 6mos and older(Flulaval,Afluria,Fluarix,Fluzone); Future  Seasonal allergies -     Montelukast Sodium; Take 1 tablet (10 mg total) by mouth at bedtime.  Dispense: 30 tablet; Refill: 3  Vitamin D  deficiency -     VITAMIN D  25 Hydroxy  (Vit-D Deficiency, Fractures); Future  Age appropriate health screenings discussed.  Obtain labs.  Immunizations reviewed.  Influenza vaccine given this visit.  Tdap up-to-date done 09/10/2021.  Pap up-to-date done 11/04/2023 with OB/GYN.  Mammogram and colonoscopy not yet indicated 2/2 age.  Start Singulair for continued allergy symptoms.  If needed can also use Allegra and Flonase with it.  For continued symptoms referral to allergist.  Next CPE in 1 year.  Return in about 1 year (around 10/13/2025) for physical.  f/u in 3 months for allergies.   Clotilda JONELLE Single, MD

## 2024-10-20 ENCOUNTER — Ambulatory Visit: Payer: Self-pay | Admitting: Family Medicine

## 2024-12-11 ENCOUNTER — Ambulatory Visit (HOSPITAL_COMMUNITY)

## 2024-12-11 ENCOUNTER — Ambulatory Visit
